# Patient Record
Sex: Female | Born: 1985 | Race: Black or African American | Hispanic: No | Marital: Married | Smoking: Never smoker
Health system: Southern US, Community
[De-identification: ages and names within clinical notes are randomized; demographics above are authoritative.]

## PROBLEM LIST (undated history)

## (undated) ENCOUNTER — Inpatient Hospital Stay (HOSPITAL_COMMUNITY): Payer: Self-pay

## (undated) DIAGNOSIS — A4902 Methicillin resistant Staphylococcus aureus infection, unspecified site: Secondary | ICD-10-CM

## (undated) DIAGNOSIS — F419 Anxiety disorder, unspecified: Secondary | ICD-10-CM

## (undated) DIAGNOSIS — F1920 Other psychoactive substance dependence, uncomplicated: Secondary | ICD-10-CM

## (undated) DIAGNOSIS — N83209 Unspecified ovarian cyst, unspecified side: Secondary | ICD-10-CM

## (undated) DIAGNOSIS — N200 Calculus of kidney: Secondary | ICD-10-CM

## (undated) DIAGNOSIS — K759 Inflammatory liver disease, unspecified: Secondary | ICD-10-CM

## (undated) HISTORY — PX: OTHER SURGICAL HISTORY: SHX169

## (undated) HISTORY — PX: TOOTH EXTRACTION: SUR596

## (undated) HISTORY — DX: Other psychoactive substance dependence, uncomplicated: F19.20

---

## 2015-08-24 ENCOUNTER — Emergency Department (HOSPITAL_COMMUNITY)
Admission: EM | Admit: 2015-08-24 | Discharge: 2015-08-24 | Disposition: A | Discharge status: Home / Self Care | Payer: Medicaid - Out of State | Attending: Emergency Medicine | Admitting: Emergency Medicine

## 2015-08-24 ENCOUNTER — Emergency Department (HOSPITAL_COMMUNITY): Payer: Medicaid - Out of State

## 2015-08-24 ENCOUNTER — Encounter (HOSPITAL_COMMUNITY): Payer: Self-pay

## 2015-08-24 DIAGNOSIS — R740 Nonspecific elevation of levels of transaminase and lactic acid dehydrogenase [LDH]: Secondary | ICD-10-CM | POA: Insufficient documentation

## 2015-08-24 DIAGNOSIS — Z3202 Encounter for pregnancy test, result negative: Secondary | ICD-10-CM | POA: Diagnosis not present

## 2015-08-24 DIAGNOSIS — R1011 Right upper quadrant pain: Secondary | ICD-10-CM | POA: Diagnosis present

## 2015-08-24 DIAGNOSIS — R350 Frequency of micturition: Secondary | ICD-10-CM | POA: Diagnosis not present

## 2015-08-24 DIAGNOSIS — Z79899 Other long term (current) drug therapy: Secondary | ICD-10-CM | POA: Diagnosis not present

## 2015-08-24 DIAGNOSIS — R7401 Elevation of levels of liver transaminase levels: Secondary | ICD-10-CM

## 2015-08-24 DIAGNOSIS — R197 Diarrhea, unspecified: Secondary | ICD-10-CM | POA: Insufficient documentation

## 2015-08-24 DIAGNOSIS — R109 Unspecified abdominal pain: Secondary | ICD-10-CM

## 2015-08-24 LAB — I-STAT BETA HCG BLOOD, ED (MC, WL, AP ONLY): I-stat hCG, quantitative: <5 m[IU]/mL (ref <5)

## 2015-08-24 LAB — CBC
HEMATOCRIT: 39.4 % (ref 36.0–46.0)
HEMOGLOBIN: 13.5 g/dL (ref 12.0–15.0)
MCH: 30.4 pg (ref 26.0–34.0)
MCHC: 34.3 g/dL (ref 30.0–36.0)
MCV: 88.7 fL (ref 78.0–100.0)
Platelets: 207 10*3/uL (ref 150–400)
RBC: 4.44 MIL/uL (ref 3.87–5.11)
RDW: 13.5 % (ref 11.5–15.5)
WBC: 12.4 10*3/uL — ABNORMAL HIGH (ref 4.0–10.5)

## 2015-08-24 LAB — URINALYSIS, ROUTINE W REFLEX MICROSCOPIC
Bilirubin Urine: NEGATIVE
GLUCOSE, UA: NEGATIVE mg/dL
HGB URINE DIPSTICK: NEGATIVE
Ketones, ur: 15 mg/dL — AB
Leukocytes, UA: NEGATIVE
Nitrite: NEGATIVE
PH: 5 (ref 5.0–8.0)
Protein, ur: NEGATIVE mg/dL
SPECIFIC GRAVITY, URINE: 1.025 (ref 1.005–1.030)

## 2015-08-24 LAB — WET PREP, GENITAL
CLUE CELLS WET PREP: NONE SEEN
SPERM: NONE SEEN
TRICH WET PREP: NONE SEEN
Yeast Wet Prep HPF POC: NONE SEEN

## 2015-08-24 LAB — COMPREHENSIVE METABOLIC PANEL
ALT: 187 U/L — AB (ref 14–54)
ANION GAP: 12 (ref 5–15)
AST: 81 U/L — ABNORMAL HIGH (ref 15–41)
Albumin: 4.4 g/dL (ref 3.5–5.0)
Alkaline Phosphatase: 87 U/L (ref 38–126)
BUN: 8 mg/dL (ref 6–20)
CHLORIDE: 106 mmol/L (ref 101–111)
CO2: 22 mmol/L (ref 22–32)
CREATININE: 0.76 mg/dL (ref 0.44–1.00)
Calcium: 9.7 mg/dL (ref 8.9–10.3)
GFR calc non Af Amer: >60 mL/min (ref >60)
Glucose, Bld: 126 mg/dL — ABNORMAL HIGH (ref 65–99)
Potassium: 3.7 mmol/L (ref 3.5–5.1)
SODIUM: 140 mmol/L (ref 135–145)
Total Bilirubin: 0.7 mg/dL (ref 0.3–1.2)
Total Protein: 8.2 g/dL — ABNORMAL HIGH (ref 6.5–8.1)

## 2015-08-24 LAB — LIPASE, BLOOD: LIPASE: 24 U/L (ref 11–51)

## 2015-08-24 MED ORDER — MORPHINE SULFATE (PF) 2 MG/ML IV SOLN
2.0000 mg | Freq: Once | INTRAVENOUS | Status: AC
  Administered 2015-08-24: 2 mg via INTRAVENOUS
  Filled 2015-08-24: qty 1

## 2015-08-24 MED ORDER — IOPAMIDOL (ISOVUE-300) INJECTION 61%
INTRAVENOUS | Status: AC
  Administered 2015-08-24: 100 mL
  Filled 2015-08-24: qty 100

## 2015-08-24 MED ORDER — MORPHINE SULFATE (PF) 4 MG/ML IV SOLN
4.0000 mg | Freq: Once | INTRAVENOUS | Status: AC
  Administered 2015-08-24: 4 mg via INTRAVENOUS
  Filled 2015-08-24: qty 1

## 2015-08-24 MED ORDER — ONDANSETRON HCL 4 MG/2ML IJ SOLN
4.0000 mg | Freq: Once | INTRAMUSCULAR | Status: AC
  Administered 2015-08-24: 4 mg via INTRAVENOUS
  Filled 2015-08-24: qty 2

## 2015-08-24 MED ORDER — OXYCODONE-ACETAMINOPHEN 5-325 MG PO TABS
1.0000 | ORAL_TABLET | ORAL | Status: DC | PRN
  Administered 2015-08-24: 1 via ORAL

## 2015-08-24 MED ORDER — OXYCODONE-ACETAMINOPHEN 5-325 MG PO TABS
ORAL_TABLET | ORAL | Status: AC
  Filled 2015-08-24: qty 1

## 2015-08-24 MED ORDER — ONDANSETRON 4 MG PO TBDP: 4.0000 mg | ORAL_TABLET | Freq: Three times a day (TID) | ORAL | Status: DC | PRN

## 2015-08-24 NOTE — ED Notes (Signed)
Pt moaning in waiting room and advised Rad Tech "she was going to die".  Went and talked to pt and she ask how much longer.  Pt alert sitting in wheelchair.

## 2015-08-24 NOTE — Discharge Instructions (Signed)
Take zofran as needed for nausea. Increase hydration. Ibuprofen for pain.  Please follow up with your primary care provider in 1 week for a re-check of your liver labs. You can also call the clinic listed today or first thing in the morning to schedule a follow up appointment on this lab work.   Please seek immediate care if you develop any of the following symptoms: The pain does not go away.  You have a fever.  You keep throwing up (vomiting) despite nausea medicine. You pass bloody or black tarry stools.  There is bright red blood in the stool.  The constipation stays for more than 4 days.  There is rectal pain.  You do not seem to be getting better.  You have any questions or concerns.

## 2015-08-24 NOTE — ED Notes (Signed)
Pt vomiting in waiting room.  Pt threw up Percocet that she was just given.

## 2015-08-24 NOTE — ED Notes (Signed)
Patient here with lower abdominal pain x 1 day. Reports multiple episodes of vomiting with same. Denies diarrhea. Tearful on arrival

## 2015-08-24 NOTE — ED Provider Notes (Signed)
CSN: [id number]     Arrival date & time 08/24/15  1033 History   First MD Initiated Contact with Patient 08/24/15 1139     Chief Complaint  Patient presents with  . Abdominal Pain    (Consider location/radiation/quality/duration/timing/severity/associated sxs/prior Treatment) The history is provided by the patient and medical records. No language interpreter was used.   Carey Lafon is a 30 y.o. female who presents to the Emergency Department complaining of abdominal pain x 3 days which began worsening this morning. Pain described as sharp, stabbing. Worse in RUQ and LLQ but pain over all abdomen. Ibuprofen - one three times a day x 3 days with little relief of pain. + nausea x 3 days, + vomiting starting today (states NBNB emesis >5 times today). Watery stool once while in ED - no blood. No dysuria, + urinary frequency. ETOH use occasionally, not daily.   History reviewed. No pertinent past medical history. History reviewed. No pertinent past surgical history. No family history on file. Social History  Substance Use Topics  . Smoking status: Never Smoker   . Smokeless tobacco: None  . Alcohol Use: None   OB History    No data available     Review of Systems  Constitutional: Negative for fever and chills.  HENT: Negative for congestion.   Eyes: Negative for visual disturbance.  Respiratory: Negative for cough and shortness of breath.   Cardiovascular: Negative.   Gastrointestinal: Positive for nausea, vomiting, abdominal pain and diarrhea (One loose stool today). Negative for blood in stool.  Genitourinary: Positive for frequency. Negative for dysuria.  Musculoskeletal: Negative for myalgias and back pain.  Skin: Negative for rash.  Neurological: Negative for dizziness and headaches.      Allergies  Review of patient's allergies indicates no known allergies.  Home Medications   Prior to Admission medications   Medication Sig Start Date End Date Taking? Authorizing  Provider  acyclovir (ZOVIRAX) 400 MG tablet Take 400 mg by mouth 2 (two) times daily.   Yes Historical Provider, MD  FLUoxetine (PROZAC) 40 MG capsule Take 40 mg by mouth 2 (two) times daily.   Yes Historical Provider, MD  ondansetron (ZOFRAN ODT) 4 MG disintegrating tablet Take 1 tablet (4 mg total) by mouth every 8 (eight) hours as needed for nausea or vomiting. 08/24/15   Chase Picket Ward, PA-C  PRESCRIPTION MEDICATION Take 1 tablet by mouth daily. The patient stated that her mother mails a blood thinner to her and she is not sure of the name of it. She doesn't know where her mother gets it from. She takes it to ensure that she doesn't get another cyst per patient.   Yes Historical Provider, MD   BP 125/66 mmHg  Pulse 78  Temp(Src) 98.4 F (36.9 C) (Oral)  Resp 22  Ht  (1.626 m)  Wt 84.823 kg  BMI 32.08 kg/m2  SpO2 98%  LMP 08/07/2015 Physical Exam  Constitutional: She is oriented to person, place, and time. She appears well-developed and well-nourished. No distress.  HENT:  Head: Normocephalic and atraumatic.  Cardiovascular: Normal rate, regular rhythm, normal heart sounds and intact distal pulses.  Exam reveals no gallop and no friction rub.   No murmur heard. Pulmonary/Chest: Effort normal and breath sounds normal. No respiratory distress. She has no wheezes. She has no rales. She exhibits no tenderness.  Abdominal: Soft. Bowel sounds are normal. She exhibits no distension and no mass. There is tenderness. There is positive Murphy's sign.  Generalized TTP,  worst in RUQ. No CVA tenderness.   Genitourinary:  Chaperone present for exam. No rashes, lesions, or tenderness to external genitalia. No erythema, injury, or tenderness to vaginal mucosa. No vaginal discharge or bleeding within vaginal vault. No adnexal masses, tenderness, or fullness. No CMT or discharge from cervical os.  Musculoskeletal: She exhibits no edema.  Neurological: She is alert and oriented to person, place,  and time.  Skin: Skin is warm and dry.  Nursing note and vitals reviewed.   ED Course  Procedures (including critical care time) Labs Review Labs Reviewed  WET PREP, GENITAL - Abnormal; Notable for the following:    WBC, Wet Prep HPF POC FEW (*)    All other components within normal limits  COMPREHENSIVE METABOLIC PANEL - Abnormal; Notable for the following:    Glucose, Bld 126 (*)    Total Protein 8.2 (*)    AST 81 (*)    ALT 187 (*)    All other components within normal limits  CBC - Abnormal; Notable for the following:    WBC 12.4 (*)    All other components within normal limits  URINALYSIS, ROUTINE W REFLEX MICROSCOPIC (NOT AT [facility]) - Abnormal; Notable for the following:    APPearance HAZY (*)    Ketones, ur 15 (*)    All other components within normal limits  LIPASE, BLOOD  I-STAT BETA HCG BLOOD, ED ([facility], [facility], [facility] ONLY)  GC/CHLAMYDIA PROBE AMP ([place]) NOT AT [facility]    Imaging Review [place] Abdomen Complete  08/24/2015  CLINICAL DATA:  Abdominal pain for 1 day with vomiting. EXAM: ABDOMEN ULTRASOUND COMPLETE COMPARISON:  None. FINDINGS: Study slightly limited by patient body habitus. Gallbladder: No gallstones seen. There is no gallbladder wall thickening, pericholecystic fluid or other secondary sonographic signs of acute cholecystitis. Common bile duct: Diameter: Normal ranging from 3-6 mm. Liver: No focal lesion identified. Within normal limits in parenchymal echogenicity. IVC: No abnormality visualized. Pancreas: Visualized portion unremarkable. Spleen: Size and appearance within normal limits. Right Kidney: Length: 10.6 cm. Echogenicity within normal limits. No mass or hydronephrosis visualized. Left Kidney: Length: 11.1 cm. Echogenicity within normal limits. No mass or hydronephrosis visualized. Abdominal aorta: No aneurysm visualized. Other findings: None. IMPRESSION: Normal abdomen ultrasound. Electronically Signed   By: Bary RichardStan  Maynard M.D.   On: 08/24/2015 13:51   Ct  Abdomen Pelvis W Contrast  08/24/2015  CLINICAL DATA:  Lower abdominal pain and vomiting for 3 days. EXAM: CT ABDOMEN AND PELVIS WITH CONTRAST TECHNIQUE: Multidetector CT imaging of the abdomen and pelvis was performed using the standard protocol following bolus administration of intravenous contrast. CONTRAST:  100mL ISOVUE-300 IOPAMIDOL (ISOVUE-300) INJECTION 61% COMPARISON:  None. FINDINGS: Lower chest: Lung bases are clear. Hepatobiliary: No focal hepatic lesion. No biliary duct dilatation. Gallbladder is normal. Common bile duct is normal. Pancreas: Pancreas is normal. No ductal dilatation. No pancreatic inflammation. Spleen: Normal spleen Adrenals/urinary tract: Adrenal glands and kidneys are normal. The ureters and bladder normal. Stomach/Bowel: Stomach, small bowel, appendix, and cecum are normal. The colon and rectosigmoid colon are normal. Vascular/Lymphatic: Abdominal aorta is normal caliber. There is no retroperitoneal or periportal lymphadenopathy. No pelvic lymphadenopathy. Reproductive: Uterus and ovaries are normal. Other: No free fluid. Musculoskeletal: No aggressive osseous lesion. IMPRESSION: 1. No acute abdominal or pelvic findings. 2. Normal appendix. Electronically Signed   By: Genevive BiStewart  Edmunds M.D.   On: 08/24/2015 14:54   I have personally reviewed and evaluated these images and lab results as part of my medical decision-making.  EKG Interpretation None      MDM   Final diagnoses:  Abdominal pain  Elevated AST (SGOT)  Elevated alanine aminotransferase (ALT) level   [name] presents with abdominal pain x 1 day. + nausea/vomiting. One loose stool today. On exam, patient is afebrile, VSS. Generalized TTP but much worse at RUQ.   Labs: CBC with white count of 12.4, CMP with ast of 81, ALT 187; UA, hcg, and lipase reassuring. Imaging: Complete abdominal ultrasound normal; CT abdomen normal.   Therapeutics: pain and nausea control while in ED  Patient is nontoxic,  nonseptic appearing, in no apparent distress. Patient's pain and other symptoms adequately managed in emergency department. Labs, imaging and vitals reviewed again prior to arrival. Patient does not meet the SIRS or Sepsis criteria. On repeat exam patient does not have a surgical abdomin and there are no peritoneal signs. No indication of appendicitis, bowel obstruction, bowel perforation, cholecystitis, diverticulitis, PID or ectopic pregnancy. Patient discharged home with symptomatic treatment including zofran for nausea and given strict instructions for follow-up with their primary care physician. Patient informed she needs to follow up for recheck of AST/ALT. I have also discussed reasons to return immediately to the ER. Patient expresses understanding and agrees with plan.  [facility] Ward, PA-C 08/24/15 1741  Lavera Guise, MD 08/24/15 [phone]

## 2015-08-25 LAB — GC/CHLAMYDIA PROBE AMP (~~LOC~~) NOT AT ARMC
Chlamydia: NEGATIVE
Neisseria Gonorrhea: NEGATIVE

## 2015-10-19 ENCOUNTER — Ambulatory Visit (INDEPENDENT_AMBULATORY_CARE_PROVIDER_SITE_OTHER): Payer: Managed Care, Other (non HMO) | Admitting: Urgent Care

## 2015-10-19 VITALS — BP 126/88 | HR 70 | Temp 98.1°F | Resp 18 | Ht 64.0 in | Wt 249.0 lb

## 2015-10-19 DIAGNOSIS — F329 Major depressive disorder, single episode, unspecified: Secondary | ICD-10-CM

## 2015-10-19 DIAGNOSIS — A6004 Herpesviral vulvovaginitis: Secondary | ICD-10-CM

## 2015-10-19 DIAGNOSIS — A6000 Herpesviral infection of urogenital system, unspecified: Secondary | ICD-10-CM | POA: Diagnosis not present

## 2015-10-19 DIAGNOSIS — F32A Depression, unspecified: Secondary | ICD-10-CM

## 2015-10-19 MED ORDER — ACYCLOVIR 400 MG PO TABS: 400.0000 mg | ORAL_TABLET | Freq: Two times a day (BID) | ORAL | Status: DC

## 2015-10-19 MED ORDER — FLUOXETINE HCL 40 MG PO CAPS: 40.0000 mg | ORAL_CAPSULE | Freq: Two times a day (BID) | ORAL | Status: DC

## 2015-10-19 MED ORDER — VALACYCLOVIR HCL 1 G PO TABS: 1000.0000 mg | ORAL_TABLET | Freq: Every day | ORAL | Status: DC

## 2015-10-19 NOTE — Progress Notes (Signed)
    MRN: [id number] DOB: 09-30-1985  Subjective:   Abigail Fox is a 30 y.o. 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 Reports that this outbreak started ~1 week ago. Has always used acyclovir with good results, has used it ~10 years. Would like a refill for this.   Depression - Reports that she has been out of Prozac. Has had this diagnosis since she was 30 y/o. She has been out of her Prozac for weeks now since she has not yet established care. Admits irritability, being tearful and having a difficult time with transitioning/moving to a new city from [place]. She is also not yet living with her husband due to his work obligations. She would like a refill of her Prozac today, denies SI, HI.  Abigail Fox has a current medication list which includes the following prescription(s): acyclovir, fluoxetine, ondansetron, and PRESCRIPTION MEDICATION. Also has No Known Allergies.  Abigail Fox  has no past medical history on file. Also  has no past surgical history on file.  Objective:   Vitals: BP 126/88 mmHg  Pulse 70  Temp(Src) 98.1 F (36.7 C) (Oral)  Resp 18  Ht 5\' 4"  (1.626 m)  Wt 249 lb (112.946 kg)  BMI 42.72 kg/m2  SpO2 97%  LMP 10/17/2015  Physical Exam  Constitutional: She is oriented to person, place, and time. She appears well-developed and well-nourished.  HENT:  Mouth/Throat: Oropharynx is clear and moist.  Eyes: No scleral icterus.  Neck: Normal range of motion. Neck supple. No thyromegaly present.  Cardiovascular: Normal rate, regular rhythm and intact distal pulses.  Exam reveals no gallop and no friction rub.   No murmur heard. Pulmonary/Chest: No respiratory distress. She has no wheezes. She has no rales.  Neurological: She is alert and oriented to person, place, and time.  Skin: Skin is warm and dry.  Psychiatric: She is agitated. She exhibits a depressed mood. She expresses  no homicidal and no suicidal ideation.   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  3. Depression - Refilled Prozac, recheck LFTs in 1 month.  Patient requested getting started with a physical exam for management of her medications, history of Hepatitis C, headaches. I recommended she return to clinic for establishing care. She agreed.  Wallis BambergMario Phoenix Riesen, PA-C [facility] [phone] 10/19/2015 5:46 PM

## 2015-10-19 NOTE — Patient Instructions (Addendum)
For better protection of your health information, contact [facility].  For gynecologic care you may contact the [facility] or [facility].  Please schedule an annual physical exam on the same day that you would like to have it done.    IF you received an x-ray today, you will receive an invoice from [facility]. Please contact [facility] at [phone] with questions or concerns regarding your invoice.   IF you received labwork today, you will receive an invoice from [facility]. Please contact [facility] at [phone] with questions or concerns regarding your invoice.   Our billing staff will not be able to assist you with questions regarding bills from these companies.  You will be contacted with the lab results as soon as they are available. The fastest way to get your results is to activate your My Chart account. Instructions are located on the last page of this paperwork. If you have not heard from us regarding the results in 2 weeks, please contact this office.

## 2016-02-26 ENCOUNTER — Inpatient Hospital Stay (HOSPITAL_COMMUNITY)
Admission: AD | Admit: 2016-02-26 | Discharge: 2016-02-26 | Disposition: A | Discharge status: Home / Self Care | Payer: Medicaid - Out of State | Source: Ambulatory Visit | Attending: Family Medicine | Admitting: Family Medicine

## 2016-02-26 ENCOUNTER — Inpatient Hospital Stay (HOSPITAL_COMMUNITY): Payer: Self-pay

## 2016-02-26 ENCOUNTER — Encounter (HOSPITAL_COMMUNITY): Payer: Self-pay | Admitting: *Deleted

## 2016-02-26 DIAGNOSIS — F1593 Other stimulant use, unspecified with withdrawal: Secondary | ICD-10-CM

## 2016-02-26 DIAGNOSIS — O3481 Maternal care for other abnormalities of pelvic organs, first trimester: Secondary | ICD-10-CM | POA: Insufficient documentation

## 2016-02-26 DIAGNOSIS — N8312 Corpus luteum cyst of left ovary: Secondary | ICD-10-CM | POA: Insufficient documentation

## 2016-02-26 DIAGNOSIS — O209 Hemorrhage in early pregnancy, unspecified: Secondary | ICD-10-CM

## 2016-02-26 DIAGNOSIS — F1123 Opioid dependence with withdrawal: Secondary | ICD-10-CM | POA: Insufficient documentation

## 2016-02-26 DIAGNOSIS — R1012 Left upper quadrant pain: Secondary | ICD-10-CM

## 2016-02-26 DIAGNOSIS — Z3A01 Less than 8 weeks gestation of pregnancy: Secondary | ICD-10-CM | POA: Insufficient documentation

## 2016-02-26 HISTORY — DX: Anxiety disorder, unspecified: F41.9

## 2016-02-26 LAB — RAPID URINE DRUG SCREEN, HOSP PERFORMED
Amphetamines: NOT DETECTED
Barbiturates: NOT DETECTED
Benzodiazepines: NOT DETECTED
Cocaine: NOT DETECTED
Opiates: POSITIVE — AB
Tetrahydrocannabinol: POSITIVE — AB

## 2016-02-26 LAB — COMPREHENSIVE METABOLIC PANEL
ALT: 50 U/L (ref 14–54)
AST: 34 U/L (ref 15–41)
Albumin: 4.1 g/dL (ref 3.5–5.0)
Alkaline Phosphatase: 75 U/L (ref 38–126)
Anion gap: 10 (ref 5–15)
BUN: 9 mg/dL (ref 6–20)
CO2: 21 mmol/L — ABNORMAL LOW (ref 22–32)
Calcium: 10.1 mg/dL (ref 8.9–10.3)
Chloride: 105 mmol/L (ref 101–111)
Creatinine, Ser: 0.8 mg/dL (ref 0.44–1.00)
GFR calc Af Amer: >60 mL/min (ref >60)
GFR calc non Af Amer: >60 mL/min (ref >60)
Glucose, Bld: 107 mg/dL — ABNORMAL HIGH (ref 65–99)
Potassium: 4 mmol/L (ref 3.5–5.1)
Sodium: 136 mmol/L (ref 135–145)
Total Bilirubin: 0.4 mg/dL (ref 0.3–1.2)
Total Protein: 7.8 g/dL (ref 6.5–8.1)

## 2016-02-26 LAB — URINE MICROSCOPIC-ADD ON

## 2016-02-26 LAB — URINALYSIS, ROUTINE W REFLEX MICROSCOPIC
BILIRUBIN URINE: NEGATIVE
GLUCOSE, UA: NEGATIVE mg/dL
Ketones, ur: NEGATIVE mg/dL
Leukocytes, UA: NEGATIVE
Nitrite: NEGATIVE
Protein, ur: NEGATIVE mg/dL
SPECIFIC GRAVITY, URINE: 1.015 (ref 1.005–1.030)
pH: 6 (ref 5.0–8.0)

## 2016-02-26 LAB — CBC
HCT: 38 % (ref 36.0–46.0)
Hemoglobin: 13.5 g/dL (ref 12.0–15.0)
MCH: 30.8 pg (ref 26.0–34.0)
MCHC: 35.5 g/dL (ref 30.0–36.0)
MCV: 86.6 fL (ref 78.0–100.0)
Platelets: 226 10*3/uL (ref 150–400)
RBC: 4.39 MIL/uL (ref 3.87–5.11)
RDW: 13.4 % (ref 11.5–15.5)
WBC: 16.7 10*3/uL — ABNORMAL HIGH (ref 4.0–10.5)

## 2016-02-26 LAB — POCT PREGNANCY, URINE: PREG TEST UR: POSITIVE — AB

## 2016-02-26 LAB — LIPASE, BLOOD: Lipase: 20 U/L (ref 11–51)

## 2016-02-26 LAB — ABO/RH: ABO/RH(D): O POS

## 2016-02-26 LAB — HCG, QUANTITATIVE, PREGNANCY: hCG, Beta Chain, Quant, S: 12630 m[IU]/mL — ABNORMAL HIGH (ref <5)

## 2016-02-26 MED ORDER — CLONAZEPAM 0.5 MG PO TABS: 0.5000 mg | ORAL_TABLET | Freq: Once | ORAL | 0 refills | Status: DC

## 2016-02-26 MED ORDER — LACTATED RINGERS IV SOLN
INTRAVENOUS | Status: DC
  Administered 2016-02-26: 14:00:00 via INTRAVENOUS

## 2016-02-26 MED ORDER — DICYCLOMINE HCL 20 MG PO TABS: 20.0000 mg | ORAL_TABLET | Freq: Two times a day (BID) | ORAL | 0 refills | Status: DC

## 2016-02-26 MED ORDER — CLONAZEPAM 0.5 MG PO TABS
0.5000 mg | ORAL_TABLET | Freq: Once | ORAL | Status: AC
  Administered 2016-02-26: 0.5 mg via ORAL
  Filled 2016-02-26: qty 1

## 2016-02-26 MED ORDER — ONDANSETRON HCL 4 MG PO TABS: 4.0000 mg | ORAL_TABLET | Freq: Three times a day (TID) | ORAL | 0 refills | Status: DC | PRN

## 2016-02-26 MED ORDER — METOCLOPRAMIDE HCL 5 MG/ML IJ SOLN
10.0000 mg | Freq: Once | INTRAMUSCULAR | Status: AC
  Administered 2016-02-26: 10 mg via INTRAVENOUS
  Filled 2016-02-26: qty 2

## 2016-02-26 MED ORDER — HYDROXYZINE HCL 50 MG/ML IM SOLN
50.0000 mg | Freq: Once | INTRAMUSCULAR | Status: AC
  Administered 2016-02-26: 50 mg via INTRAMUSCULAR
  Filled 2016-02-26: qty 1

## 2016-02-26 MED ORDER — DICYCLOMINE HCL 10 MG/ML IM SOLN
10.0000 mg | Freq: Once | INTRAMUSCULAR | Status: DC
  Filled 2016-02-26: qty 1

## 2016-02-26 MED ORDER — DICYCLOMINE HCL 10 MG PO CAPS
10.0000 mg | ORAL_CAPSULE | Freq: Once | ORAL | Status: AC
  Administered 2016-02-26: 10 mg via ORAL
  Filled 2016-02-26: qty 1

## 2016-02-26 MED ORDER — ONDANSETRON HCL 4 MG/2ML IJ SOLN
4.0000 mg | Freq: Once | INTRAMUSCULAR | Status: AC
  Administered 2016-02-26: 4 mg via INTRAVENOUS
  Filled 2016-02-26: qty 2

## 2016-02-26 NOTE — BH Assessment (Signed)
Tele Assessment Note   Abigail AcostaChelsea Fox is an 30 y.o. 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 She states that she stopped taking her medication of 8mg  of suboxone 2x/day as soon as she found out she was pregnant.  Pt denies SI, HI, AVH, history of violence or suicide attempts or current SA and says she has been taking suboxone since March with Dr. Pleas [facility] [place]. 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 She states that she has 3 step-children who do not live with her.  MSE: Pt is dressed in scrubs, alert, oriented x4 with loud speech and restless motor behavior. Eye contact is good. Pt's mood is anxious and affect is congruent with mood. Thought process is coherent and relevant. There is no indication Pt is currently responding to internal stimuli or experiencing delusional thought content. Pt was cooperative but restless throughout assessment.  Fransisca KaufmannLaura Davis, NP, recommends follow up with OP MD re: suboxone treatment, and states that pt does not meet IP for psych criteria at this time. Spoke with Eveline KetoKathy Harris, NP, who agrees to contact OP therapist and follow up.    Diagnosis:  Past Medical History:  Past Medical History:  Diagnosis Date  . Anxiety   . Depression     Past Surgical History:  Procedure Laterality Date  . TOOTH EXTRACTION      Family History: History reviewed. No pertinent family history.  Social History:  reports that she has never smoked. She has never used smokeless tobacco. She reports that she uses drugs, including Marijuana. She reports that she does not drink alcohol.  Additional Social History:  Alcohol / Drug Use Pain Medications: denies abusing Prescriptions: denies abusing Over the Counter: denies abusing History of alcohol / drug use?: Yes (on suboxone) Longest period of sobriety (when/how long):  (UTA) Negative Consequences of Use:   (denies) Withdrawal Symptoms: Agitation, Cramps, Nausea / Vomiting, Irritability  CIWA: CIWA-Ar BP: 141/83 Pulse Rate: 87 COWS:    PATIENT STRENGTHS: (choose at least two) Ability for insight Average or above average intelligence Capable of independent living Communication skills General fund of knowledge Motivation for treatment/growth Supportive family/friends Work skills  Allergies: No Known Allergies  Home Medications:  Medications Prior to Admission  Medication Sig Dispense Refill  . acyclovir (ZOVIRAX) 400 MG tablet Take 1 tablet (400 mg total) by mouth 2 (two) times daily. 60 tablet 6  . FLUoxetine (PROZAC) 40 MG capsule Take 1 capsule (40 mg total) by mouth 2 (two) times daily. 60 capsule 5  . ondansetron (ZOFRAN ODT) 4 MG disintegrating tablet Take 1 tablet (4 mg total) by mouth every 8 (eight) hours as needed for nausea or vomiting. (Patient not taking: Reported on 10/19/2015) 10 tablet 0  . PRESCRIPTION MEDICATION Take 1 tablet by mouth daily. Reported on 10/19/2015    . valACYclovir (VALTREX) 1000 MG tablet Take 1 tablet (1,000 mg total) by mouth daily. 30 tablet 11    OB/GYN Status:  Patient's last menstrual period was 01/12/2016 (approximate).  General Assessment Data Location of Assessment: [facility] Assessment: In system Is this a Tele or Face-to-Face Assessment?: Tele Assessment Is this an Initial Assessment or a Re-assessment for this encounter?: Initial Assessment Marital status: Married Is patient pregnant?: Yes Pregnancy Status: Yes (Comment: include estimated delivery date) Living Arrangements: Spouse/significant other Can pt return to current living arrangement?: Yes Admission Status: Voluntary Is patient capable of signing voluntary admission?:  Yes Referral Source: MD Insurance type: UNK     Crisis Care Plan Living Arrangements: Spouse/significant other Name of Psychiatrist: Dr. Pleas KochPlumber Name of Therapist: [facility]  Education Status Is  patient currently in school?: No  Risk to self with the past 6 months Suicidal Ideation: No Has patient been a risk to self within the past 6 months prior to admission? : No Suicidal Intent: No Has patient had any suicidal intent within the past 6 months prior to admission? : No Is patient at risk for suicide?: No Suicidal Plan?: No Has patient had any suicidal plan within the past 6 months prior to admission? : No Access to Means: No Previous Attempts/Gestures: No Intentional Self Injurious Behavior: None Family Suicide History: Unknown Recent stressful life event(s):  (found out she was pregnant) Depression: No Substance abuse history and/or treatment for substance abuse?: No  Risk to Others within the past 6 months Homicidal Ideation: No Does patient have any lifetime risk of violence toward others beyond the six months prior to admission? : No Thoughts of Harm to Others: No Current Homicidal Intent: No Current Homicidal Plan: No Access to Homicidal Means: No History of harm to others?: No Assessment of Violence: None Noted Does patient have access to weapons?: No Criminal Charges Pending?: No Does patient have a court date: No Is patient on probation?: No  Psychosis Hallucinations: None noted Delusions: None noted  Mental Status Report Appearance/Hygiene: In hospital gown Eye Contact: Good Motor Activity: Agitation, Restlessness Speech: Logical/coherent Level of Consciousness: Irritable, Restless Mood: Irritable, Anxious Affect: Irritable, Anxious Anxiety Level: Severe Thought Processes: Coherent, Relevant Judgement: Partial Orientation: Person, Place, Time, Situation, Appropriate for developmental age Obsessive Compulsive Thoughts/Behaviors: Minimal  Cognitive Functioning Concentration: Poor Memory: Recent Intact, Remote Intact IQ: Average Insight: Fair Impulse Control: Fair Appetite: Poor Weight Loss: 0 Weight Gain: 0 Sleep: Decreased Total Hours of  Sleep:  (UTA) Vegetative Symptoms: Unable to Assess  ADLScreening [facility]) Patient's cognitive ability adequate to safely complete daily activities?: Yes Patient able to express need for assistance with ADLs?: Yes Independently performs ADLs?: Yes (appropriate for developmental age)  Prior Inpatient Therapy Prior Inpatient Therapy: No  Prior Outpatient Therapy Prior Outpatient Therapy: Yes Prior Therapy Dates: since March Prior Therapy Facilty/Provider(s): Dr. Pleas KochPlumber Reason for Treatment: suboxone Does patient have an ACCT team?: No Does patient have Intensive In-House Services?  : No Does patient have [facility] services? : No Does patient have P4CC services?: No  ADL Screening (condition at time of admission) Patient's cognitive ability adequate to safely complete daily activities?: Yes Is the patient deaf or have difficulty hearing?: No Does the patient have difficulty seeing, even when wearing glasses/contacts?: No Does the patient have difficulty concentrating, remembering, or making decisions?: No Patient able to express need for assistance with ADLs?: Yes Does the patient have difficulty dressing or bathing?: No Independently performs ADLs?: Yes (appropriate for developmental age) Does the patient have difficulty walking or climbing stairs?: No Weakness of Legs: None Weakness of Arms/Hands: None  Home Assistive Devices/Equipment Home Assistive Devices/Equipment: None  Therapy Consults (therapy consults require a physician order) PT Evaluation Needed: No OT Evalulation Needed: No SLP Evaluation Needed: No Abuse/Neglect Assessment (Assessment to be complete while patient is alone) Physical Abuse: Denies Verbal Abuse: Denies Sexual Abuse: Denies Exploitation of patient/patient's resources: Denies Self-Neglect: Denies Values / Beliefs Cultural Requests During Hospitalization: None Spiritual Requests During Hospitalization: None Consults Spiritual  Care Consult Needed: No Social Work Consult Needed: No Merchant navy officerAdvance Directives (For Healthcare) Does  patient have an advance directive?: No Would patient like information on creating an advanced directive?: No - patient declined information Nutrition Screen- [facility] Patient's home diet: Regular  Additional Information 1:1 In Past 12 Months?: No CIRT Risk: No Elopement Risk: No Does patient have medical clearance?: Yes     Disposition:  Disposition Initial Assessment Completed for this Encounter: Yes Disposition of Patient: Outpatient treatment  Kanishk Stroebel [facility] 02/26/2016 3:47 PM

## 2016-02-26 NOTE — MAU Provider Note (Signed)
History     CSN: [id number]  Arrival date and time: 02/26/16 1233   First Provider Initiated Contact with Patient 02/26/16 1252      Chief Complaint  Patient presents with  . Abdominal Pain  . Possible Pregnancy  . Emesis   HPI Abigail Fox is a 30 y.o. G1P0 at 7275w3d. She presents via EMS for severe abd pain and nausea and vomiting. She woke up at 3 am with LUQ abd pain, she had been vomiting off/on since. The pain is sharp, constant. No urinary changes, no diarrhea or constipation. She has spotting with wiping, no change in discharge, odor or itching. She is constantly moving her legs, inconsolable.    OB History    Gravida Para Term Preterm AB Living   1             SAB TAB Ectopic Multiple Live Births                  Past Medical History:  Diagnosis Date  . Anxiety   . Depression     Past Surgical History:  Procedure Laterality Date  . TOOTH EXTRACTION      History reviewed. No pertinent family history.  Social History  Substance Use Topics  . Smoking status: Never Smoker  . Smokeless tobacco: Never Used  . Alcohol use No    Allergies: No Known Allergies  Prescriptions Prior to Admission  Medication Sig Dispense Refill Last Dose  . acyclovir (ZOVIRAX) 400 MG tablet Take 1 tablet (400 mg total) by mouth 2 (two) times daily. 60 tablet 6   . FLUoxetine (PROZAC) 40 MG capsule Take 1 capsule (40 mg total) by mouth 2 (two) times daily. 60 capsule 5   . ondansetron (ZOFRAN ODT) 4 MG disintegrating tablet Take 1 tablet (4 mg total) by mouth every 8 (eight) hours as needed for nausea or vomiting. (Patient not taking: Reported on 10/19/2015) 10 tablet 0 Not Taking  . PRESCRIPTION MEDICATION Take 1 tablet by mouth daily. Reported on 10/19/2015   Not Taking  . valACYclovir (VALTREX) 1000 MG tablet Take 1 tablet (1,000 mg total) by mouth daily. 30 tablet 11     Review of Systems  Constitutional: Negative for chills and fever.  Gastrointestinal: Positive for  abdominal pain, nausea and vomiting. Negative for constipation and diarrhea.  Genitourinary: Negative for dysuria, frequency and urgency.   Physical Exam   Blood pressure 141/83, pulse 87, temperature 97.4 F (36.3 C), temperature source Oral, resp. rate 20, height 5' 3.5" (1.613 m), weight 241 lb (109.3 kg), last menstrual period 01/12/2016.  Physical Exam  MAU Course  Procedures  MDM I was at bedside to help with IV start, pt admits to stopping her Suboxone a few days ago, doesn't want to hurt the baby. Requests opioid or suboxone dose. Consulted with Dr Adrian BlackwaterStinson ~ 1:45.   Results for orders placed or performed during the hospital encounter of 02/26/16 (from the past 24 hour(s))  Urine rapid drug screen (hosp performed)     Status: Abnormal   Collection Time: 02/26/16 12:33 PM  Result Value Ref Range   Opiates POSITIVE (A) NONE DETECTED   Cocaine NONE DETECTED NONE DETECTED   Benzodiazepines NONE DETECTED NONE DETECTED   Amphetamines NONE DETECTED NONE DETECTED   Tetrahydrocannabinol POSITIVE (A) NONE DETECTED   Barbiturates NONE DETECTED NONE DETECTED  Urinalysis, Routine w reflex microscopic (not [facility])     Status: Abnormal   Collection Time: 02/26/16 12:46 PM  Result  Value Ref Range   Color, Urine YELLOW YELLOW   APPearance CLEAR CLEAR   Specific Gravity, Urine 1.015 1.005 - 1.030   pH 6.0 5.0 - 8.0   Glucose, UA NEGATIVE NEGATIVE mg/dL   Hgb urine dipstick TRACE (A) NEGATIVE   Bilirubin Urine NEGATIVE NEGATIVE   Ketones, ur NEGATIVE NEGATIVE mg/dL   Protein, ur NEGATIVE NEGATIVE mg/dL   Nitrite NEGATIVE NEGATIVE   Leukocytes, UA NEGATIVE NEGATIVE  Urine microscopic-add on     Status: Abnormal   Collection Time: 02/26/16 12:46 PM  Result Value Ref Range   Squamous Epithelial / LPF 0-5 (A) NONE SEEN   WBC, UA 0-5 0 - 5 WBC/hpf   RBC / HPF 0-5 0 - 5 RBC/hpf   Bacteria, UA FEW (A) NONE SEEN  Pregnancy, urine POC     Status: Abnormal   Collection Time: 02/26/16 12:52  PM  Result Value Ref Range   Preg Test, Ur POSITIVE (A) NEGATIVE  CBC     Status: Abnormal   Collection Time: 02/26/16  1:16 PM  Result Value Ref Range   WBC 16.7 (H) 4.0 - 10.5 K/uL   RBC 4.39 3.87 - 5.11 MIL/uL   Hemoglobin 13.5 12.0 - 15.0 g/dL   HCT 16.1 09.6 - 04.5 %   MCV 86.6 78.0 - 100.0 fL   MCH 30.8 26.0 - 34.0 pg   MCHC 35.5 30.0 - 36.0 g/dL   RDW 40.9 81.1 - 91.4 %   Platelets 226 150 - 400 K/uL  ABO/Rh     Status: None (Preliminary result)   Collection Time: 02/26/16  1:16 PM  Result Value Ref Range   ABO/RH(D) O POS   hCG, quantitative, pregnancy     Status: Abnormal   Collection Time: 02/26/16  1:16 PM  Result Value Ref Range   hCG, Beta Chain, Quant, S 12,630 (H) <5 mIU/mL  Comprehensive metabolic panel     Status: Abnormal   Collection Time: 02/26/16  1:16 PM  Result Value Ref Range   Sodium 136 135 - 145 mmol/L   Potassium 4.0 3.5 - 5.1 mmol/L   Chloride 105 101 - 111 mmol/L   CO2 21 (L) 22 - 32 mmol/L   Glucose, Bld 107 (H) 65 - 99 mg/dL   BUN 9 6 - 20 mg/dL   Creatinine, Ser 7.82 0.44 - 1.00 mg/dL   Calcium 95.6 8.9 - 21.3 mg/dL   Total Protein 7.8 6.5 - 8.1 g/dL   Albumin 4.1 3.5 - 5.0 g/dL   AST 34 15 - 41 U/L   ALT 50 14 - 54 U/L   Alkaline Phosphatase 75 38 - 126 U/L   Total Bilirubin 0.4 0.3 - 1.2 mg/dL   GFR calc non Af Amer >60 >60 mL/min   GFR calc Af Amer >60 >60 mL/min   Anion gap 10 5 - 15  Lipase, blood     Status: None   Collection Time: 02/26/16  1:16 PM  Result Value Ref Range   Lipase 20 11 - 51 U/L    US Ob Comp Less 14 Wks  Result Date: 02/26/2016 CLINICAL DATA:  Pain.  Spotting. EXAM: OBSTETRIC <14 WK [place] AND TRANSVAGINAL OB US TECHNIQUE: Both transabdominal and transvaginal ultrasound examinations were performed for complete evaluation of the gestation as well as the maternal uterus, adnexal regions, and pelvic cul-de-sac. Transvaginal technique was performed to assess early pregnancy. COMPARISON:  None. FINDINGS: Intrauterine  gestational sac: Single Yolk sac:  Visualized. Embryo:  Visualized.  Cardiac Activity: Visualized. Heart Rate: 117  bpm CRL:  5.3  mm   6 w   1 d                  US EDC: 10/22/2016 Subchorionic hemorrhage:  None visualized. Maternal uterus/adnexae: Subchorionic hemorrhage: None Right ovary: Normal containing small corpus luteum Left ovary: Normal Other :None Free fluid:  None IMPRESSION: 1. Single living intrauterine gestation. The estimated gestational age is 6 weeks and 1 day. 2. No complications. Electronically Signed   By: Signa Kellaylor  Stroud M.D.   On: 02/26/2016 14:24   [place] Ob Transvaginal  Result Date: 02/26/2016 CLINICAL DATA:  Pain.  Spotting. EXAM: OBSTETRIC <14 WK US AND TRANSVAGINAL OB US TECHNIQUE: Both transabdominal and transvaginal ultrasound examinations were performed for complete evaluation of the gestation as well as the maternal uterus, adnexal regions, and pelvic cul-de-sac. Transvaginal technique was performed to assess early pregnancy. COMPARISON:  None. FINDINGS: Intrauterine gestational sac: Single Yolk sac:  Visualized. Embryo:  Visualized. Cardiac Activity: Visualized. Heart Rate: 117  bpm CRL:  5.3  mm   6 w   1 d                  US EDC: 10/22/2016 Subchorionic hemorrhage:  None visualized. Maternal uterus/adnexae: Subchorionic hemorrhage: None Right ovary: Normal containing small corpus luteum Left ovary: Normal Other :None Free fluid:  None IMPRESSION: 1. Single living intrauterine gestation. The estimated gestational age is 6 weeks and 1 day. 2. No complications. Electronically Signed   By: Signa Kellaylor  Stroud M.D.   On: 02/26/2016 14:24    Consulted with Dr Adrian BlackwaterStinson, may give Bentyl and Klonopin   I talked with CNA at clinic pt goes to for subutex, recommend give  Bentyl and klonipin until hr appt tomorrow Assessment and Plan   6 1/7 wks IUP, FHR 117, sm CLC Withdrawal from Subutex, she has an appt tomorrow with clinic run by Dr Roselie Awkwardharles Plummer. Bentyl x 5d and Klonopin x4 doses  until pt gets with Dr Oneida AlarPlummer  Orren Pietsch M. 02/26/2016, 1:02 PM

## 2016-02-26 NOTE — MAU Note (Signed)
Pt discharged home without signing discharge, states she just wants to go home now, verbalized all understanding of medications and follow up care

## 2016-02-26 NOTE — MAU Note (Signed)
EMS arrival.  Pain on rt side started during the night, stabbing pain.  Pinkish brown spotting this morning, reports intercourse last night.  Started vomiting this morning. +HPTon Sat.

## 2016-02-29 ENCOUNTER — Encounter (HOSPITAL_COMMUNITY): Payer: Self-pay | Admitting: *Deleted

## 2016-02-29 ENCOUNTER — Inpatient Hospital Stay (HOSPITAL_COMMUNITY): Payer: Self-pay

## 2016-02-29 ENCOUNTER — Inpatient Hospital Stay (HOSPITAL_COMMUNITY)
Admission: AD | Admit: 2016-02-29 | Discharge: 2016-02-29 | Disposition: A | Discharge status: Home / Self Care | Payer: Medicaid - Out of State | Source: Ambulatory Visit | Attending: Obstetrics and Gynecology | Admitting: Obstetrics and Gynecology

## 2016-02-29 DIAGNOSIS — O209 Hemorrhage in early pregnancy, unspecified: Secondary | ICD-10-CM

## 2016-02-29 DIAGNOSIS — O418X10 Other specified disorders of amniotic fluid and membranes, first trimester, not applicable or unspecified: Secondary | ICD-10-CM | POA: Diagnosis present

## 2016-02-29 DIAGNOSIS — Z79899 Other long term (current) drug therapy: Secondary | ICD-10-CM | POA: Insufficient documentation

## 2016-02-29 DIAGNOSIS — Z3A01 Less than 8 weeks gestation of pregnancy: Secondary | ICD-10-CM | POA: Insufficient documentation

## 2016-02-29 DIAGNOSIS — O468X1 Other antepartum hemorrhage, first trimester: Secondary | ICD-10-CM

## 2016-02-29 DIAGNOSIS — O208 Other hemorrhage in early pregnancy: Secondary | ICD-10-CM | POA: Insufficient documentation

## 2016-02-29 LAB — URINALYSIS, ROUTINE W REFLEX MICROSCOPIC
Bilirubin Urine: NEGATIVE
Glucose, UA: NEGATIVE mg/dL
Ketones, ur: NEGATIVE mg/dL
Leukocytes, UA: NEGATIVE
Nitrite: NEGATIVE
PH: 5.5 (ref 5.0–8.0)
Protein, ur: NEGATIVE mg/dL
SPECIFIC GRAVITY, URINE: 1.015 (ref 1.005–1.030)

## 2016-02-29 LAB — URINE MICROSCOPIC-ADD ON

## 2016-02-29 NOTE — MAU Note (Signed)
Pt reports she had some bright red blood when she wiped . Had some cramping earlier today.

## 2016-02-29 NOTE — MAU Provider Note (Signed)
Chief Complaint: Vaginal Bleeding   First Provider Initiated Contact with Patient 02/29/16 1846        SUBJECTIVE HPI: Abigail Fox is a 30 y.o. G1P0 at 5355w6d by LMP who presents to maternity admissions reporting bleeding which has changed from brown/pink to bright red.   Is very concerned about well-being of baby, states "I really want to be a mom!"    Has not scheduled prenatal care yet. Wants to come to clinic here. Has been seen by doctor who prescribes her suboxone, and it was reinitiated.  States "everything is fine now".. She denies vaginal itching/burning, urinary symptoms, h/a, dizziness, n/v, or fever/chills.     Vaginal Bleeding  The patient's primary symptoms include vaginal bleeding. The patient's pertinent negatives include no genital itching, genital lesions, genital odor or pelvic pain. This is a recurrent problem. The current episode started in the past 7 days. The problem occurs intermittently. The problem has been unchanged. The patient is experiencing no pain. She is pregnant. Pertinent negatives include no back pain, chills, constipation, diarrhea, fever, nausea or vomiting. The vaginal discharge was bloody. The vaginal bleeding is spotting. She has not been passing clots. She has not been passing tissue.   RN Note: Pt reports she had some bright red blood when she wiped . Had some cramping earlier today  Past Medical History:  Diagnosis Date  . Anxiety   . Depression    Past Surgical History:  Procedure Laterality Date  . TOOTH EXTRACTION     Social History   Social History  . Marital status: Married    Spouse name: N/A  . Number of children: N/A  . Years of education: N/A   Occupational History  . Not on file.   Social History Main Topics  . Smoking status: Never Smoker  . Smokeless tobacco: Never Used  . Alcohol use No  . Drug use:     Types: Marijuana     Comment: last use 02/24/2016  . Sexual activity: Yes   Other Topics Concern  . Not on file    Social History Narrative  . No narrative on file   No current facility-administered medications on file prior to encounter.    Current Outpatient Prescriptions on File Prior to Encounter  Medication Sig Dispense Refill  . acyclovir (ZOVIRAX) 400 MG tablet Take 1 tablet (400 mg total) by mouth 2 (two) times daily. 60 tablet 6  . clonazePAM (KLONOPIN) 0.5 MG tablet Take 1 tablet (0.5 mg total) by mouth once. 4 tablet 0  . dicyclomine (BENTYL) 20 MG tablet Take 1 tablet (20 mg total) by mouth 2 (two) times daily. 10 tablet 0  . FLUoxetine (PROZAC) 40 MG capsule Take 1 capsule (40 mg total) by mouth 2 (two) times daily. 60 capsule 5  . ondansetron (ZOFRAN) 4 MG tablet Take 1 tablet (4 mg total) by mouth every 8 (eight) hours as needed for nausea or vomiting. 20 tablet 0   No Known Allergies  I have reviewed patient's Past Medical Hx, Surgical Hx, Family Hx, Social Hx, medications and allergies.   ROS:  Review of Systems  Constitutional: Negative for chills and fever.  Gastrointestinal: Negative for constipation, diarrhea, nausea and vomiting.  Genitourinary: Positive for vaginal bleeding. Negative for pelvic pain.  Musculoskeletal: Negative for back pain.   Review of Systems  Other systems negative   Physical Exam  Patient Vitals for the past 24 hrs:  BP Temp Pulse Resp  02/29/16 1830 - 97.8 F (36.6  C) - -  02/29/16 1829 109/73 - 85 18    Physical Exam  Constitutional: Well-developed, well-nourished female in no acute distress.  Cardiovascular: normal rate Respiratory: normal effort GI: Abd soft, non-tender. Pos BS x 4 MS: Extremities nontender, no edema, normal ROM Neurologic: Alert and oriented x 4.  GU: Neg CVAT.  PELVIC EXAM: Deferred    LAB RESULTS No results found for this or any previous visit (from the past 24 hour(s)).  --/--/O POS (11/20 1316)  IMAGING [place] Ob Comp Less 14 Wks  Result Date: 02/26/2016 CLINICAL DATA:  Pain.  Spotting. EXAM: OBSTETRIC  <14 WK US AND TRANSVAGINAL OB US TECHNIQUE: Both transabdominal and transvaginal ultrasound examinations were performed for complete evaluation of the gestation as well as the maternal uterus, adnexal regions, and pelvic cul-de-sac. Transvaginal technique was performed to assess early pregnancy. COMPARISON:  None. FINDINGS: Intrauterine gestational sac: Single Yolk sac:  Visualized. Embryo:  Visualized. Cardiac Activity: Visualized. Heart Rate: 117  bpm CRL:  5.3  mm   6 w   1 d                  US EDC: 10/22/2016 Subchorionic hemorrhage:  None visualized. Maternal uterus/adnexae: Subchorionic hemorrhage: None Right ovary: Normal containing small corpus luteum Left ovary: Normal Other :None Free fluid:  None IMPRESSION: 1. Single living intrauterine gestation. The estimated gestational age is 6 weeks and 1 day. 2. No complications. Electronically Signed   By: Signa Kellaylor  Stroud M.D.   On: 02/26/2016 14:24   [place] Ob Transvaginal  Result Date: 02/26/2016 CLINICAL DATA:  Pain.  Spotting. EXAM: OBSTETRIC <14 WK US AND TRANSVAGINAL OB US TECHNIQUE: Both transabdominal and transvaginal ultrasound examinations were performed for complete evaluation of the gestation as well as the maternal uterus, adnexal regions, and pelvic cul-de-sac. Transvaginal technique was performed to assess early pregnancy. COMPARISON:  None. FINDINGS: Intrauterine gestational sac: Single Yolk sac:  Visualized. Embryo:  Visualized. Cardiac Activity: Visualized. Heart Rate: 117  bpm CRL:  5.3  mm   6 w   1 d                  US EDC: 10/22/2016 Subchorionic hemorrhage:  None visualized. Maternal uterus/adnexae: Subchorionic hemorrhage: None Right ovary: Normal containing small corpus luteum Left ovary: Normal Other :None Free fluid:  None IMPRESSION: 1. Single living intrauterine gestation. The estimated gestational age is 6 weeks and 1 day. 2. No complications. Electronically Signed   By: Signa Kellaylor  Stroud M.D.   On: 02/26/2016 14:24   Repeat US  tonight showed single intrauterine fetus with HR 121 with small subchorionic hemorrhage  MAU Management/MDM: Consulted Dr Emelda FearFerguson Discussed whether US in indicated, given recent one. Since patient is bleeding more heavily and is extremely uneasy with not knowing if she is miscarrying, and due to recent behavior with recent visit, he advises it may be best just to repeat the US for reassurance.  Discussed that she will probably continue to bleed for several days to weeks and that repeated US's would not be indicated. Patient states understanding.   ASSESSMENT [place] IUP at 8863w6d Small subchorionic hemorrhage causing first trimester bleeding Reassuring fetal heart rate  PLAN Discharge home   Pt stable at time of discharge. Encouraged to return here or to other Urgent Care/ED if she develops worsening of symptoms, increase in pain, fever, or other concerning symptoms.    Wynelle BourgeoisMarie Mailee Klaas CNM, MSN Certified Nurse-Midwife 02/29/2016  6:46 PM

## 2016-02-29 NOTE — Discharge Instructions (Signed)
Vaginal Bleeding During Pregnancy, First Trimester A small amount of bleeding (spotting) from the vagina is common in early pregnancy. Sometimes the bleeding is normal and is not a problem, and sometimes it is a sign of something serious. Be sure to tell your doctor about any bleeding from your vagina right away. Follow these instructions at home:  Watch your condition for any changes.  Follow your doctor's instructions about how active you can be.  If you are on bed rest:  You may need to stay in bed and only get up to use the bathroom.  You may be allowed to do some activities.  If you need help, make plans for someone to help you.  Write down:  The number of pads you use each day.  How often you change pads.  How soaked (saturated) your pads are.  Do not use tampons.  Do not douche.  Do not have sex or orgasms until your doctor says it is okay.  If you pass any tissue from your vagina, save the tissue so you can show it to your doctor.  Only take medicines as told by your doctor.  Do not take aspirin because it can make you bleed.  Keep all follow-up visits as told by your doctor. Contact a doctor if:  You bleed from your vagina.  You have cramps.  You have labor pains.  You have a fever that does not go away after you take medicine. Get help right away if:  You have very bad cramps in your back or belly (abdomen).  You pass large clots or tissue from your vagina.  You bleed more.  You feel light-headed or weak.  You pass out (faint).  You have chills.  You are leaking fluid or have a gush of fluid from your vagina.  You pass out while pooping (having a bowel movement). This information is not intended to replace advice given to you by your health care provider. Make sure you discuss any questions you have with your health care provider. Document Released: 08/09/2013 Document Revised: 08/31/2015 Document Reviewed: 11/30/2012 Elsevier Interactive  Patient Education  2017 [employer]  Pelvic Rest Introduction Pelvic rest may be recommended if: Your placenta is partially or completely covering the opening of your cervix (placenta previa). There is bleeding between the wall of the uterus and the amniotic sac in the first trimester of pregnancy (subchorionic hemorrhage). You went into labor too early (preterm labor). Based on your overall health and the health of your baby, your health care provider will decide if pelvic rest is right for you. How do I rest my pelvis? For as long as told by your health care provider: Do not have sex, sexual stimulation, or an orgasm. Do not use tampons. Do not douche. Do not put anything in your vagina. Do not lift anything that is heavier than 10 lb (4.5 kg). Avoid activities that take a lot of effort (are strenuous). Avoid any activity in which your pelvic muscles could become strained. When should I seek medical care? Seek medical care if you have: Cramping pain in your lower abdomen. Vaginal discharge. A low, dull backache. Regular contractions. Uterine tightening. When should I seek immediate medical care? Seek immediate medical care if: You have vaginal bleeding and you are pregnant. This information is not intended to replace advice given to you by your health care provider. Make sure you discuss any questions you have with your health care provider. Document Released: 07/20/2010 Document Revised: 08/31/2015  Document Reviewed: 09/26/2014  2017 Elsevier

## 2016-04-09 ENCOUNTER — Encounter (HOSPITAL_COMMUNITY): Payer: Self-pay

## 2016-04-09 ENCOUNTER — Inpatient Hospital Stay (HOSPITAL_COMMUNITY)
Admission: AD | Admit: 2016-04-09 | Discharge: 2016-04-10 | Disposition: A | Discharge status: Home / Self Care | Payer: No Typology Code available for payment source | Source: Ambulatory Visit | Attending: Obstetrics and Gynecology | Admitting: Obstetrics and Gynecology

## 2016-04-09 DIAGNOSIS — O26891 Other specified pregnancy related conditions, first trimester: Secondary | ICD-10-CM | POA: Insufficient documentation

## 2016-04-09 DIAGNOSIS — Z3A12 12 weeks gestation of pregnancy: Secondary | ICD-10-CM | POA: Diagnosis not present

## 2016-04-09 DIAGNOSIS — Z79899 Other long term (current) drug therapy: Secondary | ICD-10-CM | POA: Diagnosis not present

## 2016-04-09 DIAGNOSIS — F419 Anxiety disorder, unspecified: Secondary | ICD-10-CM | POA: Diagnosis not present

## 2016-04-09 DIAGNOSIS — O99281 Endocrine, nutritional and metabolic diseases complicating pregnancy, first trimester: Secondary | ICD-10-CM | POA: Diagnosis not present

## 2016-04-09 DIAGNOSIS — O99341 Other mental disorders complicating pregnancy, first trimester: Secondary | ICD-10-CM | POA: Diagnosis not present

## 2016-04-09 DIAGNOSIS — K529 Noninfective gastroenteritis and colitis, unspecified: Secondary | ICD-10-CM

## 2016-04-09 DIAGNOSIS — O468X1 Other antepartum hemorrhage, first trimester: Secondary | ICD-10-CM

## 2016-04-09 DIAGNOSIS — O418X10 Other specified disorders of amniotic fluid and membranes, first trimester, not applicable or unspecified: Secondary | ICD-10-CM

## 2016-04-09 DIAGNOSIS — E860 Dehydration: Secondary | ICD-10-CM | POA: Insufficient documentation

## 2016-04-09 DIAGNOSIS — F329 Major depressive disorder, single episode, unspecified: Secondary | ICD-10-CM | POA: Insufficient documentation

## 2016-04-09 DIAGNOSIS — R109 Unspecified abdominal pain: Secondary | ICD-10-CM | POA: Diagnosis present

## 2016-04-09 LAB — URINALYSIS, ROUTINE W REFLEX MICROSCOPIC
BILIRUBIN URINE: NEGATIVE
Bacteria, UA: NONE SEEN
Glucose, UA: NEGATIVE mg/dL
Ketones, ur: 20 mg/dL — AB
Leukocytes, UA: NEGATIVE
NITRITE: NEGATIVE
PH: 5 (ref 5.0–8.0)
Protein, ur: 30 mg/dL — AB
SPECIFIC GRAVITY, URINE: 1.019 (ref 1.005–1.030)

## 2016-04-09 MED ORDER — PROMETHAZINE HCL 25 MG/ML IJ SOLN
25.0000 mg | Freq: Once | INTRAMUSCULAR | Status: AC
  Administered 2016-04-10: 25 mg via INTRAVENOUS
  Filled 2016-04-09: qty 1

## 2016-04-09 MED ORDER — LACTATED RINGERS IV BOLUS (SEPSIS)
1000.0000 mL | Freq: Once | INTRAVENOUS | Status: AC
  Administered 2016-04-10: 1000 mL via INTRAVENOUS

## 2016-04-09 NOTE — MAU Note (Signed)
Pt presents complaining of nausea and vomiting x2 days and is unable to keep anything down. No diarrhea. Also having lower abdominal pain. Have not taken any pain medication for it. Some brown discharge.

## 2016-04-09 NOTE — MAU Provider Note (Signed)
History     CSN: [id number]  Arrival date and time: 04/09/16 2256   None     Chief Complaint  Patient presents with  . Abdominal Pain  . Nausea   G1 @12  weeks here with N/V x2 days. She in unable to tolerate po. She denies fevers. No sick contacts. No diarrhea. She denies VB or cramping. She is worried about the well being of her baby.    OB History    Gravida Para Term Preterm AB Living   1             SAB TAB Ectopic Multiple Live Births                  Past Medical History:  Diagnosis Date  . Anxiety   . Depression     Past Surgical History:  Procedure Laterality Date  . TOOTH EXTRACTION      History reviewed. No pertinent family history.  Social History  Substance Use Topics  . Smoking status: Never Smoker  . Smokeless tobacco: Never Used  . Alcohol use No    Allergies: No Known Allergies  Prescriptions Prior to Admission  Medication Sig Dispense Refill Last Dose  . acyclovir (ZOVIRAX) 400 MG tablet Take 1 tablet (400 mg total) by mouth 2 (two) times daily. 60 tablet 6   . clonazePAM (KLONOPIN) 0.5 MG tablet Take 1 tablet (0.5 mg total) by mouth once. 4 tablet 0   . dicyclomine (BENTYL) 20 MG tablet Take 1 tablet (20 mg total) by mouth 2 (two) times daily. 10 tablet 0   . FLUoxetine (PROZAC) 40 MG capsule Take 1 capsule (40 mg total) by mouth 2 (two) times daily. 60 capsule 5   . ondansetron (ZOFRAN) 4 MG tablet Take 1 tablet (4 mg total) by mouth every 8 (eight) hours as needed for nausea or vomiting. 20 tablet 0     Review of Systems  Constitutional: Negative.   Gastrointestinal: Positive for nausea and vomiting.   Physical Exam   Temp:  [98.1 F (36.7 C)] 98.1 F (36.7 C) (01/02 2316) Pulse Rate:  [98-181] 98 (01/03 0102) Resp:  [18] 18 (01/02 2316) BP: (134-151)/(86-101) 134/86 (01/03 0102) Weight:  [109.5 kg (241 lb 6.4 oz)] 109.5 kg (241 lb 6.4 oz) (01/02 2316)  Physical Exam  Nursing note and vitals reviewed. Constitutional: She is  oriented to person, place, and time. She appears well-developed and well-nourished. No distress.  HENT:  Head: Normocephalic and atraumatic.  Neck: Normal range of motion.  Cardiovascular: Normal rate.   Respiratory: Effort normal.  GI: She exhibits no distension and no mass. There is no tenderness. There is no rebound and no guarding.  Musculoskeletal: Normal range of motion.  Neurological: She is alert and oriented to person, place, and time.  Skin: Skin is warm and dry.  Psychiatric: Her mood appears anxious.   Bedside US performed d/t inability to doppler FHT: active fetus w/ FHR 160 MAU Course  Procedures LR 1L bolus Phenergan 25 mg IV  MDM Labs ordered and reviewed. No episodes of emesis. Tolerating po after meds. Presentation, clinical findings, and plan discussed with Dr. Rana SnareLowe. Stable for discharge home.  Assessment and Plan  [redacted] weeks gestation Gastroenteritis Dehydration  Discharge home Follow up in office as scheduled Maintain hydration BRAT diet  Allergies as of 04/10/2016   No Known Allergies     Medication List    STOP taking these medications   acyclovir 400 MG tablet Commonly known  as:  ZOVIRAX   clonazePAM 0.5 MG tablet Commonly known as:  KLONOPIN   dicyclomine 20 MG tablet Commonly known as:  BENTYL   ondansetron 4 MG tablet Commonly known as:  ZOFRAN     TAKE these medications   FLUoxetine 40 MG capsule Commonly known as:  PROZAC Take 1 capsule (40 mg total) by mouth 2 (two) times daily.   promethazine 25 MG tablet Commonly known as:  PHENERGAN Take 1 tablet (25 mg total) by mouth every 6 (six) hours as needed for nausea or vomiting.      Donette Larry, CNM 04/09/2016, 11:39 PM

## 2016-04-10 DIAGNOSIS — K529 Noninfective gastroenteritis and colitis, unspecified: Secondary | ICD-10-CM

## 2016-04-10 DIAGNOSIS — Z3A12 12 weeks gestation of pregnancy: Secondary | ICD-10-CM

## 2016-04-10 DIAGNOSIS — E860 Dehydration: Secondary | ICD-10-CM

## 2016-04-10 MED ORDER — PROMETHAZINE HCL 25 MG PO TABS: 25.0000 mg | ORAL_TABLET | Freq: Four times a day (QID) | ORAL | 0 refills | Status: DC | PRN

## 2016-04-10 NOTE — Discharge Instructions (Signed)
Bland Diet Introduction A bland diet consists of foods that do not have a lot of fat or fiber. Foods without fat or fiber are easier for the body to digest. They are also less likely to irritate your mouth, throat, stomach, and other parts of your gastrointestinal tract. A bland diet is sometimes called a BRAT diet. What is my plan? Your health care provider or dietitian may recommend specific changes to your diet to prevent and treat your symptoms, such as:  Eating small meals often.  Cooking food until it is soft enough to chew easily.  Chewing your food well.  Drinking fluids slowly.  Not eating foods that are very spicy, sour, or fatty.  Not eating citrus fruits, such as oranges and grapefruit. What do I need to know about this diet?  Eat a variety of foods from the bland diet food list.  Do not follow a bland diet longer than you have to.  Ask your health care provider whether you should take vitamins. What foods can I eat? Grains  Hot cereals, such as cream of wheat. Bread, crackers, or tortillas made from refined white flour. Rice. Vegetables  Canned or cooked vegetables. Mashed or boiled potatoes. Fruits  Bananas. Applesauce. Other types of cooked or canned fruit with the skin and seeds removed, such as canned peaches or pears. Meats and Other Protein Sources  Scrambled eggs. Creamy peanut butter or other nut butters. Lean, well-cooked meats, such as chicken or fish. Tofu. Soups or broths. Dairy  Low-fat dairy products, such as milk, cottage cheese, or yogurt. Beverages  Water. Herbal tea. Apple juice. Sweets and Desserts  Pudding. Custard. Fruit gelatin. Ice cream. Fats and Oils  Mild salad dressings. Canola or olive oil. The items listed above may not be a complete list of allowed foods or beverages. Contact your dietitian for more options.  What foods are not recommended? Foods and ingredients that are often not recommended include:  Spicy foods, such as hot  sauce or salsa.  Fried foods.  Sour foods, such as pickled or fermented foods.  Raw vegetables or fruits, especially citrus or berries.  Caffeinated drinks.  Alcohol.  Strongly flavored seasonings or condiments. The items listed above may not be a complete list of foods and beverages that are not allowed. Contact your dietitian for more information.  This information is not intended to replace advice given to you by your health care provider. Make sure you discuss any questions you have with your health care provider. Document Released: 07/17/2015 Document Revised: 08/31/2015 Document Reviewed: 04/06/2014  2017 Elsevier Viral Gastroenteritis, Adult Introduction Viral gastroenteritis is also known as the stomach flu. This condition is caused by certain germs (viruses). These germs can be passed from person to person very easily (are very contagious). This condition can cause sudden watery poop (diarrhea), fever, and throwing up (vomiting). Having watery poop and throwing up can make you feel weak and cause you to get dehydrated. Dehydration can make you tired and thirsty, make you have a dry mouth, and make it so you pee (urinate) less often. Older adults and people with other diseases or a weak defense system (immune system) are at higher risk for dehydration. It is important to replace the fluids that you lose from having watery poop and throwing up. Follow these instructions at home: Follow instructions from your doctor about how to care for yourself at home. Eating and drinking Follow these instructions as told by your doctor:  Take an oral rehydration solution (  ORS). This is a drink that is sold at pharmacies and stores.  Drink clear fluids in small amounts as you are able, such as:  Water.  Ice chips.  Diluted fruit juice.  Low-calorie sports drinks.  Eat bland, easy-to-digest foods in small amounts as you are able, such as:  Bananas.  Applesauce.  Rice.  Low-fat  (lean) meats.  Toast.  Crackers.  Avoid fluids that have a lot of sugar or caffeine in them.  Avoid alcohol.  Avoid spicy or fatty foods. General instructions  Drink enough fluid to keep your pee (urine) clear or pale yellow.  Wash your hands often. If you cannot use soap and water, use hand sanitizer.  Make sure that all people in your home wash their hands well and often.  Rest at home while you get better.  Take over-the-counter and prescription medicines only as told by your doctor.  Watch your condition for any changes.  Take a warm bath to help with any burning or pain from having watery poop.  Keep all follow-up visits as told by your doctor. This is important. Contact a doctor if:  You cannot keep fluids down.  Your symptoms get worse.  You have new symptoms.  You feel light-headed or dizzy.  You have muscle cramps. Get help right away if:  You have chest pain.  You feel very weak or you pass out (faint).  You see blood in your throw-up.  Your throw-up looks like coffee grounds.  You have bloody or black poop (stools) or poop that look like tar.  You have a very bad headache, a stiff neck, or both.  You have a rash.  You have very bad pain, cramping, or bloating in your belly (abdomen).  You have trouble breathing.  You are breathing very quickly.  Your heart is beating very quickly.  Your skin feels cold and clammy.  You feel confused.  You have pain when you pee.  You have signs of dehydration, such as:  Dark pee, hardly any pee, or no pee.  Cracked lips.  Dry mouth.  Sunken eyes.  Sleepiness.  Weakness. This information is not intended to replace advice given to you by your health care provider. Make sure you discuss any questions you have with your health care provider. Document Released: 09/11/2007 Document Revised: 10/13/2015 Document Reviewed: 11/29/2014  2017 Elsevier

## 2016-04-11 ENCOUNTER — Encounter (HOSPITAL_COMMUNITY): Payer: Self-pay | Admitting: *Deleted

## 2016-04-11 ENCOUNTER — Inpatient Hospital Stay (HOSPITAL_COMMUNITY)
Admission: AD | Admit: 2016-04-11 | Discharge: 2016-04-11 | Disposition: A | Discharge status: Home / Self Care | Payer: No Typology Code available for payment source | Source: Ambulatory Visit | Attending: Obstetrics & Gynecology | Admitting: Obstetrics & Gynecology

## 2016-04-11 DIAGNOSIS — O99611 Diseases of the digestive system complicating pregnancy, first trimester: Secondary | ICD-10-CM | POA: Insufficient documentation

## 2016-04-11 DIAGNOSIS — O99320 Drug use complicating pregnancy, unspecified trimester: Secondary | ICD-10-CM

## 2016-04-11 DIAGNOSIS — K5900 Constipation, unspecified: Secondary | ICD-10-CM | POA: Diagnosis not present

## 2016-04-11 DIAGNOSIS — O99341 Other mental disorders complicating pregnancy, first trimester: Secondary | ICD-10-CM | POA: Diagnosis not present

## 2016-04-11 DIAGNOSIS — F419 Anxiety disorder, unspecified: Secondary | ICD-10-CM | POA: Diagnosis not present

## 2016-04-11 DIAGNOSIS — Z3A12 12 weeks gestation of pregnancy: Secondary | ICD-10-CM | POA: Diagnosis not present

## 2016-04-11 DIAGNOSIS — R112 Nausea with vomiting, unspecified: Secondary | ICD-10-CM

## 2016-04-11 DIAGNOSIS — F1290 Cannabis use, unspecified, uncomplicated: Secondary | ICD-10-CM | POA: Diagnosis present

## 2016-04-11 DIAGNOSIS — F329 Major depressive disorder, single episode, unspecified: Secondary | ICD-10-CM | POA: Diagnosis not present

## 2016-04-11 DIAGNOSIS — O26891 Other specified pregnancy related conditions, first trimester: Secondary | ICD-10-CM | POA: Insufficient documentation

## 2016-04-11 DIAGNOSIS — Z79899 Other long term (current) drug therapy: Secondary | ICD-10-CM | POA: Insufficient documentation

## 2016-04-11 DIAGNOSIS — F1120 Opioid dependence, uncomplicated: Secondary | ICD-10-CM

## 2016-04-11 DIAGNOSIS — O219 Vomiting of pregnancy, unspecified: Secondary | ICD-10-CM | POA: Insufficient documentation

## 2016-04-11 LAB — RAPID URINE DRUG SCREEN, HOSP PERFORMED
Amphetamines: NOT DETECTED
Barbiturates: NOT DETECTED
Benzodiazepines: NOT DETECTED
Cocaine: NOT DETECTED
OPIATES: NOT DETECTED
Tetrahydrocannabinol: POSITIVE — AB

## 2016-04-11 LAB — URINALYSIS, ROUTINE W REFLEX MICROSCOPIC
BILIRUBIN URINE: NEGATIVE
Glucose, UA: NEGATIVE mg/dL
KETONES UR: NEGATIVE mg/dL
Leukocytes, UA: NEGATIVE
NITRITE: NEGATIVE
Protein, ur: NEGATIVE mg/dL
SPECIFIC GRAVITY, URINE: 1.017 (ref 1.005–1.030)
pH: 5 (ref 5.0–8.0)

## 2016-04-11 MED ORDER — SCOPOLAMINE 1 MG/3DAYS TD PT72
1.0000 | MEDICATED_PATCH | Freq: Once | TRANSDERMAL | Status: DC
  Administered 2016-04-11: 1.5 mg via TRANSDERMAL
  Filled 2016-04-11: qty 1

## 2016-04-11 MED ORDER — DEXTROSE IN LACTATED RINGERS 5 % IV SOLN
INTRAVENOUS | Status: DC
  Administered 2016-04-11: 11:00:00 via INTRAVENOUS

## 2016-04-11 MED ORDER — SODIUM CHLORIDE 0.9 % IV SOLN
25.0000 mg | Freq: Once | INTRAVENOUS | Status: AC
  Administered 2016-04-11: 25 mg via INTRAVENOUS
  Filled 2016-04-11: qty 1

## 2016-04-11 MED ORDER — SCOPOLAMINE 1 MG/3DAYS TD PT72: 1.0000 | MEDICATED_PATCH | TRANSDERMAL | Status: DC

## 2016-04-11 MED ORDER — FLEET ENEMA 7-19 GM/118ML RE ENEM
1.0000 | ENEMA | Freq: Once | RECTAL | Status: AC
  Administered 2016-04-11: 1 via RECTAL

## 2016-04-11 MED ORDER — SCOPOLAMINE 1 MG/3DAYS TD PT72: 1.0000 | MEDICATED_PATCH | Freq: Once | TRANSDERMAL | 0 refills | Status: AC

## 2016-04-11 NOTE — Discharge Instructions (Signed)
Hyperemesis Gravidarum Hyperemesis gravidarum is a severe form of nausea and vomiting that happens during pregnancy. Hyperemesis is worse than morning sickness. It may cause you to have nausea or vomiting all day for many days. It may keep you from eating and drinking enough food and liquids. Hyperemesis usually occurs during the first half (the first 20 weeks) of pregnancy. It often goes away once a woman is in her second half of pregnancy. However, sometimes hyperemesis continues through an entire pregnancy. What are the causes? The cause of this condition is not known. It may be related to changes in chemicals (hormones) in the body during pregnancy, such as the high level of pregnancy hormone (human chorionic gonadotropin) or the increase in the female sex hormone (estrogen). What are the signs or symptoms? Symptoms of this condition include:  Severe nausea and vomiting.  Nausea that does not go away.  Vomiting that does not allow you to keep any food down.  Weight loss.  Body fluid loss (dehydration).  Having no desire to eat, or not liking food that you have previously enjoyed. How is this diagnosed? This condition may be diagnosed based on:  A physical exam.  Your medical history.  Your symptoms.  Blood tests.  Urine tests. How is this treated? This condition may be managed with medicine. If medicines to do not help relieve nausea and vomiting, you may need to receive fluids through an IV tube at the hospital. Follow these instructions at home:  Take over-the-counter and prescription medicines only as told by your health care provider.  Avoid iron pills and multivitamins that contain iron for the first 3-4 months of pregnancy. If you take prescription iron pills, do not stop taking them unless your health care provider approves.  Take the following actions to help prevent nausea and vomiting:  In the morning, before getting out of bed, try eating a couple of dry  crackers or a piece of toast.  Avoid foods and smells that upset your stomach. Fatty and spicy foods may make nausea worse.  Eat 5-6 small meals a day.  Do not drink fluids while eating meals. Drink between meals.  Eat or suck on things that have ginger in them. Ginger can help relieve nausea.  Avoid food preparation. The smell of food can spoil your appetite or trigger nausea.  Follow instructions from your health care provider about eating or drinking restrictions.  For snacks, eat high-protein foods, such as cheese.  Keep all follow-up and pre-birth (prenatal) visits as told by your health care provider. This is important. Contact a health care provider if:  You have pain in your abdomen.  You have a severe headache.  You have vision problems.  You are losing weight. Get help right away if:  You cannot drink fluids without vomiting.  You vomit blood.  You have constant nausea and vomiting.  You are very weak.  You are very thirsty.  You feel dizzy.  You faint.  You have a fever or other symptoms that last for more than 2-3 days.  You have a fever and your symptoms suddenly get worse. Summary  Hyperemesis gravidarum is a severe form of nausea and vomiting that happens during pregnancy.  Making some changes to your eating habits may help relieve nausea and vomiting.  This condition may be managed with medicine.  If medicines to do not help relieve nausea and vomiting, you may need to receive fluids through an IV tube at the hospital. This information is  not intended to replace advice given to you by your health care provider. Make sure you discuss any questions you have with your health care provider. Document Released: 03/25/2005 Document Revised: 11/22/2015 Document Reviewed: 11/22/2015 Elsevier Interactive Patient Education  2017 [employer] Prenatal Care Providers [facility]    [facility]  [phone]  [phone]     Phone: [phone]          [facility]                      [facility]  @[place]     Phone: [phone]  Phone: [phone]         [facility]     Phone: [phone]  Phone: [phone]           [facility] [place]                hone: [phone]  Phone: [phone]         [facility] [address] Francoise Ceo[facility]ard Marshall      Phone: [phone]  Phone: [phone]         [facility]                Phone: [phone]  [facility]   [address][phone]    [facility]([place])          Phone: [phone] [facility]   Phone: [phone]

## 2016-04-11 NOTE — MAU Provider Note (Signed)
Chief Complaint: Nausea; Emesis During Pregnancy; and Abdominal Pain   First Provider Initiated Contact with Patient 04/11/16 0855        SUBJECTIVE HPI: Abigail Fox is a 31 y.o. G1P0 at 3662w6d by LMP who presents to maternity admissions reporting nausea and vomiting which is not better with Phenergan. States has tried everything and cannot keep food or fluids down.  Also c/o cramping in LLQ.  Does have significant constipation.   She denies vaginal bleeding, vaginal itching/burning, urinary symptoms, h/a, dizziness, or fever/chills.    Went to Dr Langston MaskerMorris' office once a while ago, but cancelled this week due to loss of insurance. Does not plan to go back.  Has not tried to apply for Medicaid yet.   On suboxone, states is stable on that.  Sees Dr Tollie EthPlummer.  Emesis   This is a recurrent problem. The current episode started in the past 7 days. The problem occurs 5 to 10 times per day. The problem has been unchanged. There has been no fever. Pertinent negatives include no abdominal pain, chest pain, chills, coughing, diarrhea, fever or myalgias. Treatments tried: phenergan  The treatment provided no relief.   RN Note: C/o N&V for past 4-5 days; c/o cramping & pressure  for past 4-5 days; having some scant brown spotting;  Past Medical History:  Diagnosis Date  . Anxiety   . Depression    Past Surgical History:  Procedure Laterality Date  . TOOTH EXTRACTION     Social History   Social History  . Marital status: Married    Spouse name: N/A  . Number of children: N/A  . Years of education: N/A   Occupational History  . Not on file.   Social History Main Topics  . Smoking status: Never Smoker  . Smokeless tobacco: Never Used  . Alcohol use No  . Drug use:     Types: Marijuana     Comment: last use 02/24/2016  . Sexual activity: Yes   Other Topics Concern  . Not on file   Social History Narrative  . No narrative on file   No current facility-administered medications on  file prior to encounter.    Current Outpatient Prescriptions on File Prior to Encounter  Medication Sig Dispense Refill  . FLUoxetine (PROZAC) 40 MG capsule Take 1 capsule (40 mg total) by mouth 2 (two) times daily. 60 capsule 5  . promethazine (PHENERGAN) 25 MG tablet Take 1 tablet (25 mg total) by mouth every 6 (six) hours as needed for nausea or vomiting. 30 tablet 0   No Known Allergies  I have reviewed patient's Past Medical Hx, Surgical Hx, Family Hx, Social Hx, medications and allergies.   ROS:  Review of Systems  Constitutional: Negative for chills and fever.  Respiratory: Negative for cough.   Cardiovascular: Negative for chest pain.  Gastrointestinal: Positive for vomiting. Negative for abdominal pain and diarrhea.  Genitourinary: Positive for pelvic pain (Left lower quadrant cramping).  Musculoskeletal: Negative for myalgias.   Review of Systems  Other systems negative   Physical Exam  Physical Exam Patient Vitals for the past 24 hrs:  BP Temp Temp src Pulse Resp  04/11/16 0850 119/65 97.6 F (36.4 C) Oral 68 20   Constitutional: Well-developed, well-nourished female in no acute distress.  Cardiovascular: normal rate Respiratory: normal effort GI: Abd soft, non-tender. Pos BS x 4 MS: Extremities nontender, no edema, normal ROM Neurologic: Alert and oriented x 4.  GU: Neg CVAT. FHT 160 by bedside [place]  LAB RESULTS Results for orders placed or performed during the hospital encounter of 04/11/16 (from the past 72 hour(s))  Urinalysis, Routine w reflex microscopic     Status: Abnormal   Collection Time: 04/11/16 10:50 AM  Result Value Ref Range   Color, Urine AMBER (A) YELLOW    Comment: BIOCHEMICALS MAY BE AFFECTED BY COLOR   APPearance HAZY (A) CLEAR   Specific Gravity, Urine 1.017 1.005 - 1.030   pH 5.0 5.0 - 8.0   Glucose, UA NEGATIVE NEGATIVE mg/dL   Hgb urine dipstick SMALL (A) NEGATIVE   Bilirubin Urine NEGATIVE NEGATIVE   Ketones, ur NEGATIVE NEGATIVE  mg/dL   Protein, ur NEGATIVE NEGATIVE mg/dL   Nitrite NEGATIVE NEGATIVE   Leukocytes, UA NEGATIVE NEGATIVE   RBC / HPF 0-5 0 - 5 RBC/hpf   WBC, UA 0-5 0 - 5 WBC/hpf   Bacteria, UA RARE (A) NONE SEEN   Squamous Epithelial / LPF 6-30 (A) NONE SEEN   Mucous PRESENT     --/--/O POS (11/20 1316)  IMAGING No results found.  MAU Management/MDM: IV started Two liters of IV fluid, one with Phenergan in it Did well but then vomited once Urine shows no evidence of dehydration Discussed use of Zofran and possible birth defects, patient does not want to use it Will try scopalamine patch. Gave Fleet enema with results. Pain in LLQ lessened after that.   ASSESSMENT SIUP at [redacted]w[redacted]d Nausea and vomiting Constipation  PLAN Discharge home Rx Scopalamine patch for nausea May use Phenergan PRN If these don't work, consider zofran, though I anticipate this to improve with gestational age   Pt stable at time of discharge. Encouraged to return here or to other Urgent Care/ED if she develops worsening of symptoms, increase in pain, fever, or other concerning symptoms.    Wynelle Bourgeois CNM, MSN Certified Nurse-Midwife 04/11/2016  8:55 AM   After she left, UDS returned showing only THC If vomiting persists, may need to ask about THC use, with cyclic vomiting syndrome being a possibility.  Aviva Signs, CNM

## 2016-04-11 NOTE — MAU Note (Signed)
C/o N&V for past 4-5 days; c/o cramping & pressure  for past 4-5 days; having some scant brown spotting;

## 2016-05-14 ENCOUNTER — Encounter (HOSPITAL_COMMUNITY)
Admission: AD | Disposition: A | Discharge status: Home / Self Care | Payer: Self-pay | Source: Ambulatory Visit | Attending: Family Medicine

## 2016-05-14 ENCOUNTER — Ambulatory Visit (HOSPITAL_COMMUNITY)
Admission: AD | Admit: 2016-05-14 | Discharge: 2016-05-14 | Disposition: A | Discharge status: Home / Self Care | Payer: Medicaid Other | Source: Ambulatory Visit | Attending: Family Medicine | Admitting: Family Medicine

## 2016-05-14 ENCOUNTER — Inpatient Hospital Stay (HOSPITAL_COMMUNITY): Payer: Medicaid Other | Admitting: Anesthesiology

## 2016-05-14 ENCOUNTER — Inpatient Hospital Stay (HOSPITAL_COMMUNITY): Payer: Medicaid Other

## 2016-05-14 ENCOUNTER — Encounter (HOSPITAL_COMMUNITY): Payer: Self-pay | Admitting: *Deleted

## 2016-05-14 DIAGNOSIS — O021 Missed abortion: Secondary | ICD-10-CM | POA: Diagnosis not present

## 2016-05-14 DIAGNOSIS — Z3A17 17 weeks gestation of pregnancy: Secondary | ICD-10-CM

## 2016-05-14 DIAGNOSIS — O99212 Obesity complicating pregnancy, second trimester: Secondary | ICD-10-CM | POA: Diagnosis not present

## 2016-05-14 DIAGNOSIS — F419 Anxiety disorder, unspecified: Secondary | ICD-10-CM | POA: Insufficient documentation

## 2016-05-14 DIAGNOSIS — O99324 Drug use complicating childbirth: Secondary | ICD-10-CM | POA: Diagnosis not present

## 2016-05-14 DIAGNOSIS — F1290 Cannabis use, unspecified, uncomplicated: Secondary | ICD-10-CM | POA: Insufficient documentation

## 2016-05-14 DIAGNOSIS — F329 Major depressive disorder, single episode, unspecified: Secondary | ICD-10-CM | POA: Diagnosis not present

## 2016-05-14 DIAGNOSIS — IMO0002 Reserved for concepts with insufficient information to code with codable children: Secondary | ICD-10-CM

## 2016-05-14 DIAGNOSIS — O364XX0 Maternal care for intrauterine death, not applicable or unspecified: Secondary | ICD-10-CM | POA: Diagnosis present

## 2016-05-14 DIAGNOSIS — Z6841 Body Mass Index (BMI) 40.0 and over, adult: Secondary | ICD-10-CM | POA: Diagnosis not present

## 2016-05-14 HISTORY — PX: DILATION AND EVACUATION: SHX1459

## 2016-05-14 LAB — TSH: TSH: 0.816 u[IU]/mL (ref 0.350–4.500)

## 2016-05-14 LAB — CBC
HCT: 27.5 % — ABNORMAL LOW (ref 36.0–46.0)
Hemoglobin: 9.7 g/dL — ABNORMAL LOW (ref 12.0–15.0)
MCH: 30.9 pg (ref 26.0–34.0)
MCHC: 35.3 g/dL (ref 30.0–36.0)
MCV: 87.6 fL (ref 78.0–100.0)
PLATELETS: 209 10*3/uL (ref 150–400)
RBC: 3.14 MIL/uL — ABNORMAL LOW (ref 3.87–5.11)
RDW: 13.8 % (ref 11.5–15.5)
WBC: 17.7 10*3/uL — AB (ref 4.0–10.5)

## 2016-05-14 LAB — URINALYSIS, ROUTINE W REFLEX MICROSCOPIC
BACTERIA UA: NONE SEEN
Bilirubin Urine: NEGATIVE
Glucose, UA: NEGATIVE mg/dL
Ketones, ur: NEGATIVE mg/dL
LEUKOCYTES UA: NEGATIVE
NITRITE: NEGATIVE
PROTEIN: NEGATIVE mg/dL
Specific Gravity, Urine: 1.017 (ref 1.005–1.030)
pH: 5 (ref 5.0–8.0)

## 2016-05-14 LAB — TYPE AND SCREEN
ABO/RH(D): O POS
ANTIBODY SCREEN: NEGATIVE

## 2016-05-14 LAB — ANTITHROMBIN III: AntiThromb III Func: 111 % (ref 75–120)

## 2016-05-14 SURGERY — DILATION AND EVACUATION, UTERUS, SECOND TRIMESTER
Anesthesia: General | Site: Uterus

## 2016-05-14 MED ORDER — PROPOFOL 10 MG/ML IV BOLUS
INTRAVENOUS | Status: DC | PRN
  Administered 2016-05-14: 200 mg via INTRAVENOUS

## 2016-05-14 MED ORDER — FENTANYL CITRATE (PF) 100 MCG/2ML IJ SOLN
INTRAMUSCULAR | Status: AC
  Filled 2016-05-14: qty 2

## 2016-05-14 MED ORDER — HYDROCODONE-ACETAMINOPHEN 7.5-325 MG PO TABS
ORAL_TABLET | ORAL | Status: AC
  Filled 2016-05-14: qty 1

## 2016-05-14 MED ORDER — MIDAZOLAM HCL 5 MG/5ML IJ SOLN
INTRAMUSCULAR | Status: DC | PRN
  Administered 2016-05-14: 2 mg via INTRAVENOUS

## 2016-05-14 MED ORDER — SUCCINYLCHOLINE CHLORIDE 200 MG/10ML IV SOSY
PREFILLED_SYRINGE | INTRAVENOUS | Status: AC
  Filled 2016-05-14: qty 10

## 2016-05-14 MED ORDER — ONDANSETRON 8 MG PO TBDP
8.0000 mg | ORAL_TABLET | Freq: Once | ORAL | Status: DC
  Filled 2016-05-14: qty 1

## 2016-05-14 MED ORDER — MIDAZOLAM HCL 2 MG/2ML IJ SOLN
INTRAMUSCULAR | Status: AC
  Filled 2016-05-14: qty 2

## 2016-05-14 MED ORDER — BUPIVACAINE HCL 0.5 % IJ SOLN
INTRAMUSCULAR | Status: DC | PRN
  Administered 2016-05-14: 30 mL

## 2016-05-14 MED ORDER — KETOROLAC TROMETHAMINE 30 MG/ML IJ SOLN
INTRAMUSCULAR | Status: DC | PRN
  Administered 2016-05-14: 30 mg via INTRAVENOUS

## 2016-05-14 MED ORDER — MEPERIDINE HCL 25 MG/ML IJ SOLN: 6.2500 mg | INTRAMUSCULAR | Status: DC | PRN

## 2016-05-14 MED ORDER — LACTATED RINGERS IV SOLN
INTRAVENOUS | Status: DC
Start: 2016-05-14 — End: 2016-05-15
  Administered 2016-05-14 (×2): via INTRAVENOUS

## 2016-05-14 MED ORDER — ONDANSETRON HCL 4 MG/2ML IJ SOLN
INTRAMUSCULAR | Status: AC
  Filled 2016-05-14: qty 2

## 2016-05-14 MED ORDER — METHYLERGONOVINE MALEATE 0.2 MG/ML IJ SOLN
INTRAMUSCULAR | Status: DC | PRN
  Administered 2016-05-14: 0.2 mg via INTRAMUSCULAR

## 2016-05-14 MED ORDER — LIDOCAINE HCL (CARDIAC) 20 MG/ML IV SOLN
INTRAVENOUS | Status: AC
  Filled 2016-05-14: qty 5

## 2016-05-14 MED ORDER — DEXAMETHASONE SODIUM PHOSPHATE 10 MG/ML IJ SOLN
INTRAMUSCULAR | Status: DC | PRN
  Administered 2016-05-14: 10 mg via INTRAVENOUS

## 2016-05-14 MED ORDER — DEXTROSE 5 % IV SOLN
100.0000 mg | Freq: Once | INTRAVENOUS | Status: AC
  Administered 2016-05-14: 100 mg via INTRAVENOUS
  Filled 2016-05-14: qty 100

## 2016-05-14 MED ORDER — SUCCINYLCHOLINE CHLORIDE 20 MG/ML IJ SOLN
INTRAMUSCULAR | Status: DC | PRN
  Administered 2016-05-14: 120 mg via INTRAVENOUS

## 2016-05-14 MED ORDER — HYDROMORPHONE HCL 1 MG/ML IJ SOLN
INTRAMUSCULAR | Status: AC
  Filled 2016-05-14: qty 1

## 2016-05-14 MED ORDER — HYDROMORPHONE HCL 1 MG/ML IJ SOLN
1.0000 mg | Freq: Once | INTRAMUSCULAR | Status: AC
  Administered 2016-05-14: 1 mg via INTRAVENOUS
  Filled 2016-05-14: qty 1

## 2016-05-14 MED ORDER — PROPOFOL 10 MG/ML IV BOLUS
INTRAVENOUS | Status: AC
  Filled 2016-05-14: qty 20

## 2016-05-14 MED ORDER — ONDANSETRON HCL 4 MG/2ML IJ SOLN
4.0000 mg | Freq: Once | INTRAMUSCULAR | Status: AC
Start: 2016-05-14 — End: 2016-05-14
  Administered 2016-05-14: 4 mg via INTRAVENOUS
  Filled 2016-05-14: qty 2

## 2016-05-14 MED ORDER — IBUPROFEN 600 MG PO TABS: 600.0000 mg | ORAL_TABLET | Freq: Four times a day (QID) | ORAL | 3 refills | Status: DC | PRN

## 2016-05-14 MED ORDER — PROMETHAZINE HCL 25 MG PO TABS: 25.0000 mg | ORAL_TABLET | Freq: Four times a day (QID) | ORAL | 2 refills | Status: DC | PRN

## 2016-05-14 MED ORDER — FAMOTIDINE IN NACL 20-0.9 MG/50ML-% IV SOLN
20.0000 mg | Freq: Once | INTRAVENOUS | Status: AC
  Administered 2016-05-14: 20 mg via INTRAVENOUS
  Filled 2016-05-14: qty 50

## 2016-05-14 MED ORDER — ONDANSETRON HCL 4 MG/2ML IJ SOLN
INTRAMUSCULAR | Status: DC | PRN
  Administered 2016-05-14: 4 mg via INTRAVENOUS

## 2016-05-14 MED ORDER — SCOPOLAMINE 1 MG/3DAYS TD PT72
MEDICATED_PATCH | TRANSDERMAL | Status: AC
  Filled 2016-05-14: qty 1

## 2016-05-14 MED ORDER — HYDROMORPHONE HCL 1 MG/ML IJ SOLN
INTRAMUSCULAR | Status: DC | PRN
  Administered 2016-05-14: 1 mg via INTRAVENOUS

## 2016-05-14 MED ORDER — HYDROMORPHONE HCL 1 MG/ML IJ SOLN
0.2500 mg | INTRAMUSCULAR | Status: DC | PRN
  Administered 2016-05-14 (×4): 0.5 mg via INTRAVENOUS

## 2016-05-14 MED ORDER — KETOROLAC TROMETHAMINE 30 MG/ML IJ SOLN
INTRAMUSCULAR | Status: AC
  Filled 2016-05-14: qty 1

## 2016-05-14 MED ORDER — OXYCODONE-ACETAMINOPHEN 5-325 MG PO TABS: 1.0000 | ORAL_TABLET | Freq: Four times a day (QID) | ORAL | 0 refills | Status: DC | PRN

## 2016-05-14 MED ORDER — LIDOCAINE HCL (CARDIAC) 20 MG/ML IV SOLN
INTRAVENOUS | Status: DC | PRN
  Administered 2016-05-14: 100 mg via INTRAVENOUS

## 2016-05-14 MED ORDER — HYDROCODONE-ACETAMINOPHEN 7.5-325 MG PO TABS
1.0000 | ORAL_TABLET | Freq: Once | ORAL | Status: AC | PRN
  Administered 2016-05-14: 1 via ORAL

## 2016-05-14 MED ORDER — BUPIVACAINE HCL (PF) 0.5 % IJ SOLN
INTRAMUSCULAR | Status: AC
  Filled 2016-05-14: qty 30

## 2016-05-14 MED ORDER — DOCUSATE SODIUM 100 MG PO CAPS: 100.0000 mg | ORAL_CAPSULE | Freq: Two times a day (BID) | ORAL | 2 refills | Status: DC | PRN

## 2016-05-14 MED ORDER — SOD CITRATE-CITRIC ACID 500-334 MG/5ML PO SOLN
30.0000 mL | Freq: Once | ORAL | Status: AC
  Administered 2016-05-14: 30 mL via ORAL
  Filled 2016-05-14: qty 15

## 2016-05-14 MED ORDER — FENTANYL CITRATE (PF) 100 MCG/2ML IJ SOLN
INTRAMUSCULAR | Status: DC | PRN
  Administered 2016-05-14: 100 ug via INTRAVENOUS
  Administered 2016-05-14 (×2): 50 ug via INTRAVENOUS

## 2016-05-14 MED ORDER — METOCLOPRAMIDE HCL 5 MG/ML IJ SOLN: 10.0000 mg | Freq: Once | INTRAMUSCULAR | Status: DC | PRN

## 2016-05-14 SURGICAL SUPPLY — 20 items
CATH ROBINSON RED A/P 16FR (CATHETERS) ×2 IMPLANT
CLOTH BEACON ORANGE TIMEOUT ST (SAFETY) ×2 IMPLANT
CONT PATH 16OZ SNAP LID 3702 (MISCELLANEOUS) IMPLANT
DECANTER SPIKE VIAL GLASS SM (MISCELLANEOUS) ×2 IMPLANT
GLOVE BIOGEL PI IND STRL 7.0 (GLOVE) ×3 IMPLANT
GLOVE BIOGEL PI INDICATOR 7.0 (GLOVE) ×3
GLOVE ECLIPSE 7.0 STRL STRAW (GLOVE) ×2 IMPLANT
GOWN STRL REUS W/TWL LRG LVL3 (GOWN DISPOSABLE) ×4 IMPLANT
KIT BERKELEY 1ST TRIMESTER 3/8 (MISCELLANEOUS) ×4 IMPLANT
KIT BERKELEY 2ND TRIMESTER 1/2 (COLLECTOR) ×2 IMPLANT
NS IRRIG 1000ML POUR BTL (IV SOLUTION) ×2 IMPLANT
PACK VAGINAL MINOR WOMEN LF (CUSTOM PROCEDURE TRAY) ×2 IMPLANT
PAD OB MATERNITY 4.3X12.25 (PERSONAL CARE ITEMS) ×2 IMPLANT
PAD PREP 24X48 CUFFED NSTRL (MISCELLANEOUS) ×2 IMPLANT
SET BERKELEY SUCTION TUBING (SUCTIONS) IMPLANT
TOWEL OR 17X24 6PK STRL BLUE (TOWEL DISPOSABLE) ×4 IMPLANT
TUBE VACURETTE 2ND TRIMESTER (CANNULA) ×2 IMPLANT
VACURETTE 12 RIGID CVD (CANNULA) IMPLANT
VACURETTE 14MM CVD 1/2 BASE (CANNULA) IMPLANT
VACURETTE 16MM ASPIR CVD .5 (CANNULA) ×2 IMPLANT

## 2016-05-14 NOTE — Anesthesia Procedure Notes (Signed)
Procedure Name: Intubation Date/Time: 05/14/2016 8:06 PM Performed by: Junious SilkGILBERT, [name] Pre-anesthesia Checklist: Patient identified, Emergency Drugs available, Suction available, Patient being monitored and Timeout performed Patient Re-evaluated:Patient Re-evaluated prior to inductionOxygen Delivery Method: Circle system utilized Preoxygenation: Pre-oxygenation with 100% oxygen Intubation Type: IV induction, Rapid sequence and Cricoid Pressure applied Laryngoscope Size: Droll and 2 Grade View: Grade I Tube type: Oral Tube size: 7.0 mm Number of attempts: 1 Airway Equipment and Method: Stylet Placement Confirmation: ETT inserted through vocal cords under direct vision,  positive ETCO2,  CO2 detector and breath sounds checked- equal and bilateral Secured at: 21 cm Tube secured with: Tape Dental Injury: Teeth and Oropharynx as per pre-operative assessment

## 2016-05-14 NOTE — Anesthesia Postprocedure Evaluation (Signed)
Anesthesia Post Note  Patient: Abigail AcostaChelsea Fox  Procedure(s) Performed: Procedure(s) (LRB): DILATATION AND EVACUATION (D&E) 2ND TRIMESTER WITH ULTRA SOUND GUIDANCE (N/A)  Patient location during evaluation: PACU Anesthesia Type: General Level of consciousness: awake and alert and oriented Pain management: pain level controlled Vital Signs Assessment: post-procedure vital signs reviewed and stable Respiratory status: spontaneous breathing, nonlabored ventilation and respiratory function stable Cardiovascular status: blood pressure returned to baseline and stable Postop Assessment: no signs of nausea or vomiting Anesthetic complications: no        Last Vitals:  Vitals:   05/14/16 2040 05/14/16 2045  BP: 139/85 (!) 147/81  Pulse: (!) 115 (!) 108  Resp: 15 15  Temp: 37.3 C     Last Pain:  Vitals:   05/14/16 2045  TempSrc:   PainSc: 8    Pain Goal: Patients Stated Pain Goal: 0 (05/14/16 2045)               Lasaro Primm A.

## 2016-05-14 NOTE — H&P (Signed)
Preoperative History and Physical  Abigail Fox is a 31 y.o. G1P0 here for surgical management of fetal demise at [redacted]w[redacted]d and elevated temperature concerning for infection.   No significant preoperative concerns.  Proposed surgery: Dilation and Evacuation under ultrasound guidance.  Past Medical History:  Diagnosis Date  . Anxiety   . Depression   . Headache    Past Surgical History:  Procedure Laterality Date  . TOOTH EXTRACTION     OB History  Gravida Para Term Preterm AB Living  1            SAB TAB Ectopic Multiple Live Births               # Outcome Date GA Lbr Len/2nd Weight Sex Delivery Anes PTL Lv  1 Current             Patient denies any other pertinent gynecologic issues.   No current facility-administered medications on file prior to encounter.    Current Outpatient Prescriptions on File Prior to Encounter  Medication Sig Dispense Refill  . buprenorphine (SUBUTEX) 8 MG SUBL SL tablet Place 4 mg under the tongue 2 (two) times daily.     . calcium carbonate (TUMS - DOSED IN MG ELEMENTAL CALCIUM) 500 MG chewable tablet Chew 1 tablet by mouth daily as needed for heartburn.     . Prenatal Vit-Fe Fumarate-FA (PRENATAL MULTIVITAMIN) TABS tablet Take 1 tablet by mouth daily at 12 noon.    . valACYclovir (VALTREX) 1000 MG tablet Take 1,000 mg by mouth daily.    . promethazine (PHENERGAN) 25 MG tablet Take 1 tablet (25 mg total) by mouth every 6 (six) hours as needed for nausea or vomiting. (Patient not taking: Reported on 04/11/2016) 30 tablet 0   No Known Allergies  Social History:   reports that she has never smoked. She has never used smokeless tobacco. She reports that she uses drugs, including Marijuana. She reports that she does not drink alcohol.  Family History  Problem Relation Age of Onset  . Diabetes Mother   . Hypertension Mother   . Heart disease Maternal Grandfather     Review of Systems: Noncontributory  PHYSICAL EXAM: Blood pressure 128/78, pulse 88,  temperature 99.2 F (37.3 C), temperature source Oral, resp. rate 20, height 5\' 4"  (1.626 m), weight 242 lb 8 oz (110 kg), last menstrual period 01/12/2016. CONSTITUTIONAL: Very anxious and distressed due to diagnosis NECK: Normal range of motion, supple, no masses SKIN: Skin is warm and dry. No rash noted. Not diaphoretic. No erythema. No pallor. NEUROLOGIC: Alert and oriented to person, place, and time. Normal reflexes, muscle tone coordination. No cranial nerve deficit noted. CARDIOVASCULAR: Normal heart rate noted, regular rhythm RESPIRATORY: Effort and breath sounds normal, no problems with respiration noted ABDOMEN: Soft, moderately tender to palpation, nondistended. PELVIC: No bleeding noted externally.  Internal exam deferred until surgery.  MUSCULOSKELETAL: Normal range of motion. No edema and no tenderness. 2+ distal pulses.  Labs: Results for orders placed or performed during the hospital encounter of 05/14/16 (from the past 336 hour(s))  Urinalysis, Routine w reflex microscopic   Collection Time: 05/14/16  3:35 PM  Result Value Ref Range   Color, Urine AMBER (A) YELLOW   APPearance CLEAR CLEAR   Specific Gravity, Urine 1.017 1.005 - 1.030   pH 5.0 5.0 - 8.0   Glucose, UA NEGATIVE NEGATIVE mg/dL   Hgb urine dipstick SMALL (A) NEGATIVE   Bilirubin Urine NEGATIVE NEGATIVE   Ketones, ur NEGATIVE NEGATIVE  mg/dL   Protein, ur NEGATIVE NEGATIVE mg/dL   Nitrite NEGATIVE NEGATIVE   Leukocytes, UA NEGATIVE NEGATIVE   RBC / HPF 0-5 0 - 5 RBC/hpf   WBC, UA 0-5 0 - 5 WBC/hpf   Bacteria, UA NONE SEEN NONE SEEN   Squamous Epithelial / LPF 0-5 (A) NONE SEEN   Mucous PRESENT   CBC   Collection Time: 05/14/16  7:01 PM  Result Value Ref Range   WBC 17.7 (H) 4.0 - 10.5 K/uL   RBC 3.14 (L) 3.87 - 5.11 MIL/uL   Hemoglobin 9.7 (L) 12.0 - 15.0 g/dL   HCT 96.027.5 (L) 45.436.0 - 09.846.0 %   MCV 87.6 78.0 - 100.0 fL   MCH 30.9 26.0 - 34.0 pg   MCHC 35.3 30.0 - 36.0 g/dL   RDW 11.913.8 14.711.5 - 82.915.5 %    Platelets 209 150 - 400 K/uL    Imaging Studies: [place] Mfm Ob Limited  Result Date: 05/14/2016 ----------------------------------------------------------------------  OBSTETRICS REPORT                      (Signed Final 05/14/2016 05:07 pm) ---------------------------------------------------------------------- Patient Info  ID #:       [id number]                         D.O.B.:   05/22/1985 (30 yrs)  Name:       Abigail Fox                 Visit Date:  05/14/2016 04:57 pm ---------------------------------------------------------------------- Performed By  Performed By:     Eden Lathearrie Stalter BS      [address]     [address]                                                             [address]  Attending:        Darlyn ReadEmily Bunce MD         [facility]  Referred By:      Judeth HornERIN LAWRENCE                    CNM ---------------------------------------------------------------------- Orders   #  Description                                 Code   1  US MFM OB LIMITED                           [id number]  ----------------------------------------------------------------------   #  Ordered By               Order #        Accession #    Episode #   1  Judeth HornERIN LAWRENCE            [id number]      [id number]     [id number]  ---------------------------------------------------------------------- Indications   [redacted] weeks gestation of pregnancy  Z3A.17   Absent fetal heart tones                       O76  ---------------------------------------------------------------------- OB History  Gravidity:    1         Term:   0        Prem:   0        SAB:   0  TOP:          0       Ectopic:  0        Living: 0 ---------------------------------------------------------------------- Fetal Evaluation  Num Of Fetuses:     1  Cardiac Activity:   Absent  Amniotic Fluid  AFI FV:      Severe Oligohydramnios ---------------------------------------------------------------------- Biometry  BPD:       32.2  mm     G. Age:  16w 0d          6  %                                                          FL/BPD:     50.0   %  FL:       16.1  mm     G. Age:  14w 5d        < 3  % ---------------------------------------------------------------------- Gestational Age  LMP:           17w 4d       Date:   01/12/16                 EDD:   10/18/16  U/S Today:     15w 3d                                        EDD:   11/02/16  Best:          17w 2d    Det. ByMarcella Dubs         EDD:   10/20/16                                      (02/26/16) ---------------------------------------------------------------------- Cervix Uterus Adnexa  Cervix  Closed  Uterus  No abnormality visualized.  Left Ovary  Not visualized.  Right Ovary  Within normal limits.  Cul De Sac:   No free fluid seen.  Adnexa:       No abnormality visualized. ---------------------------------------------------------------------- Impression  Fetal demise at [redacted]w[redacted]d by dates, measuring [redacted]w[redacted]d today.  Oligohydramnios. ---------------------------------------------------------------------- Recommendations  Primary provider present at the time of ultrasound and  informed patient of demise. ----------------------------------------------------------------------                   Darlyn Read, MD Electronically Signed Final Report   05/14/2016 05:07 pm ----------------------------------------------------------------------   Assessment: Patient Active Problem List   Diagnosis Date Noted  . Fetal demise, greater than 22 weeks, antepartum 05/14/2016  . Pregnancy complicated by subutex maintenance, antepartum (HCC) 04/11/2016  . Marijuana use 04/11/2016  . Subchorionic hematoma in first trimester 02/29/2016    Plan: Patient will undergo  surgical management with D&E under ultrasound guidance.   Risks of surgery including bleeding, infection, injury to surrounding organs, need for additional procedures, possibility of intrauterine scarring which may impair future  fertility, risk of retained products which may require further management and other postoperative/anesthesia complications were explained to patient.  Likelihood of success of complete evacuation of the uterus was discussed with the patient.  Written informed consent was obtained.  Patient has been NPO since 1200  she will remain NPO for procedure. Anesthesia and OR aware.  Preoperative prophylactic Doxycycline 200mg  IV  has been ordered and is on call to the OR.  To OR when ready.   Fetal demise evaluation labs were ordered, will follow up results and manage accordingly.  Jaynie Collins, M.D. 05/14/2016 7:22 PM

## 2016-05-14 NOTE — Progress Notes (Signed)
Chaplain arrived on unit and was briefed by pt's RN.  Pt has been crying loudly since learning the news of her fetal demise.  Chaplain entered room, greeted pt's significant other who has head down and is leaning against wall.  Pt is on phone and is speaking to her mother, attempting to convince her mother to come be with her.  As per pt's response, mother is not planning to come to hospital.  Pt asks chaplain to come back as soon as she finishes speaking with her mother, chaplain agrees.  Before leaving, chaplain offers hospitality to pt's significant other who accepts. Pt's significant other discusses ways of explaining this loss to his children.  Chaplain offered [employer] resources to pt's significant other as well as a brochure describing possible emotions experienced when losing a pregnancy prior to 20 weeks.  Significant other expresses appreciation and that no further spiritual care is needed at this point.  Chaplain encouraged pt and significant other to reach out to day chaplain in morning of needed as well as contact this chaplain for emergent needs this evening.  Pt is being prepped for surgery at 2000.  Please page on-call chaplain with any further needs.  05/14/2016 1907 Erroll Lunavercash, Khoury Siemon A, Chaplain

## 2016-05-14 NOTE — Transfer of Care (Signed)
Immediate Anesthesia Transfer of Care Note  Patient: Abigail AcostaChelsea Fox  Procedure(s) Performed: Procedure(s): DILATATION AND EVACUATION (D&E) 2ND TRIMESTER WITH ULTRA SOUND GUIDANCE (N/A)  Patient Location: PACU  Anesthesia Type:General  Level of Consciousness: awake, alert  and oriented  Airway & Oxygen Therapy: Patient Spontanous Breathing and Patient connected to nasal cannula oxygen  Post-op Assessment: Report given to RN and Post -op Vital signs reviewed and stable  Post vital signs: Reviewed  Last Vitals:  Vitals:   05/14/16 1547  BP: 128/78  Pulse: 88  Resp: 20  Temp: 37.3 C    Last Pain:  Vitals:   05/14/16 1607  TempSrc:   PainSc: 8       Patients Stated Pain Goal: 0 (05/14/16 1607)  Complications: No apparent anesthesia complications

## 2016-05-14 NOTE — MAU Note (Signed)
1530- back to triage, had to use restroom.    Pain in RLQ.  Can't keep anything down. Started up again 3 days ago.Can't find a dr, calls downstairs they say they will call back,  Never do, then when she calls they tell her  To just come back here. Hasn't been seen in 4 wks.

## 2016-05-14 NOTE — Discharge Instructions (Addendum)
Dilation and Curettage or Vacuum Curettage, Care After °This sheet gives you information about how to care for yourself after your procedure. Your health care provider may also give you more specific instructions. If you have problems or questions, contact your health care provider. °What can I expect after the procedure? °After your procedure, it is common to have: °· Mild pain or cramping. °· Some vaginal bleeding or spotting. °These may last for up to 2 weeks after your procedure. °Follow these instructions at home: °Activity  ° °· Do not drive or use heavy machinery while taking prescription pain medicine. °· Avoid driving for the first 24 hours after your procedure. °· Take frequent, short walks, followed by rest periods, throughout the day. Ask your health care provider what activities are safe for you. After 1-2 days, you may be able to return to your normal activities. °· Do not lift anything heavier than 10 lb (4.5 kg) until your health care provider approves. °· For at least 2 weeks, or as long as told by your health care provider, do not: °¨ Douche. °¨ Use tampons. °¨ Have sexual intercourse. °General instructions  ° °· Take over-the-counter and prescription medicines only as told by your health care provider. This is especially important if you take blood thinning medicine. °· Do not take baths, swim, or use a hot tub until your health care provider approves. Take showers instead of baths. °· Wear compression stockings as told by your health care provider. These stockings help to prevent blood clots and reduce swelling in your legs. °· It is your responsibility to get the results of your procedure. Ask your health care provider, or the department performing the procedure, when your results will be ready. °· Keep all follow-up visits as told by your health care provider. This is important. °Contact a health care provider if: °· You have severe cramps that get worse or that do not get better with  medicine. °· You have severe abdominal pain. °· You cannot drink fluids without vomiting. °· You develop pain in a different area of your pelvis. °· You have bad-smelling vaginal discharge. °· You have a rash. °Get help right away if: °· You have vaginal bleeding that soaks more than one sanitary pad in 1 hour, for 2 hours in a row. °· You pass large blood clots from your vagina. °· You have a fever that is above 100.4°F (38.0°C). °· Your abdomen feels very tender or hard. °· You have chest pain. °· You have shortness of breath. °· You cough up blood. °· You feel dizzy or light-headed. °· You faint. °· You have pain in your neck or shoulder area. °This information is not intended to replace advice given to you by your health care provider. Make sure you discuss any questions you have with your health care provider. °Document Released: 03/22/2000 Document Revised: 11/22/2015 Document Reviewed: 10/26/2015 °Elsevier Interactive Patient Education © 2017 [employer] ° ° ° ° °Post Anesthesia Home Care Instructions ° °Activity: °Get plenty of rest for the remainder of the day. A responsible adult should stay with you for 24 hours following the procedure.  °For the next 24 hours, DO NOT: °-Drive a car °-Operate machinery °-Drink alcoholic beverages °-Take any medication unless instructed by your physician °-Make any legal decisions or sign important papers. ° °Meals: °Start with liquid foods such as gelatin or soup. Progress to regular foods as tolerated. Avoid greasy, spicy, heavy foods. If nausea and/or vomiting occur, drink only clear liquids   until the nausea and/or vomiting subsides. Call your physician if vomiting continues. ° °Special Instructions/Symptoms: °Your throat may feel dry or sore from the anesthesia or the breathing tube placed in your throat during surgery. If this causes discomfort, gargle with warm salt water. The discomfort should disappear within 24 hours. ° °If you had a scopolamine patch placed  behind your ear for the management of post- operative nausea and/or vomiting: ° °1. The medication in the patch is effective for 72 hours, after which it should be removed.  Wrap patch in a tissue and discard in the trash. Wash hands thoroughly with soap and water. °2. You may remove the patch earlier than 72 hours if you experience unpleasant side effects which may include dry mouth, dizziness or visual disturbances. °3. Avoid touching the patch. Wash your hands with soap and water after contact with the patch. °  ° °

## 2016-05-14 NOTE — MAU Provider Note (Signed)
History     CSN: [id number]  Arrival date and time: 05/14/16 1510   First Provider Initiated Contact with Patient 05/14/16 1620      Chief Complaint  Patient presents with  . Emesis  . Abdominal Pain   HPI Abigail Fox is a 30 y.o. G1P0 at [redacted]w[redacted]d who presents with n/v & abdominal pain. Reports n/v throughout pregnancy. Has not taken antiemetic. States she hasn't been able to keep anything down in 3 days. Vomited once today.  Lower abdominal pain x 3 days. Pain worse in RLQ. Pain comes & goes. Describes as sharp. Rates pain 8/10. Has not treated. Denies fever/chills, anorexia, diarrhea, constipation, vaginal bleeding, vaginal discharge, or heartburn.  Was seen at [facility] once last month but stopped going to them d/t losing her insurance. Has not been able to find a new ob/gyn.   OB History    Gravida Para Term Preterm AB Living   1             SAB TAB Ectopic Multiple Live Births                  Past Medical History:  Diagnosis Date  . Anxiety   . Depression   . Headache     Past Surgical History:  Procedure Laterality Date  . TOOTH EXTRACTION      Family History  Problem Relation Age of Onset  . Diabetes Mother   . Hypertension Mother   . Heart disease Maternal Grandfather     Social History  Substance Use Topics  . Smoking status: Never Smoker  . Smokeless tobacco: Never Used  . Alcohol use No    Allergies: No Known Allergies  Prescriptions Prior to Admission  Medication Sig Dispense Refill Last Dose  . buprenorphine (SUBUTEX) 8 MG SUBL SL tablet Place 8 mg under the tongue daily.   04/10/2016 at Unknown time  . calcium carbonate (TUMS - DOSED IN MG ELEMENTAL CALCIUM) 500 MG chewable tablet Chew 1 tablet by mouth daily.   Past Week at Unknown time  . Prenatal Vit-Fe Fumarate-FA (PRENATAL MULTIVITAMIN) TABS tablet Take 1 tablet by mouth daily at 12 noon.   Past Week at Unknown time  . promethazine (PHENERGAN) 25 MG tablet Take 1 tablet (25 mg  total) by mouth every 6 (six) hours as needed for nausea or vomiting. (Patient not taking: Reported on 04/11/2016) 30 tablet 0 Not Taking at Unknown time  . valACYclovir (VALTREX) 1000 MG tablet Take 1,000 mg by mouth daily.   04/10/2016 at Unknown time    Review of Systems Physical Exam   Blood pressure 128/78, pulse 88, temperature 99.2 F (37.3 C), temperature source Oral, resp. rate 20, height 5\' 4"  (1.626 m), weight 242 lb 8 oz (110 kg), last menstrual period 01/12/2016.  Physical Exam  MAU Course  Procedures Results for orders placed or performed during the hospital encounter of 05/14/16 (from the past 24 hour(s))  Urinalysis, Routine w reflex microscopic     Status: Abnormal   Collection Time: 05/14/16  3:35 PM  Result Value Ref Range   Color, Urine AMBER (A) YELLOW   APPearance CLEAR CLEAR   Specific Gravity, Urine 1.017 1.005 - 1.030   pH 5.0 5.0 - 8.0   Glucose, UA NEGATIVE NEGATIVE mg/dL   Hgb urine dipstick SMALL (A) NEGATIVE   Bilirubin Urine NEGATIVE NEGATIVE   Ketones, ur NEGATIVE NEGATIVE mg/dL   Protein, ur NEGATIVE NEGATIVE mg/dL   Nitrite NEGATIVE NEGATIVE  Leukocytes, UA NEGATIVE NEGATIVE   RBC / HPF 0-5 0 - 5 RBC/hpf   WBC, UA 0-5 0 - 5 WBC/hpf   Bacteria, UA NONE SEEN NONE SEEN   Squamous Epithelial / LPF 0-5 (A) NONE SEEN   Mucous PRESENT    [place] Mfm Ob Limited  Result Date: 05/14/2016 ----------------------------------------------------------------------  OBSTETRICS REPORT                      (Signed Final 05/14/2016 05:07 pm) ---------------------------------------------------------------------- Patient Info  ID #:       [id number]                         D.O.B.:   [birth year] (30 yrs)  Name:       Abigail Fox                 Visit Date:  05/14/2016 04:57 pm ---------------------------------------------------------------------- Performed By  Performed By:     Eden Lathe BS      Ref. Address:     [address]                    [address]                                                              [address]  Attending:        Darlyn Read MD         Location:         [facility]  Referred By:      Judeth Horn                    CNM ---------------------------------------------------------------------- Orders   #  Description                                 Code   1  [place] MFM OB LIMITED                           [id number]  ----------------------------------------------------------------------   #  Ordered By               Order #        Accession #    Episode #   1  Judeth Horn            [id number]      [id number]     [id number]  ---------------------------------------------------------------------- Indications   [redacted] weeks gestation of pregnancy                Z3A.17   Absent fetal heart tones                       O76  ---------------------------------------------------------------------- OB History  Gravidity:    1         Term:   0        Prem:   0        SAB:   0  TOP:          0       Ectopic:  0  Living: 0 ---------------------------------------------------------------------- Fetal Evaluation  Num Of Fetuses:     1  Cardiac Activity:   Absent  Amniotic Fluid  AFI FV:      Severe Oligohydramnios ---------------------------------------------------------------------- Biometry  BPD:      32.2  mm     G. Age:  16w 0d          6  %                                                          FL/BPD:     50.0   %  FL:       16.1  mm     G. Age:  14w 5d        < 3  % ---------------------------------------------------------------------- Gestational Age  LMP:           17w 4d       Date:   01/12/16                 EDD:   10/18/16  U/S Today:     15w 3d                                        EDD:   11/02/16  Best:          17w 2d    Det. ByMarcella Dubs:   Early Ultrasound         EDD:   10/20/16                                      (02/26/16) ---------------------------------------------------------------------- Cervix Uterus Adnexa  Cervix  Closed  Uterus  No abnormality visualized.   Left Ovary  Not visualized.  Right Ovary  Within normal limits.  Cul De Sac:   No free fluid seen.  Adnexa:       No abnormality visualized. ---------------------------------------------------------------------- Impression  Fetal demise at 5833w2d by dates, measuring 10169w3d today.  Oligohydramnios. ---------------------------------------------------------------------- Recommendations  Primary provider present at the time of ultrasound and  informed patient of demise. ----------------------------------------------------------------------                   Darlyn ReadEmily Bunce, MD Electronically Signed Final Report   05/14/2016 05:07 pm ----------------------------------------------------------------------   MDM Unable to heart FHT by doppler by RN nor myself Bedside ultrasound confirms fetal demise. Pt & significant other very upset. Spiritual care called to bedside S/w Dr. Adrian BlackwaterStinson regarding ultrasound results. On unit to speak with patient  Assessment and Plan  A: Second trimester fetal demise  P: Dr. Macon LargeAnyanwu at bedside to discuss plan of care  Judeth Hornrin Shya Kovatch 05/14/2016, 4:21 PM

## 2016-05-14 NOTE — Op Note (Signed)
Abigail Fox PROCEDURE DATE:  05/14/2016  PREOPERATIVE DIAGNOSIS: Intrauterine fetal demise at 5951w4d, elevated temperature POSTOPERATIVE DIAGNOSIS: The same,large subchorionic hemorrhage PROCEDURE:  Dilation and Evacuation under ultrasound guidance SURGEON:  Dr. Jaynie CollinsUgonna Smera Guyette  INDICATIONS: 31 y.o.  G1P0 with intrauterine fetal demise at 3151w4d needing surgical management.  Risks of surgery were discussed with the patient and her friend including but not limited to: bleeding which may require transfusion; infection which may require antibiotics; injury to uterus or surrounding organs; need for additional procedures including laparotomy or laparoscopy; possibility of intrauterine scarring which may impair future fertility; and other postoperative/anesthesia complications. Written informed consent was obtained.  FINDINGS:   Intrauterine fetal demise at 251w4d. Significant amount of products of conception.  Empty endometrial stripe noted on ultrasound at the end of the procedure.   ANESTHESIA:    General and paracervical block with 30 ml of 0.5% Marcaine INTRAVENOUS FLUIDS:  1000ml of LR ESTIMATED BLOOD LOSS:  500 ml of blood and clots noted inside uterus. SPECIMENS:  Products of conception sent to pathology COMPLICATIONS:  None immediate.  PROCEDURE DETAILS:  The patient received intravenous Doxycycline while in the preoperative area.  She was then taken to the operating room whereanesthesia  was administered and was found to be adequate.  After an adequate timeout was performed, she was placed in the dorsal lithotomy position and examined; then prepped and draped in the sterile manner.   Her bladder was catheterized for an unmeasured amount of clear, yellow urine. A vaginal speculum was then placed in the patient's vagina and a single tooth tenaculum was applied to the anterior lip of the cervix.  A paracervical block using 30 ml of 0.5% Marcaine was administered. The cervix was gently dilated under  ultrasound guidance to accommodate a 16 mm suction curette that was gently advanced to the uterine fundus.  The suction device was then activated and curette slowly rotated to clear the uterus of products of conception.  A sharp curettage was then performed to confirm complete emptying of the uterus. There was an empty endometrial stripe noted on the ultrasound at the end of the curettage. Methergine 0.2 mg IM was administered to help prevent bleeding. There was minimal bleeding noted at the end of the procedure, and the tenaculum removed with good hemostasis noted.   All instruments were removed from the patient's vagina.  Sponge and instrument counts were correct times three.    The patient tolerated the procedure well and was taken to the recovery area awake, extubated and in stable condition.  The patient will be discharged to home as per PACU criteria.  Routine postoperative instructions given.  She was prescribed Percocet, Ibuprofen and Colace.  She will follow up in the clinic in 2-3 weeks for postoperative evaluation.    Jaynie CollinsUGONNA  [name], MD, FACOG Attending Obstetrician & Gynecologist Faculty Practice, [facility] [place]

## 2016-05-14 NOTE — Progress Notes (Signed)
I received a referral when baby's heart tones were not found.  I offered support and prayer in the period of waiting until they could get an ultrasound done.  Please page back if there is a need after the ultrasound comes back.  This is Brandilee's first baby and Henderson's 4th (though 2 are grown).  She reports that they have had difficulty finding heart tones at visits before.  This gave her some hope that perhaps all would be fine on the ultrasound.  Chaplain Dyanne CarrelKaty Fritzie Prioleau, Bcc Pager, [phone] 4:40 PM    05/14/16 1604  Clinical Encounter Type  Visited With Patient and family together  Visit Type Spiritual support  Referral From Nurse  Advance Directives (For Healthcare)  Does Patient Have a Medical Advance Directive? No  Would patient like information on creating a medical advance directive? No - Patient declined  Mental Health Advance Directives  Does Patient Have a Mental Health Advance Directive? No  Would patient like information on creating a mental health advance directive? No - Patient declined

## 2016-05-14 NOTE — Anesthesia Preprocedure Evaluation (Addendum)
Anesthesia Evaluation  Patient identified by MRN, date of birth, ID band Patient awake    Reviewed: Allergy & Precautions, Patient's Chart, lab work & pertinent test results  Airway Mallampati: III       Dental no notable dental hx. (+) Partial Upper, Poor Dentition   Pulmonary neg pulmonary ROS,    Pulmonary exam normal breath sounds clear to auscultation       Cardiovascular negative cardio ROS Normal cardiovascular exam Rhythm:Regular Rate:Normal     Neuro/Psych  Headaches, PSYCHIATRIC DISORDERS Anxiety Depression    GI/Hepatic (+)     substance abuse  marijuana use, Hx/o Opiate abuse On subutex Nausea and vomiting   Endo/Other  Morbid obesity  Renal/GU negative Renal ROS  negative genitourinary   Musculoskeletal negative musculoskeletal ROS (+)   Abdominal (+) + obese,   Peds  Hematology  (+) anemia ,   Anesthesia Other Findings   Reproductive/Obstetrics (+) Pregnancy 15 weeks by US, 17 weeks by dates HSV                            Lab Results  Component Value Date   WBC 17.7 (H) 05/14/2016   HGB 9.7 (L) 05/14/2016   HCT 27.5 (L) 05/14/2016   MCV 87.6 05/14/2016   PLT 209 05/14/2016    Anesthesia Physical Anesthesia Plan  ASA: III and emergent  Anesthesia Plan: General   Post-op Pain Management:    Induction: Intravenous, Rapid sequence and Cricoid pressure planned  Airway Management Planned: Oral ETT  Additional Equipment:   Intra-op Plan:   Post-operative Plan: Extubation in OR  Informed Consent: I have reviewed the patients History and Physical, chart, labs and discussed the procedure including the risks, benefits and alternatives for the proposed anesthesia with the patient or authorized representative who has indicated his/her understanding and acceptance.   Dental advisory given  Plan Discussed with: Anesthesiologist, CRNA and Surgeon  Anesthesia  Plan Comments:         Anesthesia Quick Evaluation

## 2016-05-14 NOTE — MAU Note (Signed)
Chaplain at bedside

## 2016-05-15 ENCOUNTER — Encounter (HOSPITAL_COMMUNITY): Payer: Self-pay | Admitting: Obstetrics & Gynecology

## 2016-05-15 LAB — LUPUS ANTICOAGULANT PANEL
DRVVT: 39.3 s (ref 0.0–47.0)
PTT Lupus Anticoagulant: 35.5 s (ref 0.0–51.9)

## 2016-05-15 LAB — HEMOGLOBIN A1C
HEMOGLOBIN A1C: 5 % (ref 4.8–5.6)
MEAN PLASMA GLUCOSE: 97 mg/dL

## 2016-05-15 LAB — CARDIOLIPIN ANTIBODIES, IGG, IGM, IGA
Anticardiolipin IgG: <9 GPL U/mL (ref 0–14)
Anticardiolipin IgM: <9 MPL U/mL (ref 0–12)

## 2016-05-16 ENCOUNTER — Encounter: Payer: Self-pay | Admitting: General Practice

## 2016-05-17 LAB — PROTHROMBIN GENE MUTATION

## 2016-05-17 LAB — FACTOR 5 LEIDEN

## 2016-05-18 ENCOUNTER — Emergency Department (HOSPITAL_COMMUNITY)
Admission: EM | Admit: 2016-05-18 | Discharge: 2016-05-19 | Disposition: A | Discharge status: Home / Self Care | Payer: Medicaid Other | Attending: Emergency Medicine | Admitting: Emergency Medicine

## 2016-05-18 ENCOUNTER — Encounter (HOSPITAL_COMMUNITY): Payer: Self-pay

## 2016-05-18 DIAGNOSIS — M7989 Other specified soft tissue disorders: Secondary | ICD-10-CM | POA: Diagnosis present

## 2016-05-18 DIAGNOSIS — Z79899 Other long term (current) drug therapy: Secondary | ICD-10-CM | POA: Diagnosis not present

## 2016-05-18 NOTE — ED Triage Notes (Signed)
States had a miscarriage this week and now lots of blood clots and legs swelling.

## 2016-05-19 ENCOUNTER — Ambulatory Visit (HOSPITAL_COMMUNITY)
Admission: RE | Admit: 2016-05-19 | Discharge: 2016-05-19 | Disposition: A | Discharge status: Home / Self Care | Payer: Medicaid Other | Source: Ambulatory Visit | Attending: Emergency Medicine | Admitting: Emergency Medicine

## 2016-05-19 ENCOUNTER — Encounter (HOSPITAL_COMMUNITY): Payer: Self-pay | Admitting: Emergency Medicine

## 2016-05-19 DIAGNOSIS — R609 Edema, unspecified: Secondary | ICD-10-CM | POA: Diagnosis not present

## 2016-05-19 DIAGNOSIS — M7989 Other specified soft tissue disorders: Secondary | ICD-10-CM | POA: Insufficient documentation

## 2016-05-19 LAB — HEPATIC FUNCTION PANEL
ALBUMIN: 3.3 g/dL — AB (ref 3.5–5.0)
ALK PHOS: 52 U/L (ref 38–126)
ALT: 32 U/L (ref 14–54)
AST: 28 U/L (ref 15–41)
BILIRUBIN TOTAL: 0.2 mg/dL — AB (ref 0.3–1.2)
Total Protein: 6.3 g/dL — ABNORMAL LOW (ref 6.5–8.1)

## 2016-05-19 LAB — CBC WITH DIFFERENTIAL/PLATELET
BASOS ABS: 0 10*3/uL (ref 0.0–0.1)
BASOS PCT: 0 %
EOS ABS: 0.3 10*3/uL (ref 0.0–0.7)
EOS PCT: 3 %
HEMATOCRIT: 24.7 % — AB (ref 36.0–46.0)
Hemoglobin: 8.6 g/dL — ABNORMAL LOW (ref 12.0–15.0)
Lymphocytes Relative: 29 %
Lymphs Abs: 3.3 10*3/uL (ref 0.7–4.0)
MCH: 31.5 pg (ref 26.0–34.0)
MCHC: 34.8 g/dL (ref 30.0–36.0)
MCV: 90.5 fL (ref 78.0–100.0)
Monocytes Absolute: 0.8 10*3/uL (ref 0.1–1.0)
Monocytes Relative: 7 %
Neutro Abs: 6.8 10*3/uL (ref 1.7–7.7)
Neutrophils Relative %: 61 %
PLATELETS: 183 10*3/uL (ref 150–400)
RBC: 2.73 MIL/uL — ABNORMAL LOW (ref 3.87–5.11)
RDW: 14.7 % (ref 11.5–15.5)
WBC: 11.3 10*3/uL — ABNORMAL HIGH (ref 4.0–10.5)

## 2016-05-19 LAB — URINALYSIS, ROUTINE W REFLEX MICROSCOPIC
BILIRUBIN URINE: NEGATIVE
GLUCOSE, UA: NEGATIVE mg/dL
Ketones, ur: NEGATIVE mg/dL
Nitrite: NEGATIVE
PH: 5 (ref 5.0–8.0)
Protein, ur: NEGATIVE mg/dL
SPECIFIC GRAVITY, URINE: 1.012 (ref 1.005–1.030)

## 2016-05-19 LAB — I-STAT CHEM 8, ED
BUN: 12 mg/dL (ref 6–20)
CREATININE: 0.8 mg/dL (ref 0.44–1.00)
Calcium, Ion: 1.18 mmol/L (ref 1.15–1.40)
Chloride: 104 mmol/L (ref 101–111)
Glucose, Bld: 100 mg/dL — ABNORMAL HIGH (ref 65–99)
HEMATOCRIT: 24 % — AB (ref 36.0–46.0)
HEMOGLOBIN: 8.2 g/dL — AB (ref 12.0–15.0)
POTASSIUM: 4.9 mmol/L (ref 3.5–5.1)
SODIUM: 139 mmol/L (ref 135–145)
TCO2: 30 mmol/L (ref 0–100)

## 2016-05-19 MED ORDER — ENOXAPARIN SODIUM 120 MG/0.8ML ~~LOC~~ SOLN
1.0000 mg/kg | SUBCUTANEOUS | Status: AC
  Administered 2016-05-19: 110 mg via SUBCUTANEOUS
  Filled 2016-05-19: qty 0.8

## 2016-05-19 MED ORDER — KETOROLAC TROMETHAMINE 30 MG/ML IJ SOLN
30.0000 mg | Freq: Once | INTRAMUSCULAR | Status: DC
  Filled 2016-05-19: qty 1

## 2016-05-19 MED ORDER — KETOROLAC TROMETHAMINE 30 MG/ML IJ SOLN
30.0000 mg | Freq: Once | INTRAMUSCULAR | Status: AC
  Administered 2016-05-19: 30 mg via INTRAMUSCULAR

## 2016-05-19 MED ORDER — ENOXAPARIN SODIUM 120 MG/0.8ML ~~LOC~~ SOLN
1.0000 mg/kg | SUBCUTANEOUS | Status: DC
  Filled 2016-05-19 (×2): qty 0.8

## 2016-05-19 NOTE — ED Provider Notes (Signed)
[facility]-EMERGENCY DEPT Provider Note   CSN: [id number] Arrival date & time: 05/18/16  2010 By signing my name below, I, Levon Hedger, attest that this documentation has been prepared under the direction and in the presence of Humna Moorehouse, MD . Electronically Signed: Levon Hedger, Scribe. 05/19/2016. 2:33 AM.   History   Chief Complaint Chief Complaint  Patient presents with  . Leg Swelling    had miscarriage this week with lots of blood clots.   HPI Abigail Fox is a 31 y.o. female with a h/o pregnancy complicated by subutex maintenance and fetal demise who presents to the Emergency Department complaining of sudden onset, constant leg swelling which began yesterday. Pt is four days s/p D&E performed by Dr. Macon Large; per chart review fetal gestation was 17 weeks. She reports associated tingling in her BLE, severe abdominal pain and generalized pain. No alleviating or modifying factors noted. Pt has taken oxycodone for the pain with no relief.  [place] controlled substance database reveals pt was Rx 30 Percocet on 2/6 and 56 subutex on 1/9. Pt is also on suboxone.  Pt has no other complaints or symptoms at this time.    The history is provided by the patient and medical records. No language interpreter was used.  Leg Pain   This is a new problem. The current episode started yesterday. The problem occurs constantly. The problem has not changed since onset.The pain is present in the right lower leg and left lower leg. The quality of the pain is described as aching. The pain is severe. Pertinent negatives include no numbness. Treatments tried: narcotics. There has been no history of extremity trauma. Family history is significant for no rheumatoid arthritis.   Past Medical History:  Diagnosis Date  . Anxiety   . Depression   . Headache     Patient Active Problem List   Diagnosis Date Noted  . Fetal demise, greater than 22 weeks, antepartum 05/14/2016  . Pregnancy complicated by subutex  maintenance, antepartum (HCC) 04/11/2016  . Marijuana use 04/11/2016  . Subchorionic hematoma in first trimester 02/29/2016   Past Surgical History:  Procedure Laterality Date  . DILATION AND EVACUATION N/A 05/14/2016   Procedure: DILATATION AND EVACUATION (D&E) 2ND TRIMESTER WITH ULTRA SOUND GUIDANCE;  Surgeon: Tereso Newcomer, MD;  Location: [facility];  Service: Gynecology;  Laterality: N/A;  . miscarriage    . TOOTH EXTRACTION      OB History    Gravida Para Term Preterm AB Living   1             SAB TAB Ectopic Multiple Live Births                   Home Medications    Prior to Admission medications   Medication Sig Start Date End Date Taking? Authorizing Provider  buprenorphine (SUBUTEX) 8 MG SUBL SL tablet Place 4 mg under the tongue 2 (two) times daily.    Yes Historical Provider, MD  calcium carbonate (TUMS - DOSED IN MG ELEMENTAL CALCIUM) 500 MG chewable tablet Chew 1 tablet by mouth daily as needed for heartburn.    Yes Historical Provider, MD  ibuprofen (ADVIL,MOTRIN) 600 MG tablet Take 1 tablet (600 mg total) by mouth every 6 (six) hours as needed. Patient taking differently: Take 600 mg by mouth every 6 (six) hours as needed for headache, mild pain or moderate pain.  05/14/16  Yes Tereso Newcomer, MD  oxyCODONE-acetaminophen (PERCOCET/ROXICET) 5-325 MG tablet Take 1-2 tablets by  mouth every 6 (six) hours as needed. Patient taking differently: Take 1-2 tablets by mouth every 6 (six) hours as needed for moderate pain or severe pain.  05/14/16  Yes Tereso NewcomerUgonna A Anyanwu, MD  docusate sodium (COLACE) 100 MG capsule Take 1 capsule (100 mg total) by mouth 2 (two) times daily as needed. Patient not taking: Reported on 05/18/2016 05/14/16   Tereso NewcomerUgonna A Anyanwu, MD  promethazine (PHENERGAN) 25 MG tablet Take 1 tablet (25 mg total) by mouth every 6 (six) hours as needed for nausea or vomiting. Patient not taking: Reported on 05/18/2016 05/14/16   Tereso NewcomerUgonna A Anyanwu, MD    Family History Family  History  Problem Relation Age of Onset  . Diabetes Mother   . Hypertension Mother   . Heart disease Maternal Grandfather     Social History Social History  Substance Use Topics  . Smoking status: Never Smoker  . Smokeless tobacco: Never Used  . Alcohol use No    Allergies   Patient has no known allergies.  Review of Systems Review of Systems  Cardiovascular: Positive for leg swelling.  Gastrointestinal: Positive for abdominal pain.  Musculoskeletal: Positive for myalgias.  Neurological: Negative for numbness.  All other systems reviewed and are negative.  Physical Exam Updated Vital Signs BP 134/87 (BP Location: Left Arm)   Pulse 78   Temp 98.7 F (37.1 C) (Oral)   Resp 18   Ht 5\' 4"  (1.626 m)   Wt 242 lb (109.8 kg)   LMP 01/12/2016 (Approximate)   SpO2 99%   BMI 41.54 kg/m   Physical Exam  Constitutional: She is oriented to person, place, and time. She appears well-developed and well-nourished. No distress.  HENT:  Head: Normocephalic and atraumatic.  Mouth/Throat: Oropharynx is clear and moist. No oropharyngeal exudate.  Moist mucous membranes   Eyes: Conjunctivae are normal. Pupils are equal, round, and reactive to light.  Neck: Normal range of motion. Neck supple. No JVD present.  Trachea midline No bruit  Cardiovascular: Normal rate, regular rhythm, normal heart sounds and intact distal pulses.   Pulmonary/Chest: Effort normal and breath sounds normal. No stridor. No respiratory distress.  Abdominal: Soft. Bowel sounds are normal. She exhibits no distension and no mass. There is no tenderness. There is no rebound and no guarding.  Musculoskeletal: She exhibits edema (to the knees, nonpitting). She exhibits no deformity.  All compartments soft. Achille's tendon intact bilaterally.  Neurological: She is alert and oriented to person, place, and time. She has normal reflexes.  Skin: Skin is warm and dry. Capillary refill takes less than 2 seconds.    Psychiatric: She has a normal mood and affect. Her behavior is normal.  Nursing note and vitals reviewed.  ED Treatments / Results   Vitals:   05/18/16 2358 05/19/16 0126  BP: 136/87 134/87  Pulse: 80 78  Resp: 20 18  Temp: 98.7 F (37.1 C)     DIAGNOSTIC STUDIES: Results for orders placed or performed during the hospital encounter of 05/18/16  CBC with Differential/Platelet  Result Value Ref Range   WBC 11.3 (H) 4.0 - 10.5 K/uL   RBC 2.73 (L) 3.87 - 5.11 MIL/uL   Hemoglobin 8.6 (L) 12.0 - 15.0 g/dL   HCT 95.224.7 (L) 84.136.0 - 32.446.0 %   MCV 90.5 78.0 - 100.0 fL   MCH 31.5 26.0 - 34.0 pg   MCHC 34.8 30.0 - 36.0 g/dL   RDW 40.114.7 02.711.5 - 25.315.5 %   Platelets 183 150 - 400 K/uL  Neutrophils Relative % 61 %   Neutro Abs 6.8 1.7 - 7.7 K/uL   Lymphocytes Relative 29 %   Lymphs Abs 3.3 0.7 - 4.0 K/uL   Monocytes Relative 7 %   Monocytes Absolute 0.8 0.1 - 1.0 K/uL   Eosinophils Relative 3 %   Eosinophils Absolute 0.3 0.0 - 0.7 K/uL   Basophils Relative 0 %   Basophils Absolute 0.0 0.0 - 0.1 K/uL  Urinalysis, Routine w reflex microscopic  Result Value Ref Range   Color, Urine YELLOW YELLOW   APPearance CLEAR CLEAR   Specific Gravity, Urine 1.012 1.005 - 1.030   pH 5.0 5.0 - 8.0   Glucose, UA NEGATIVE NEGATIVE mg/dL   Hgb urine dipstick LARGE (A) NEGATIVE   Bilirubin Urine NEGATIVE NEGATIVE   Ketones, ur NEGATIVE NEGATIVE mg/dL   Protein, ur NEGATIVE NEGATIVE mg/dL   Nitrite NEGATIVE NEGATIVE   Leukocytes, UA SMALL (A) NEGATIVE   RBC / HPF TOO NUMEROUS TO COUNT 0 - 5 RBC/hpf   WBC, UA 6-30 0 - 5 WBC/hpf   Bacteria, UA RARE (A) NONE SEEN   Squamous Epithelial / LPF 0-5 (A) NONE SEEN   Mucous PRESENT   Hepatic function panel  Result Value Ref Range   Total Protein 6.3 (L) 6.5 - 8.1 g/dL   Albumin 3.3 (L) 3.5 - 5.0 g/dL   AST 28 15 - 41 U/L   ALT 32 14 - 54 U/L   Alkaline Phosphatase 52 38 - 126 U/L   Total Bilirubin 0.2 (L) 0.3 - 1.2 mg/dL   Bilirubin, Direct <1.6 (L) 0.1 -  0.5 mg/dL   Indirect Bilirubin NOT CALCULATED 0.3 - 0.9 mg/dL  I-stat chem 8, ed  Result Value Ref Range   Sodium 139 135 - 145 mmol/L   Potassium 4.9 3.5 - 5.1 mmol/L   Chloride 104 101 - 111 mmol/L   BUN 12 6 - 20 mg/dL   Creatinine, Ser 1.09 0.44 - 1.00 mg/dL   Glucose, Bld 604 (H) 65 - 99 mg/dL   Calcium, Ion 5.40 9.81 - 1.40 mmol/L   TCO2 30 0 - 100 mmol/L   Hemoglobin 8.2 (L) 12.0 - 15.0 g/dL   HCT 19.1 (L) 47.8 - 29.5 %   [place] Intraoperative  Result Date: 05/14/2016 CLINICAL DATA:  Ultrasound was provided for use by the ordering physician, and a technical charge was applied by the performing facility.  No radiologist interpretation/professional services rendered.   [place] Mfm Ob Limited  Result Date: 05/14/2016 ----------------------------------------------------------------------  OBSTETRICS REPORT                      (Signed Final 05/14/2016 05:07 pm) ---------------------------------------------------------------------- Patient Info  ID #:       [id number]                         D.O.B.:   Jun 29, 1985 (30 yrs)  Name:       Abigail Fox                 Visit Date:  05/14/2016 04:57 pm ---------------------------------------------------------------------- Performed By  Performed By:     Eden Lathe BS      Ref. Address:     [address]                    [address]  Attending:        Darlyn Read MD         Location:         [facility]  Referred By:      Judeth Horn                    CNM ---------------------------------------------------------------------- Orders   #  Description                                 Code   1  [place] MFM OB LIMITED                           [phone]  ----------------------------------------------------------------------   #  Ordered By               Order #        Accession #    Episode #   1  Judeth Horn            [id number]      [id number]     [id number]   ---------------------------------------------------------------------- Indications   [redacted] weeks gestation of pregnancy                Z3A.17   Absent fetal heart tones                       O76  ---------------------------------------------------------------------- OB History  Gravidity:    1         Term:   0        Prem:   0        SAB:   0  TOP:          0       Ectopic:  0        Living: 0 ---------------------------------------------------------------------- Fetal Evaluation  Num Of Fetuses:     1  Cardiac Activity:   Absent  Amniotic Fluid  AFI FV:      Severe Oligohydramnios ---------------------------------------------------------------------- Biometry  BPD:      32.2  mm     G. Age:  16w 0d          6  %                                                          FL/BPD:     50.0   %  FL:       16.1  mm     G. Age:  14w 5d        < 3  % ---------------------------------------------------------------------- Gestational Age  LMP:           17w 4d       Date:   01/12/16                 EDD:   10/18/16  U/S Today:     15w 3d                                        EDD:   11/02/16  Best:  17w 2d    Det. ByMarcella Dubs         EDD:   10/20/16                                      (02/26/16) ---------------------------------------------------------------------- Cervix Uterus Adnexa  Cervix  Closed  Uterus  No abnormality visualized.  Left Ovary  Not visualized.  Right Ovary  Within normal limits.  Cul De Sac:   No free fluid seen.  Adnexa:       No abnormality visualized. ---------------------------------------------------------------------- Impression  Fetal demise at [redacted]w[redacted]d by dates, measuring [redacted]w[redacted]d today.  Oligohydramnios. ---------------------------------------------------------------------- Recommendations  Primary provider present at the time of ultrasound and  informed patient of demise. ----------------------------------------------------------------------                   Darlyn Read, MD  Electronically Signed Final Report   05/14/2016 05:07 pm ----------------------------------------------------------------------   Oxygen Saturation is 100% on RA, normal by my interpretation.    COORDINATION OF CARE:  12:30 AM Discussed treatment plan with pt at bedside and pt agreed to plan.   Labs (all labs ordered are listed, but only abnormal results are displayed) Labs Reviewed  CBC WITH DIFFERENTIAL/PLATELET - Abnormal; Notable for the following:       Result Value   WBC 11.3 (*)    RBC 2.73 (*)    Hemoglobin 8.6 (*)    HCT 24.7 (*)    All other components within normal limits  URINALYSIS, ROUTINE W REFLEX MICROSCOPIC - Abnormal; Notable for the following:    Hgb urine dipstick LARGE (*)    Leukocytes, UA SMALL (*)    Bacteria, UA RARE (*)    Squamous Epithelial / LPF 0-5 (*)    All other components within normal limits  HEPATIC FUNCTION PANEL - Abnormal; Notable for the following:    Total Protein 6.3 (*)    Albumin 3.3 (*)    Total Bilirubin 0.2 (*)    Bilirubin, Direct <0.1 (*)    All other components within normal limits  I-STAT CHEM 8, ED - Abnormal; Notable for the following:    Glucose, Bld 100 (*)    Hemoglobin 8.2 (*)    HCT 24.0 (*)    All other components within normal limits    Procedures Procedures (including critical care time)  Medications Ordered in ED  Medications  ketorolac (TORADOL) 30 MG/ML injection 30 mg (not administered)  Lovenox per pharmacy consult    Initial Impression / Assessment and Plan / ED Course  I have reviewed the triage vital signs and the nursing notes.  Pertinent labs & imaging results that were available during my care of the patient were reviewed by me and considered in my medical decision making (see chart for details).  Clinical Course as of May 20 239  Sun May 19, 2016  0239 Per Dr. Shawnie Pons, you can't develop preeclampsia at less than 22 weeks and pregnancy must be viable. Will administer Toradol and pt [facility]   [EH]    Clinical Course User Index [EH] Levon Hedger   Per Dr. Shawnie Pons schedule outpatient dopplers    Final Clinical Impressions(s) / ED Diagnoses  Leg pain:  Set up for outpatient dopplers, given lovenox in the ED.  See your pain management specialist for ongoing pain medication.  All questions answered to patient's satisfaction. Based on history  and exam patient has been appropriately medically screened and emergency conditions excluded. Patient is stable for discharge at this time. Strict return precautions given for any further episodes, persistent fever, weakness or any concerns. New Prescriptions New Prescriptions   No medications on file  I personally performed the services described in this documentation, which was scribed in my presence. The recorded information has been reviewed and is accurate.      Cy Blamer, MD 05/19/16 [phone]

## 2016-06-07 LAB — CHROMOSOME STD, POC(TISSUE)-NCBH

## 2016-06-10 ENCOUNTER — Encounter: Payer: Self-pay | Admitting: Obstetrics & Gynecology

## 2016-06-10 ENCOUNTER — Ambulatory Visit (INDEPENDENT_AMBULATORY_CARE_PROVIDER_SITE_OTHER): Payer: Medicaid Other | Admitting: Obstetrics & Gynecology

## 2016-06-10 VITALS — BP 120/67 | HR 73 | Wt 248.7 lb

## 2016-06-10 DIAGNOSIS — O364XX0 Maternal care for intrauterine death, not applicable or unspecified: Secondary | ICD-10-CM

## 2016-06-10 NOTE — Progress Notes (Signed)
   Subjective:       Abigail Fox is a 31 y.o. female who presents to the clinic 4 weeks status post. Eating a regular diet with difficulty. Bowel movements are normal. The patient is not having any pain.  The following portions of the patient's history were reviewed and updated as appropriate: allergies, current medications, past family history, past medical history, past social history, past surgical history and problem list.  Cannot remember when last pap smear was.   Review of Systems Pertinent items noted in HPI and remainder of comprehensive ROS otherwise negative.    Objective:    BP 120/67   Pulse 73   Wt 248 lb 11.2 oz (112.8 kg)   LMP 01/12/2016 (Approximate)   Breastfeeding? Unknown   BMI 42.69 kg/m  General:  alert and no distress  Abdomen: soft, bowel sounds active, non-tender  Pelvic:   Deferred    Pathology Diagnosis Products of Conception PREMATURE PLACENTA WITH THREE VESSEL CORD. FETAL TISSUE IS PRESENT Assessment:    Doing well postoperatively. Operative findings again reviewed. Pathology report discussed.    Plan:   1. Continue any current medications. 2. Wound care discussed. 3. Activity restrictions: none 4. Anticipated return to work: not applicable. 5. Follow up: 1 month for annual.  Jaynie CollinsUGONNA  Mittie Knittel, MD, FACOG Attending Obstetrician & Gynecologist, [facility], [facility]

## 2016-07-09 ENCOUNTER — Encounter: Payer: Medicaid Other | Admitting: Obstetrics & Gynecology

## 2016-10-22 ENCOUNTER — Encounter (HOSPITAL_COMMUNITY): Payer: Self-pay

## 2016-10-22 ENCOUNTER — Inpatient Hospital Stay (HOSPITAL_COMMUNITY)
Admission: AD | Admit: 2016-10-22 | Discharge: 2016-10-22 | Disposition: A | Discharge status: Home / Self Care | Payer: Medicaid Other | Source: Ambulatory Visit | Attending: Family Medicine | Admitting: Family Medicine

## 2016-10-22 ENCOUNTER — Inpatient Hospital Stay (HOSPITAL_COMMUNITY): Payer: Medicaid Other

## 2016-10-22 DIAGNOSIS — R102 Pelvic and perineal pain: Secondary | ICD-10-CM

## 2016-10-22 DIAGNOSIS — Z3A01 Less than 8 weeks gestation of pregnancy: Secondary | ICD-10-CM | POA: Insufficient documentation

## 2016-10-22 DIAGNOSIS — O26891 Other specified pregnancy related conditions, first trimester: Secondary | ICD-10-CM | POA: Diagnosis not present

## 2016-10-22 DIAGNOSIS — R1032 Left lower quadrant pain: Secondary | ICD-10-CM | POA: Insufficient documentation

## 2016-10-22 DIAGNOSIS — O219 Vomiting of pregnancy, unspecified: Secondary | ICD-10-CM

## 2016-10-22 LAB — COMPREHENSIVE METABOLIC PANEL
ALT: 74 U/L — ABNORMAL HIGH (ref 14–54)
ANION GAP: 11 (ref 5–15)
AST: 47 U/L — ABNORMAL HIGH (ref 15–41)
Albumin: 4.1 g/dL (ref 3.5–5.0)
Alkaline Phosphatase: 61 U/L (ref 38–126)
BUN: 11 mg/dL (ref 6–20)
CALCIUM: 9.8 mg/dL (ref 8.9–10.3)
CHLORIDE: 103 mmol/L (ref 101–111)
CO2: 22 mmol/L (ref 22–32)
Creatinine, Ser: 0.8 mg/dL (ref 0.44–1.00)
GFR calc non Af Amer: >60 mL/min (ref >60)
Glucose, Bld: 118 mg/dL — ABNORMAL HIGH (ref 65–99)
POTASSIUM: 4.4 mmol/L (ref 3.5–5.1)
SODIUM: 136 mmol/L (ref 135–145)
Total Bilirubin: 0.3 mg/dL (ref 0.3–1.2)
Total Protein: 7.9 g/dL (ref 6.5–8.1)

## 2016-10-22 LAB — URINALYSIS, ROUTINE W REFLEX MICROSCOPIC
Bilirubin Urine: NEGATIVE
Glucose, UA: NEGATIVE mg/dL
Hgb urine dipstick: NEGATIVE
Ketones, ur: 20 mg/dL — AB
Leukocytes, UA: NEGATIVE
NITRITE: NEGATIVE
PH: 5 (ref 5.0–8.0)
Protein, ur: 30 mg/dL — AB
SPECIFIC GRAVITY, URINE: 1.021 (ref 1.005–1.030)

## 2016-10-22 LAB — CBC
HCT: 36.9 % (ref 36.0–46.0)
Hemoglobin: 12.7 g/dL (ref 12.0–15.0)
MCH: 29.9 pg (ref 26.0–34.0)
MCHC: 34.4 g/dL (ref 30.0–36.0)
MCV: 86.8 fL (ref 78.0–100.0)
PLATELETS: 172 10*3/uL (ref 150–400)
RBC: 4.25 MIL/uL (ref 3.87–5.11)
RDW: 14.3 % (ref 11.5–15.5)
WBC: 11.5 10*3/uL — ABNORMAL HIGH (ref 4.0–10.5)

## 2016-10-22 LAB — WET PREP, GENITAL
Clue Cells Wet Prep HPF POC: NONE SEEN
SPERM: NONE SEEN
TRICH WET PREP: NONE SEEN
YEAST WET PREP: NONE SEEN

## 2016-10-22 LAB — POCT PREGNANCY, URINE: PREG TEST UR: POSITIVE — AB

## 2016-10-22 LAB — HCG, QUANTITATIVE, PREGNANCY: HCG, BETA CHAIN, QUANT, S: 24354 m[IU]/mL — AB (ref <5)

## 2016-10-22 MED ORDER — PROMETHAZINE HCL 25 MG/ML IJ SOLN
25.0000 mg | Freq: Once | INTRAMUSCULAR | Status: AC
  Administered 2016-10-22: 25 mg via INTRAVENOUS
  Filled 2016-10-22: qty 1

## 2016-10-22 MED ORDER — ONDANSETRON HCL 4 MG/2ML IJ SOLN
4.0000 mg | Freq: Once | INTRAMUSCULAR | Status: AC
  Administered 2016-10-22: 4 mg via INTRAVENOUS
  Filled 2016-10-22: qty 2

## 2016-10-22 MED ORDER — PROMETHAZINE HCL 25 MG PO TABS: 12.5000 mg | ORAL_TABLET | Freq: Four times a day (QID) | ORAL | 0 refills | Status: DC | PRN

## 2016-10-22 MED ORDER — LACTATED RINGERS IV SOLN
INTRAVENOUS | Status: DC
  Administered 2016-10-22: 20:00:00 via INTRAVENOUS

## 2016-10-22 MED ORDER — M.V.I. ADULT IV INJ
Freq: Once | INTRAVENOUS | Status: DC
  Filled 2016-10-22: qty 10

## 2016-10-22 MED ORDER — DEXTROSE IN LACTATED RINGERS 5 % IV SOLN
INTRAVENOUS | Status: DC
  Administered 2016-10-22: 21:00:00 via INTRAVENOUS

## 2016-10-22 NOTE — MAU Note (Signed)
Pt in lobby, very vocal, complaining of sharp stabbing pain in lower abd. Found out she was preg yesterday.  Had a loss 4-5 months ago.

## 2016-10-22 NOTE — MAU Note (Signed)
Left sided abdominal pain since last night, nausea and vomitting. No bleeding. "Pinkish stuff last week" when she wiped.

## 2016-10-22 NOTE — MAU Provider Note (Signed)
Chief Complaint: Abdominal Pain   First Provider Initiated Contact with Patient 10/22/16 1921        SUBJECTIVE HPI: Abigail Fox is a 31 y.o. G3P0110 at [redacted]w[redacted]d by LMP who presents to maternity admissions reporting left lower abdominal pain. Pain comes and goes in waves.   Also has nausea and vomiting.  No bleeding today. Had some pink discharge last week. . She denies vaginal bleeding, vaginal itching/burning, urinary symptoms, h/a, dizziness, or fever/chills.    Abdominal Pain  This is a new problem. The current episode started yesterday. The problem occurs intermittently. The problem has been unchanged. The pain is located in the LLQ. The pain is severe (at times other times she appears compfortable). The quality of the pain is cramping. The abdominal pain does not radiate. Associated symptoms include nausea and vomiting. Pertinent negatives include no constipation, diarrhea, dysuria, fever or myalgias. Nothing aggravates the pain. The pain is relieved by nothing. She has tried nothing for the symptoms.    RN Note: Left sided abdominal pain since last night, nausea and vomitting. No bleeding. "Pinkish stuff last week" when she wiped.   Past Medical History:  Diagnosis Date  . Anxiety   . Depression   . Headache    Past Surgical History:  Procedure Laterality Date  . DILATION AND EVACUATION N/A 05/14/2016   Procedure: DILATATION AND EVACUATION (D&E) 2ND TRIMESTER WITH ULTRA SOUND GUIDANCE;  Surgeon: Tereso Newcomer, MD;  Location: [facility];  Service: Gynecology;  Laterality: N/A;  . miscarriage    . TOOTH EXTRACTION     Social History   Social History  . Marital status: Married    Spouse name: N/A  . Number of children: N/A  . Years of education: N/A   Occupational History  . Not on file.   Social History Main Topics  . Smoking status: Never Smoker  . Smokeless tobacco: Never Used  . Alcohol use No  . Drug use: Yes    Types: Marijuana     Comment: last use 02/24/2016  .  Sexual activity: Yes   Other Topics Concern  . Not on file   Social History Narrative  . No narrative on file   No current facility-administered medications on file prior to encounter.    Current Outpatient Prescriptions on File Prior to Encounter  Medication Sig Dispense Refill  . ACYCLOVIR PO Take by mouth.    . buprenorphine (SUBUTEX) 8 MG SUBL SL tablet Place 4 mg under the tongue 2 (two) times daily.     . calcium carbonate (TUMS - DOSED IN MG ELEMENTAL CALCIUM) 500 MG chewable tablet Chew 1 tablet by mouth daily as needed for heartburn.      No Known Allergies  I have reviewed patient's Past Medical Hx, Surgical Hx, Family Hx, Social Hx, medications and allergies.   ROS:  Review of Systems  Constitutional: Negative for fever.  Gastrointestinal: Positive for abdominal pain, nausea and vomiting. Negative for constipation and diarrhea.  Genitourinary: Negative for dysuria.  Musculoskeletal: Negative for myalgias.   Review of Systems  Other systems negative   Physical Exam  Physical Exam Patient Vitals for the past 24 hrs:  BP Temp Temp src Pulse Resp Height Weight  10/22/16 1932 - 98.9 F (37.2 C) Oral - - - -  10/22/16 1845 114/60 98.4 F (36.9 C) Oral 69 20 5\' 4"  (1.626 m) 208 lb (94.3 kg)   Constitutional: Well-developed, well-nourished female in no acute distress.  Cardiovascular: normal rate Respiratory:  normal effort GI: Abd soft, mildly tender in LLQ. Pos BS x 4 MS: Extremities nontender, no edema, normal ROM Neurologic: Alert and oriented x 4.  GU: Neg CVAT.  PELVIC EXAM: Cervix pink, visually closed, without lesion, scant white creamy discharge, vaginal walls and external genitalia normal Bimanual exam: Cervix 0/long/high, firm, anterior, neg CMT, uterus nontender, nonenlarged, adnexa without tenderness, enlargement, or mass    LAB RESULTS Results for orders placed or performed during the hospital encounter of 10/22/16 (from the past 24 hour(s))   Urinalysis, Routine w reflex microscopic     Status: Abnormal   Collection Time: 10/22/16  6:37 PM  Result Value Ref Range   Color, Urine YELLOW YELLOW   APPearance HAZY (A) CLEAR   Specific Gravity, Urine 1.021 1.005 - 1.030   pH 5.0 5.0 - 8.0   Glucose, UA NEGATIVE NEGATIVE mg/dL   Hgb urine dipstick NEGATIVE NEGATIVE   Bilirubin Urine NEGATIVE NEGATIVE   Ketones, ur 20 (A) NEGATIVE mg/dL   Protein, ur 30 (A) NEGATIVE mg/dL   Nitrite NEGATIVE NEGATIVE   Leukocytes, UA NEGATIVE NEGATIVE   RBC / HPF 0-5 0 - 5 RBC/hpf   WBC, UA 0-5 0 - 5 WBC/hpf   Bacteria, UA FEW (A) NONE SEEN   Squamous Epithelial / LPF 0-5 (A) NONE SEEN   Mucous PRESENT   Pregnancy, urine POC     Status: Abnormal   Collection Time: 10/22/16  6:41 PM  Result Value Ref Range   Preg Test, Ur POSITIVE (A) NEGATIVE  Wet prep, genital     Status: Abnormal   Collection Time: 10/22/16  7:20 PM  Result Value Ref Range   Yeast Wet Prep HPF POC NONE SEEN NONE SEEN   Trich, Wet Prep NONE SEEN NONE SEEN   Clue Cells Wet Prep HPF POC NONE SEEN NONE SEEN   WBC, Wet Prep HPF POC FEW (A) NONE SEEN   Sperm NONE SEEN   CBC     Status: Abnormal   Collection Time: 10/22/16  7:41 PM  Result Value Ref Range   WBC 11.5 (H) 4.0 - 10.5 K/uL   RBC 4.25 3.87 - 5.11 MIL/uL   Hemoglobin 12.7 12.0 - 15.0 g/dL   HCT 16.1 09.6 - 04.5 %   MCV 86.8 78.0 - 100.0 fL   MCH 29.9 26.0 - 34.0 pg   MCHC 34.4 30.0 - 36.0 g/dL   RDW 40.9 81.1 - 91.4 %   Platelets 172 150 - 400 K/uL  hCG, quantitative, pregnancy     Status: Abnormal   Collection Time: 10/22/16  7:41 PM  Result Value Ref Range   hCG, Beta Chain, Quant, S 24,354 (H) <5 mIU/mL    --/--/O POS (02/06 1901)  IMAGING No results found.  MAU Management/MDM: Ordered usual first trimester r/o ectopic labs.   Pelvic exam and cultures done Will check baseline Ultrasound to rule out ectopic.  Treatments in MAU included IV hydration, Phenergan.  Vomited after Phenergan so Zofran  added.   This bleeding/pain can represent a normal pregnancy with bleeding, spontaneous abortion or even an ectopic which can be life-threatening.  The process as listed above helps to determine which of these is present.  US showed Single gestational sac with Yolk sac.  This effectively rules out ectopic pregnancy Her pain seems to come in waves.  She is very quiet and calm, then will cry out withpain for a few minutes. Suspect this is colicky GI pain, possibly due to GI virus  ASSESSMENT 1. Pelvic pain affecting pregnancy in first trimester, antepartum   2. Pelvic pain affecting pregnancy in first trimester, antepartum     PLAN Discharge home after vomiting improves Plan to repeat Ultrasound in about 7-10 days  RX phenergan PRN   Pt stable at time of discharge. Encouraged to return here or to other Urgent Care/ED if she develops worsening of symptoms, increase in pain, fever, or other concerning symptoms.   Report given to oncoming CNM who will discharge her when more stable  Wynelle BourgeoisMarie Williams CNM, MSN Certified Nurse-Midwife 10/22/2016  7:42 PM

## 2016-10-22 NOTE — Discharge Instructions (Signed)
First Trimester of Pregnancy The first trimester of pregnancy is from week 1 until the end of week 13 (months 1 through 3). A week after a sperm fertilizes an egg, the egg will implant on the wall of the uterus. This embryo will begin to develop into a baby. Genes from you and your partner will form the baby. The female genes will determine whether the baby will be a boy or a girl. At 6-8 weeks, the eyes and face will be formed, and the heartbeat can be seen on ultrasound. At the end of 12 weeks, all the baby's organs will be formed. Now that you are pregnant, you will want to do everything you can to have a healthy baby. Two of the most important things are to get good prenatal care and to follow your health care provider's instructions. Prenatal care is all the medical care you receive before the baby's birth. This care will help prevent, find, and treat any problems during the pregnancy and childbirth. Body changes during your first trimester Your body goes through many changes during pregnancy. The changes vary from woman to woman.  You may gain or lose a couple of pounds at first.  You may feel sick to your stomach (nauseous) and you may throw up (vomit). If the vomiting is uncontrollable, call your health care provider.  You may tire easily.  You may develop headaches that can be relieved by medicines. All medicines should be approved by your health care provider.  You may urinate more often. Painful urination may mean you have a bladder infection.  You may develop heartburn as a result of your pregnancy.  You may develop constipation because certain hormones are causing the muscles that push stool through your intestines to slow down.  You may develop hemorrhoids or swollen veins (varicose veins).  Your breasts may begin to grow larger and become tender. Your nipples may stick out more, and the tissue that surrounds them (areola) may become darker.  Your gums may bleed and may be  sensitive to brushing and flossing.  Dark spots or blotches (chloasma, mask of pregnancy) may develop on your face. This will likely fade after the baby is born.  Your menstrual periods will stop.  You may have a loss of appetite.  You may develop cravings for certain kinds of food.  You may have changes in your emotions from day to day, such as being excited to be pregnant or being concerned that something may go wrong with the pregnancy and baby.  You may have more vivid and strange dreams.  You may have changes in your hair. These can include thickening of your hair, rapid growth, and changes in texture. Some women also have hair loss during or after pregnancy, or hair that feels dry or thin. Your hair will most likely return to normal after your baby is born.  What to expect at prenatal visits During a routine prenatal visit:  You will be weighed to make sure you and the baby are growing normally.  Your blood pressure will be taken.  Your abdomen will be measured to track your baby's growth.  The fetal heartbeat will be listened to between weeks 10 and 14 of your pregnancy.  Test results from any previous visits will be discussed.  Your health care provider may ask you:  How you are feeling.  If you are feeling the baby move.  If you have had any abnormal symptoms, such as leaking fluid, bleeding, severe headaches,   or abdominal cramping.  If you are using any tobacco products, including cigarettes, chewing tobacco, and electronic cigarettes.  If you have any questions.  Other tests that may be performed during your first trimester include:  Blood tests to find your blood type and to check for the presence of any previous infections. The tests will also be used to check for low iron levels (anemia) and protein on red blood cells (Rh antibodies). Depending on your risk factors, or if you previously had diabetes during pregnancy, you may have tests to check for high blood  sugar that affects pregnant women (gestational diabetes).  Urine tests to check for infections, diabetes, or protein in the urine.  An ultrasound to confirm the proper growth and development of the baby.  Fetal screens for spinal cord problems (spina bifida) and Down syndrome.  HIV (human immunodeficiency virus) testing. Routine prenatal testing includes screening for HIV, unless you choose not to have this test.  You may need other tests to make sure you and the baby are doing well.  Follow these instructions at home: Medicines  Follow your health care provider's instructions regarding medicine use. Specific medicines may be either safe or unsafe to take during pregnancy.  Take a prenatal vitamin that contains at least 600 micrograms (mcg) of folic acid.  If you develop constipation, try taking a stool softener if your health care provider approves. Eating and drinking  Eat a balanced diet that includes fresh fruits and vegetables, whole grains, good sources of protein such as meat, eggs, or tofu, and low-fat dairy. Your health care provider will help you determine the amount of weight gain that is right for you.  Avoid raw meat and uncooked cheese. These carry germs that can cause birth defects in the baby.  Eating four or five small meals rather than three large meals a day may help relieve nausea and vomiting. If you start to feel nauseous, eating a few soda crackers can be helpful. Drinking liquids between meals, instead of during meals, also seems to help ease nausea and vomiting.  Limit foods that are high in fat and processed sugars, such as fried and sweet foods.  To prevent constipation: ? Eat foods that are high in fiber, such as fresh fruits and vegetables, whole grains, and beans. ? Drink enough fluid to keep your urine clear or pale yellow. Activity  Exercise only as directed by your health care provider. Most women can continue their usual exercise routine during  pregnancy. Try to exercise for 30 minutes at least 5 days a week. Exercising will help you: ? Control your weight. ? Stay in shape. ? Be prepared for labor and delivery.  Experiencing pain or cramping in the lower abdomen or lower back is a good sign that you should stop exercising. Check with your health care provider before continuing with normal exercises.  Try to avoid standing for long periods of time. Move your legs often if you must stand in one place for a long time.  Avoid heavy lifting.  Wear low-heeled shoes and practice good posture.  You may continue to have sex unless your health care provider tells you not to. Relieving pain and discomfort  Wear a good support bra to relieve breast tenderness.  Take warm sitz baths to soothe any pain or discomfort caused by hemorrhoids. Use hemorrhoid cream if your health care provider approves.  Rest with your legs elevated if you have leg cramps or low back pain.  If you develop   varicose veins in your legs, wear support hose. Elevate your feet for 15 minutes, 3-4 times a day. Limit salt in your diet. Prenatal care  Schedule your prenatal visits by the twelfth week of pregnancy. They are usually scheduled monthly at first, then more often in the last 2 months before delivery.  Write down your questions. Take them to your prenatal visits.  Keep all your prenatal visits as told by your health care provider. This is important. Safety  Wear your seat belt at all times when driving.  Make a list of emergency phone numbers, including numbers for family, friends, the hospital, and police and fire departments. General instructions  Ask your health care provider for a referral to a local prenatal education class. Begin classes no later than the beginning of month 6 of your pregnancy.  Ask for help if you have counseling or nutritional needs during pregnancy. Your health care provider can offer advice or refer you to specialists for help  with various needs.  Do not use hot tubs, steam rooms, or saunas.  Do not douche or use tampons or scented sanitary pads.  Do not cross your legs for long periods of time.  Avoid cat litter boxes and soil used by cats. These carry germs that can cause birth defects in the baby and possibly loss of the fetus by miscarriage or stillbirth.  Avoid all smoking, herbs, alcohol, and medicines not prescribed by your health care provider. Chemicals in these products affect the formation and growth of the baby.  Do not use any products that contain nicotine or tobacco, such as cigarettes and e-cigarettes. If you need help quitting, ask your health care provider. You may receive counseling support and other resources to help you quit.  Schedule a dentist appointment. At home, brush your teeth with a soft toothbrush and be gentle when you floss. Contact a health care provider if:  You have dizziness.  You have mild pelvic cramps, pelvic pressure, or nagging pain in the abdominal area.  You have persistent nausea, vomiting, or diarrhea.  You have a bad smelling vaginal discharge.  You have pain when you urinate.  You notice increased swelling in your face, hands, legs, or ankles.  You are exposed to fifth disease or chickenpox.  You are exposed to German measles (rubella) and have never had it. Get help right away if:  You have a fever.  You are leaking fluid from your vagina.  You have spotting or bleeding from your vagina.  You have severe abdominal cramping or pain.  You have rapid weight gain or loss.  You vomit blood or material that looks like coffee grounds.  You develop a severe headache.  You have shortness of breath.  You have any kind of trauma, such as from a fall or a car accident. Summary  The first trimester of pregnancy is from week 1 until the end of week 13 (months 1 through 3).  Your body goes through many changes during pregnancy. The changes vary from  woman to woman.  You will have routine prenatal visits. During those visits, your health care provider will examine you, discuss any test results you may have, and talk with you about how you are feeling. This information is not intended to replace advice given to you by your health care provider. Make sure you discuss any questions you have with your health care provider. Document Released: 03/19/2001 Document Revised: 03/06/2016 Document Reviewed: 03/06/2016 Elsevier Interactive Patient Education  2017 [employer]  

## 2016-10-22 NOTE — MAU Note (Signed)
Urine in the lab  

## 2016-10-23 LAB — GC/CHLAMYDIA PROBE AMP (~~LOC~~) NOT AT ARMC
Chlamydia: NEGATIVE
Neisseria Gonorrhea: NEGATIVE

## 2016-10-26 ENCOUNTER — Inpatient Hospital Stay (HOSPITAL_COMMUNITY)
Admission: AD | Admit: 2016-10-26 | Discharge: 2016-10-26 | Disposition: A | Discharge status: Home / Self Care | Payer: Medicaid Other | Source: Ambulatory Visit | Attending: Obstetrics and Gynecology | Admitting: Obstetrics and Gynecology

## 2016-10-26 DIAGNOSIS — O219 Vomiting of pregnancy, unspecified: Secondary | ICD-10-CM | POA: Diagnosis not present

## 2016-10-26 DIAGNOSIS — O210 Mild hyperemesis gravidarum: Secondary | ICD-10-CM | POA: Diagnosis present

## 2016-10-26 DIAGNOSIS — R109 Unspecified abdominal pain: Secondary | ICD-10-CM | POA: Insufficient documentation

## 2016-10-26 DIAGNOSIS — O26891 Other specified pregnancy related conditions, first trimester: Secondary | ICD-10-CM

## 2016-10-26 LAB — COMPREHENSIVE METABOLIC PANEL
ALT: 72 U/L — ABNORMAL HIGH (ref 14–54)
AST: 41 U/L (ref 15–41)
Albumin: 4.4 g/dL (ref 3.5–5.0)
Alkaline Phosphatase: 56 U/L (ref 38–126)
Anion gap: 9 (ref 5–15)
BUN: 12 mg/dL (ref 6–20)
CO2: 25 mmol/L (ref 22–32)
Calcium: 9.3 mg/dL (ref 8.9–10.3)
Chloride: 102 mmol/L (ref 101–111)
Creatinine, Ser: 0.77 mg/dL (ref 0.44–1.00)
GFR calc Af Amer: >60 mL/min (ref >60)
GFR calc non Af Amer: >60 mL/min (ref >60)
Glucose, Bld: 95 mg/dL (ref 65–99)
Potassium: 3.7 mmol/L (ref 3.5–5.1)
Sodium: 136 mmol/L (ref 135–145)
Total Bilirubin: 0.6 mg/dL (ref 0.3–1.2)
Total Protein: 7.9 g/dL (ref 6.5–8.1)

## 2016-10-26 LAB — URINALYSIS, ROUTINE W REFLEX MICROSCOPIC
Bilirubin Urine: NEGATIVE
GLUCOSE, UA: NEGATIVE mg/dL
Ketones, ur: 20 mg/dL — AB
Leukocytes, UA: NEGATIVE
Nitrite: NEGATIVE
PH: 5 (ref 5.0–8.0)
Protein, ur: NEGATIVE mg/dL
Specific Gravity, Urine: 1.019 (ref 1.005–1.030)

## 2016-10-26 LAB — CBC
HCT: 37.1 % (ref 36.0–46.0)
Hemoglobin: 13.2 g/dL (ref 12.0–15.0)
MCH: 30.3 pg (ref 26.0–34.0)
MCHC: 35.6 g/dL (ref 30.0–36.0)
MCV: 85.3 fL (ref 78.0–100.0)
Platelets: 158 10*3/uL (ref 150–400)
RBC: 4.35 MIL/uL (ref 3.87–5.11)
RDW: 13.5 % (ref 11.5–15.5)
WBC: 12.5 10*3/uL — ABNORMAL HIGH (ref 4.0–10.5)

## 2016-10-26 LAB — RAPID URINE DRUG SCREEN, HOSP PERFORMED
Amphetamines: NOT DETECTED
BENZODIAZEPINES: NOT DETECTED
Barbiturates: NOT DETECTED
COCAINE: NOT DETECTED
Opiates: POSITIVE — AB
Tetrahydrocannabinol: POSITIVE — AB

## 2016-10-26 MED ORDER — ONDANSETRON 4 MG PO TBDP: 4.0000 mg | ORAL_TABLET | Freq: Three times a day (TID) | ORAL | 1 refills | Status: DC | PRN

## 2016-10-26 MED ORDER — DEXTROSE IN LACTATED RINGERS 5 % IV SOLN
Freq: Once | INTRAVENOUS | Status: AC
  Administered 2016-10-26: 09:00:00 via INTRAVENOUS

## 2016-10-26 MED ORDER — CYCLOBENZAPRINE HCL 10 MG PO TABS
10.0000 mg | ORAL_TABLET | Freq: Once | ORAL | Status: AC
  Administered 2016-10-26: 10 mg via ORAL
  Filled 2016-10-26: qty 1

## 2016-10-26 MED ORDER — ONDANSETRON 8 MG PO TBDP
8.0000 mg | ORAL_TABLET | Freq: Once | ORAL | Status: AC
  Administered 2016-10-26: 8 mg via ORAL
  Filled 2016-10-26: qty 1

## 2016-10-26 MED ORDER — PROMETHAZINE HCL 25 MG/ML IJ SOLN
25.0000 mg | Freq: Once | INTRAMUSCULAR | Status: AC
  Administered 2016-10-26: 25 mg via INTRAMUSCULAR
  Filled 2016-10-26: qty 1

## 2016-10-26 NOTE — MAU Provider Note (Signed)
History   G3P0110 @ 7.1 wks in with nausea and vomiting of pregnancy. Also c/o abd pain and vaginal bleeding. States phenergan does not help.  CSN: [id number]  Arrival date & time 10/26/16  7829   First Provider Initiated Contact with Patient 10/26/16 [phone]      Chief Complaint  Patient presents with  . Abdominal Pain  . Morning Sickness    HPI  Past Medical History:  Diagnosis Date  . Anxiety   . Depression   . Headache     Past Surgical History:  Procedure Laterality Date  . DILATION AND EVACUATION N/A 05/14/2016   Procedure: DILATATION AND EVACUATION (D&E) 2ND TRIMESTER WITH ULTRA SOUND GUIDANCE;  Surgeon: Tereso Newcomer, MD;  Location: [facility];  Service: Gynecology;  Laterality: N/A;  . miscarriage    . TOOTH EXTRACTION      Family History  Problem Relation Age of Onset  . Diabetes Mother   . Hypertension Mother   . Heart disease Maternal Grandfather     Social History  Substance Use Topics  . Smoking status: Never Smoker  . Smokeless tobacco: Never Used  . Alcohol use No    OB History    Gravida Para Term Preterm AB Living   3 1   1 1      SAB TAB Ectopic Multiple Live Births   1              Review of Systems  Constitutional: Negative.   HENT: Negative.   Eyes: Negative.   Respiratory: Negative.   Cardiovascular: Negative.   Gastrointestinal: Positive for abdominal pain, nausea and vomiting.  Endocrine: Negative.   Genitourinary: Positive for vaginal bleeding.  Musculoskeletal: Negative.   Skin: Negative.   Allergic/Immunologic: Negative.   Neurological: Negative.   Hematological: Negative.   Psychiatric/Behavioral: Negative.     Allergies  Patient has no known allergies.  Home Medications    BP 128/79 (BP Location: Right Arm)   Pulse 67   Temp 98 F (36.7 C)   Resp 18   Ht 5\' 4"  (1.626 m)   Wt 245 lb (111.1 kg)   LMP 09/06/2016   BMI 42.05 kg/m   Physical Exam  Constitutional: She is oriented to person, place, and time. She  appears well-developed and well-nourished.  HENT:  Head: Normocephalic.  Eyes: Pupils are equal, round, and reactive to light.  Neck: Normal range of motion.  Cardiovascular: Normal rate, regular rhythm, normal heart sounds and intact distal pulses.   Pulmonary/Chest: Effort normal and breath sounds normal.  Abdominal: Soft. Bowel sounds are normal.  Genitourinary: Vagina normal and uterus normal.  Musculoskeletal: Normal range of motion.  Neurological: She is alert and oriented to person, place, and time. She has normal reflexes.  Skin: Skin is warm and dry.  Psychiatric: She has a normal mood and affect. Her behavior is normal. Judgment and thought content normal.    MAU Course  Procedures (including critical care time)  Labs Reviewed  URINALYSIS, ROUTINE W REFLEX MICROSCOPIC - Abnormal; Notable for the following:       Result Value   Hgb urine dipstick SMALL (*)    Ketones, ur 20 (*)    Bacteria, UA RARE (*)    Squamous Epithelial / LPF 0-5 (*)    All other components within normal limits  CBC - Abnormal; Notable for the following:    WBC 12.5 (*)    All other components within normal limits  COMPREHENSIVE METABOLIC PANEL - Abnormal;  Notable for the following:    ALT 72 (*)    All other components within normal limits   No results found.   1. Nausea and vomiting of pregnancy, antepartum   2. Abdominal pain in pregnancy, first trimester       MDM  Absolutely no vaginal bleeding with exam. VSS. LCTAB. Heart RRR. Will IV hydrate and try SL zofran if retains fluids will d/c home. Taking po's well will d/c home

## 2016-10-26 NOTE — MAU Note (Signed)
Unable to keep down anything since Tues when was seen MAU. Given flds and meds but meds at home not help. Some pink to brown spotting at times. Having abd pain.

## 2016-10-26 NOTE — Discharge Instructions (Signed)
Abdominal Pain During Pregnancy Abdominal pain is common in pregnancy. Most of the time, it does not cause harm. There are many causes of abdominal pain. Some causes are more serious than others and sometimes the cause is not known. Abdominal pain can be a sign that something is very wrong with the pregnancy or the pain may have nothing to do with the pregnancy. Always tell your health care provider if you have any abdominal pain. Follow these instructions at home:  Do not have sex or put anything in your vagina until your symptoms go away completely.  Watch your abdominal pain for any changes.  Get plenty of rest until your pain improves.  Drink enough fluid to keep your urine clear or pale yellow.  Take over-the-counter or prescription medicines only as told by your health care provider.  Keep all follow-up visits as told by your health care provider. This is important. Contact a health care provider if:  You have a fever.  Your pain gets worse or you have cramping.  Your pain continues after resting. Get help right away if:  You are bleeding, leaking fluid, or passing tissue from the vagina.  You have vomiting or diarrhea that does not go away.  You have painful or bloody urination.  You notice a decrease in your baby's movements.  You feel very weak or faint.  You have shortness of breath.  You develop a severe headache with abdominal pain.  You have abnormal vaginal discharge with abdominal pain. This information is not intended to replace advice given to you by your health care provider. Make sure you discuss any questions you have with your health care provider. Document Released: 03/25/2005 Document Revised: 01/04/2016 Document Reviewed: 10/22/2012 Elsevier Interactive Patient Education  2018 [employer]  Morning Sickness Morning sickness is when you feel sick to your stomach (nauseous) during pregnancy. You may feel sick to your stomach and throw up (vomit).  You may feel sick in the morning, but you can feel this way any time of day. Some women feel very sick to their stomach and cannot stop throwing up (hyperemesis gravidarum). Follow these instructions at home:  Only take medicines as told by your doctor.  Take multivitamins as told by your doctor. Taking multivitamins before getting pregnant can stop or lessen the harshness of morning sickness.  Eat dry toast or unsalted crackers before getting out of bed.  Eat 5 to 6 small meals a day.  Eat dry and bland foods like rice and baked potatoes.  Do not drink liquids with meals. Drink between meals.  Do not eat greasy, fatty, or spicy foods.  Have someone cook for you if the smell of food causes you to feel sick or throw up.  If you feel sick to your stomach after taking prenatal vitamins, take them at night or with a snack.  Eat protein when you need a snack (nuts, yogurt, cheese).  Eat unsweetened gelatins for dessert.  Wear a bracelet used for sea sickness (acupressure wristband).  Go to a doctor that puts thin needles into certain body points (acupuncture) to improve how you feel.  Do not smoke.  Use a humidifier to keep the air in your house free of odors.  Get lots of fresh air. Contact a doctor if:  You need medicine to feel better.  You feel dizzy or lightheaded.  You are losing weight. Get help right away if:  You feel very sick to your stomach and cannot stop throwing up.  You pass out (faint). This information is not intended to replace advice given to you by your health care provider. Make sure you discuss any questions you have with your health care provider. Document Released: 05/02/2004 Document Revised: 08/31/2015 Document Reviewed: 09/09/2012 Elsevier Interactive Patient Education  2017 [employer]

## 2016-11-13 LAB — OB RESULTS CONSOLE GC/CHLAMYDIA
CHLAMYDIA, DNA PROBE: NEGATIVE
Gonorrhea: NEGATIVE

## 2016-11-13 LAB — OB RESULTS CONSOLE ABO/RH: RH Type: POSITIVE

## 2016-11-13 LAB — OB RESULTS CONSOLE HEPATITIS B SURFACE ANTIGEN: HEP B S AG: NEGATIVE

## 2016-11-13 LAB — OB RESULTS CONSOLE RUBELLA ANTIBODY, IGM: Rubella: IMMUNE

## 2016-11-13 LAB — OB RESULTS CONSOLE RPR: RPR: NONREACTIVE

## 2016-11-13 LAB — OB RESULTS CONSOLE ANTIBODY SCREEN: ANTIBODY SCREEN: NEGATIVE

## 2016-11-13 LAB — OB RESULTS CONSOLE HIV ANTIBODY (ROUTINE TESTING): HIV: NONREACTIVE

## 2016-11-20 ENCOUNTER — Other Ambulatory Visit (HOSPITAL_COMMUNITY)
Admission: RE | Admit: 2016-11-20 | Discharge: 2016-11-20 | Disposition: A | Discharge status: Home / Self Care | Payer: 59 | Source: Ambulatory Visit | Attending: Obstetrics and Gynecology | Admitting: Obstetrics and Gynecology

## 2016-11-20 ENCOUNTER — Other Ambulatory Visit: Payer: Self-pay | Admitting: Obstetrics and Gynecology

## 2016-11-20 DIAGNOSIS — Z01411 Encounter for gynecological examination (general) (routine) with abnormal findings: Secondary | ICD-10-CM | POA: Insufficient documentation

## 2016-11-22 LAB — CYTOLOGY - PAP: DIAGNOSIS: NEGATIVE

## 2016-12-04 ENCOUNTER — Encounter (HOSPITAL_COMMUNITY): Payer: Self-pay | Admitting: *Deleted

## 2016-12-04 ENCOUNTER — Inpatient Hospital Stay (HOSPITAL_COMMUNITY)
Admission: AD | Admit: 2016-12-04 | Discharge: 2016-12-04 | Disposition: A | Discharge status: Home / Self Care | Payer: 59 | Source: Ambulatory Visit | Attending: Obstetrics and Gynecology | Admitting: Obstetrics and Gynecology

## 2016-12-04 DIAGNOSIS — O219 Vomiting of pregnancy, unspecified: Secondary | ICD-10-CM | POA: Diagnosis not present

## 2016-12-04 DIAGNOSIS — R109 Unspecified abdominal pain: Secondary | ICD-10-CM | POA: Insufficient documentation

## 2016-12-04 DIAGNOSIS — O26891 Other specified pregnancy related conditions, first trimester: Secondary | ICD-10-CM | POA: Diagnosis not present

## 2016-12-04 DIAGNOSIS — R112 Nausea with vomiting, unspecified: Secondary | ICD-10-CM | POA: Diagnosis not present

## 2016-12-04 DIAGNOSIS — Z3A12 12 weeks gestation of pregnancy: Secondary | ICD-10-CM | POA: Insufficient documentation

## 2016-12-04 HISTORY — DX: Methicillin resistant Staphylococcus aureus infection, unspecified site: A49.02

## 2016-12-04 HISTORY — DX: Calculus of kidney: N20.0

## 2016-12-04 HISTORY — DX: Unspecified ovarian cyst, unspecified side: N83.209

## 2016-12-04 LAB — URINALYSIS, ROUTINE W REFLEX MICROSCOPIC
BILIRUBIN URINE: NEGATIVE
Bacteria, UA: NONE SEEN
GLUCOSE, UA: NEGATIVE mg/dL
HGB URINE DIPSTICK: NEGATIVE
Ketones, ur: 20 mg/dL — AB
LEUKOCYTES UA: NEGATIVE
NITRITE: NEGATIVE
PH: 5 (ref 5.0–8.0)
PROTEIN: 30 mg/dL — AB
SPECIFIC GRAVITY, URINE: 1.018 (ref 1.005–1.030)

## 2016-12-04 LAB — RAPID URINE DRUG SCREEN, HOSP PERFORMED
AMPHETAMINES: NOT DETECTED
BENZODIAZEPINES: NOT DETECTED
Barbiturates: NOT DETECTED
COCAINE: NOT DETECTED
OPIATES: POSITIVE — AB
TETRAHYDROCANNABINOL: POSITIVE — AB

## 2016-12-04 MED ORDER — LACTATED RINGERS IV SOLN
Freq: Once | INTRAVENOUS | Status: AC
  Administered 2016-12-04: 19:00:00 via INTRAVENOUS

## 2016-12-04 MED ORDER — PROMETHAZINE HCL 25 MG/ML IJ SOLN
25.0000 mg | Freq: Once | INTRAMUSCULAR | Status: AC
  Administered 2016-12-04: 25 mg via INTRAVENOUS
  Filled 2016-12-04: qty 1

## 2016-12-04 MED ORDER — SODIUM CHLORIDE 0.9 % IV SOLN
8.0000 mg | Freq: Once | INTRAVENOUS | Status: AC
  Administered 2016-12-04: 8 mg via INTRAVENOUS
  Filled 2016-12-04: qty 4

## 2016-12-04 MED ORDER — M.V.I. ADULT IV INJ
Freq: Once | INTRAVENOUS | Status: AC
  Administered 2016-12-04: 20:00:00 via INTRAVENOUS
  Filled 2016-12-04: qty 1000

## 2016-12-04 NOTE — Discharge Instructions (Signed)
Eating Plan for Hyperemesis Gravidarum °Hyperemesis gravidarum is a severe form of morning sickness. Because this condition causes severe nausea and vomiting, it can lead to dehydration, malnutrition, and weight loss. One way to lessen the symptoms of nausea and vomiting is to follow the eating plan for hyperemesis gravidarum. It is often used along with prescribed medicines to control your symptoms. °What can I do to relieve my symptoms? °Listen to your body. Everyone is different and has different preferences. Find what works best for you. Take any of the following actions that are helpful to you: °· Eat and drink slowly. °· Eat 5-6 small meals daily instead of 3 large meals. °· Eat crackers before you get out of bed in the morning. °· Try having a snack in the middle of the night. °· Starchy foods are usually tolerated well. Examples include cereal, toast, bread, potatoes, pasta, rice, and pretzels. °· Ginger may help with nausea. Add ¼ tsp ground ginger to hot tea or choose ginger tea. °· Try drinking 100% fruit juice or an electrolyte drink. An electrolyte drink contains sodium, potassium, and chloride. °· Continue to take your prenatal vitamins as told by your health care provider. If you are having trouble taking your prenatal vitamins, talk with your health care provider about different options. °· Include at least 1 serving of protein with your meals and snacks. Protein options include meats or poultry, beans, nuts, eggs, and yogurt. Try eating a protein-rich snack before bed. Examples of these snacks include cheese and crackers or half of a peanut butter or turkey sandwich. °· Consider eliminating foods that trigger your symptoms. These may include spicy foods, coffee, high-fat foods, very sweet foods, and acidic foods. °· Try meals that have more protein combined with bland, salty, lower-fat, and dry foods, such as nuts, seeds, pretzels, crackers, and cereal. °· Talk with your healthcare provider about  starting a supplement of vitamin B6. °· Have fluids that are cold, clear, and carbonated or sour. Examples include lemonade, ginger ale, lemon-lime soda, ice water, and sparkling water. °· Try lemon or mint tea. °· Try brushing your teeth or using a mouth rinse after meals. ° °What should I avoid to reduce my symptoms? °Avoiding some of the following things may help reduce your symptoms. °· Foods with strong smells. Try eating meals in well-ventilated areas that are free of odors. °· Drinking water or other beverages with meals. Try not to drink anything during the 30 minutes before and after your meals. °· Drinking more than 1 cup of fluid at a time. Sometimes using a straw helps. °· Fried or high-fat foods, such as butter and cream sauces. °· Spicy foods. °· Skipping meals as best as you can. Nausea can be more intense on an empty stomach. If you cannot tolerate food at that time, do not force it. Try sucking on ice chips or other frozen items, and make up for missed calories later. °· Lying down within 2 hours after eating. °· Environmental triggers. These may include smoky rooms, closed spaces, rooms with strong smells, warm or humid places, overly loud and noisy rooms, and rooms with motion or flickering lights. °· Quick and sudden changes in your movement. ° °This information is not intended to replace advice given to you by your health care provider. Make sure you discuss any questions you have with your health care provider. °Document Released: 01/20/2007 Document Revised: 11/22/2015 Document Reviewed: 10/24/2015 °Elsevier Interactive Patient Education © 2018 Elsevier Inc. ° °

## 2016-12-04 NOTE — MAU Note (Signed)
Just doesn't feel good, hurts all over from all the throwing up.  Hasn't kept anything down since Monday.  Sent over from Dr's office.  Was one day without meds.

## 2016-12-04 NOTE — MAU Provider Note (Signed)
History     CSN: [id number]  Arrival date and time: 12/04/16 1721   First Provider Initiated Contact with Patient 12/04/16 1811      Chief Complaint  Patient presents with  . Emesis  . Abdominal Pain   HPI   Ms.Abigail Fox is a  31 y.o. female G3P0110 @ [redacted]w[redacted]d here in MAU with nausea and vomiting. States she has lost 14 lbs in this pregnancy. States she has vomited about 20 times. She is taking Zofran at home. Says the nausea and vomiting improved and she was eating a regular diet.  Ran out of her diclegis.   On subutex "because I had issues in the past".   OB History    Gravida Para Term Preterm AB Living   3 1   1 1      SAB TAB Ectopic Multiple Live Births   1              Past Medical History:  Diagnosis Date  . Anxiety   . Depression   . Headache   . Kidney stones   . MRSA infection 2010,2013  . Ovarian cyst     Past Surgical History:  Procedure Laterality Date  . DILATION AND EVACUATION N/A 05/14/2016   Procedure: DILATATION AND EVACUATION (D&E) 2ND TRIMESTER WITH ULTRA SOUND GUIDANCE;  Surgeon: Tereso Newcomer, MD;  Location: [facility];  Service: Gynecology;  Laterality: N/A;  . miscarriage    . TOOTH EXTRACTION      Family History  Problem Relation Age of Onset  . Diabetes Mother   . Hypertension Mother   . Heart disease Maternal Grandfather     Social History  Substance Use Topics  . Smoking status: Never Smoker  . Smokeless tobacco: Never Used  . Alcohol use No    Allergies: No Known Allergies  Prescriptions Prior to Admission  Medication Sig Dispense Refill Last Dose  . buprenorphine (SUBUTEX) 8 MG SUBL SL tablet Place 8 mg under the tongue 2 (two) times daily.  0 10/26/2016 at Unknown time  . calcium carbonate (TUMS - DOSED IN MG ELEMENTAL CALCIUM) 500 MG chewable tablet Chew 2 tablets by mouth 4 (four) times daily as needed for indigestion or heartburn.    Past Week at Unknown time  . ondansetron (ZOFRAN ODT) 4 MG disintegrating tablet Take  1 tablet (4 mg total) by mouth every 8 (eight) hours as needed for nausea or vomiting. 20 tablet 1   . valACYclovir (VALTREX) 500 MG tablet Take 500 mg by mouth daily.    10/25/2016 at Unknown time   Results for orders placed or performed during the hospital encounter of 12/04/16 (from the past 48 hour(s))  Urinalysis, Routine w reflex microscopic     Status: Abnormal   Collection Time: 12/04/16  5:30 PM  Result Value Ref Range   Color, Urine YELLOW YELLOW   APPearance HAZY (A) CLEAR   Specific Gravity, Urine 1.018 1.005 - 1.030   pH 5.0 5.0 - 8.0   Glucose, UA NEGATIVE NEGATIVE mg/dL   Hgb urine dipstick NEGATIVE NEGATIVE   Bilirubin Urine NEGATIVE NEGATIVE   Ketones, ur 20 (A) NEGATIVE mg/dL   Protein, ur 30 (A) NEGATIVE mg/dL   Nitrite NEGATIVE NEGATIVE   Leukocytes, UA NEGATIVE NEGATIVE   RBC / HPF 0-5 0 - 5 RBC/hpf   WBC, UA 0-5 0 - 5 WBC/hpf   Bacteria, UA NONE SEEN NONE SEEN   Squamous Epithelial / LPF 0-5 (A) NONE SEEN  Mucus PRESENT    Hyaline Casts, UA PRESENT    Review of Systems  Gastrointestinal: Positive for nausea and vomiting.   Physical Exam   Blood pressure 118/64, pulse 73, temperature 98.3 F (36.8 C), temperature source Oral, resp. rate 20, weight 240 lb 4 oz (109 kg), last menstrual period 09/06/2016, unknown if currently breastfeeding.  Physical Exam  Constitutional: She is oriented to person, place, and time. She appears well-developed and well-nourished. No distress.  HENT:  Head: Normocephalic.  Eyes: Pupils are equal, round, and reactive to light.  Musculoskeletal: Normal range of motion.  Neurological: She is alert and oriented to person, place, and time.  Skin: Skin is warm. She is not diaphoretic.  Psychiatric: Her behavior is normal.   MAU Course  Procedures  Results for orders placed or performed during the hospital encounter of 12/04/16 (from the past 24 hour(s))  Urinalysis, Routine w reflex microscopic     Status: Abnormal   Collection  Time: 12/04/16  5:30 PM  Result Value Ref Range   Color, Urine YELLOW YELLOW   APPearance HAZY (A) CLEAR   Specific Gravity, Urine 1.018 1.005 - 1.030   pH 5.0 5.0 - 8.0   Glucose, UA NEGATIVE NEGATIVE mg/dL   Hgb urine dipstick NEGATIVE NEGATIVE   Bilirubin Urine NEGATIVE NEGATIVE   Ketones, ur 20 (A) NEGATIVE mg/dL   Protein, ur 30 (A) NEGATIVE mg/dL   Nitrite NEGATIVE NEGATIVE   Leukocytes, UA NEGATIVE NEGATIVE   RBC / HPF 0-5 0 - 5 RBC/hpf   WBC, UA 0-5 0 - 5 WBC/hpf   Bacteria, UA NONE SEEN NONE SEEN   Squamous Epithelial / LPF 0-5 (A) NONE SEEN   Mucus PRESENT    Hyaline Casts, UA PRESENT   Urine rapid drug screen (hosp performed)     Status: Abnormal   Collection Time: 12/04/16  5:30 PM  Result Value Ref Range   Opiates POSITIVE (A) NONE DETECTED   Cocaine NONE DETECTED NONE DETECTED   Benzodiazepines NONE DETECTED NONE DETECTED   Amphetamines NONE DETECTED NONE DETECTED   Tetrahydrocannabinol POSITIVE (A) NONE DETECTED   Barbiturates NONE DETECTED NONE DETECTED     MDM  + fetal heart tones via doppler  LR bolus x1 MVI X 1 Patient tolerating po fluids and crackers at this time.  Zofran 8 mg IV Phenergan 25 mg IV  Report given to Judeth Horn NP who resumes care of the patient.   Duane Lope, NP 12/04/2016 8:10 PM  Patient able to tolerate POs & no longer vomiting. Spoke with Dr. Dion Body; ok to discharge home. Patient was given samples of bonjesta & diclegis today, has a refill of zofran.  Assessment and Plan  A: 1. Nausea and vomiting during pregnancy prior to [redacted] weeks gestation    P: Discharge home Continue meds as prescribed Discussed reasons to return to MAU Keep f/u with OB  Judeth Horn, NP

## 2016-12-10 ENCOUNTER — Other Ambulatory Visit: Payer: Self-pay | Admitting: Obstetrics and Gynecology

## 2016-12-19 ENCOUNTER — Encounter (HOSPITAL_COMMUNITY): Payer: Self-pay

## 2016-12-20 ENCOUNTER — Ambulatory Visit (HOSPITAL_COMMUNITY)
Admission: RE | Admit: 2016-12-20 | Discharge: 2016-12-20 | Disposition: A | Discharge status: Home / Self Care | Payer: 59 | Source: Ambulatory Visit | Attending: Obstetrics and Gynecology | Admitting: Obstetrics and Gynecology

## 2016-12-20 ENCOUNTER — Encounter (HOSPITAL_COMMUNITY): Payer: Self-pay

## 2016-12-20 VITALS — BP 130/77 | HR 85 | Wt 246.1 lb

## 2016-12-20 DIAGNOSIS — O3482 Maternal care for other abnormalities of pelvic organs, second trimester: Secondary | ICD-10-CM | POA: Diagnosis not present

## 2016-12-20 DIAGNOSIS — B182 Chronic viral hepatitis C: Secondary | ICD-10-CM | POA: Diagnosis not present

## 2016-12-20 DIAGNOSIS — O210 Mild hyperemesis gravidarum: Secondary | ICD-10-CM | POA: Insufficient documentation

## 2016-12-20 DIAGNOSIS — Z87442 Personal history of urinary calculi: Secondary | ICD-10-CM | POA: Insufficient documentation

## 2016-12-20 DIAGNOSIS — O98419 Viral hepatitis complicating pregnancy, unspecified trimester: Secondary | ICD-10-CM

## 2016-12-20 DIAGNOSIS — O98412 Viral hepatitis complicating pregnancy, second trimester: Secondary | ICD-10-CM | POA: Diagnosis not present

## 2016-12-20 DIAGNOSIS — Z9889 Other specified postprocedural states: Secondary | ICD-10-CM | POA: Diagnosis not present

## 2016-12-20 DIAGNOSIS — N83209 Unspecified ovarian cyst, unspecified side: Secondary | ICD-10-CM | POA: Insufficient documentation

## 2016-12-20 DIAGNOSIS — F419 Anxiety disorder, unspecified: Secondary | ICD-10-CM | POA: Insufficient documentation

## 2016-12-20 DIAGNOSIS — F329 Major depressive disorder, single episode, unspecified: Secondary | ICD-10-CM | POA: Insufficient documentation

## 2016-12-20 DIAGNOSIS — N2889 Other specified disorders of kidney and ureter: Secondary | ICD-10-CM | POA: Diagnosis not present

## 2016-12-20 DIAGNOSIS — O99342 Other mental disorders complicating pregnancy, second trimester: Secondary | ICD-10-CM | POA: Diagnosis not present

## 2016-12-20 DIAGNOSIS — O26832 Pregnancy related renal disease, second trimester: Secondary | ICD-10-CM | POA: Insufficient documentation

## 2016-12-20 DIAGNOSIS — F1120 Opioid dependence, uncomplicated: Secondary | ICD-10-CM

## 2016-12-20 DIAGNOSIS — O99322 Drug use complicating pregnancy, second trimester: Secondary | ICD-10-CM

## 2016-12-20 DIAGNOSIS — Z3A15 15 weeks gestation of pregnancy: Secondary | ICD-10-CM

## 2016-12-20 NOTE — Progress Notes (Signed)
Maternal Fetal Medicine Consultation  Requesting Provider(s): [facility]  Primary OBDion Body   Reason for consultation: Buprenorphine maintenance and chronic hepatitis C  HPI: 31yo P0020 at 15+0 weeks with chronic hepatits C. She has a viral load of 141,000 and slightly elevated AST and ALT. She is on /day of buprenorphine for opiate addiction maintenance. She is reporting no withdrawal symptoms on that regimen. She has had significant hyperemesis during the first part of the pregnancy   OB History: OB History    Gravida Para Term Preterm AB Living   3 0   0 2 0   SAB TAB Ectopic Multiple Live Births   1              PMH:  Past Medical History:  Diagnosis Date  . Anxiety   . Depression   . Headache   . Kidney stones   . MRSA infection 2010,2013  . Ovarian cyst     PSH:  Past Surgical History:  Procedure Laterality Date  . DILATION AND EVACUATION N/A 05/14/2016   Procedure: DILATATION AND EVACUATION (D&E) 2ND TRIMESTER WITH ULTRA SOUND GUIDANCE;  Surgeon: Tereso Newcomer, MD;  Location: [facility];  Service: Gynecology;  Laterality: N/A;  . miscarriage    . TOOTH EXTRACTION     Meds: See EPIC Section Allergies: NKDA FH: See EPIC Section Soc: See EPIC Section  Review of Systems: no vaginal bleeding or cramping/contractions, no LOF, no nausea/vomiting. All other systems reviewed and are negative.  PE:  VS: See EPIC Section GEN: well-appearing female ABD: gravid, NT  A/P: 1. Buprenorphine maintenance: She should continue on her present dose for now. He current provider does not wish to wean her during her pregnancy. We discussed the small risk for neonatal withdrawal 2. Chronic hepatitis C: She will be coming in to your office in 2 weeks and would check AST and ALT. If they are still elevated I would consider referral to a gastroenterologist. Otherwise such referral can wait until after pregnancy. As she has a detectable viral load her risk for vertical transmission  is present, but some studies have shown that her lower viral load may have a role in preventing transmission. No change in the route of delivery has been shown to influence rate of vertical transmission. Prolonged ROM and invasive procedures such as fetal scalp electrodes should be avoided if possible  Thank you for the opportunity to be a part of the care of [employer]. Please contact our office if we can be of further assistance.   I spent approximately 30 minutes with this patient with over 50% of time spent in face-to-face counseling.

## 2017-01-20 ENCOUNTER — Inpatient Hospital Stay (HOSPITAL_COMMUNITY): Payer: 59

## 2017-01-20 ENCOUNTER — Encounter (HOSPITAL_COMMUNITY): Payer: Self-pay | Admitting: *Deleted

## 2017-01-20 ENCOUNTER — Inpatient Hospital Stay (HOSPITAL_COMMUNITY)
Admission: AD | Admit: 2017-01-20 | Discharge: 2017-01-20 | Disposition: A | Discharge status: Home / Self Care | Payer: 59 | Source: Ambulatory Visit | Attending: Emergency Medicine | Admitting: Emergency Medicine

## 2017-01-20 DIAGNOSIS — O3482 Maternal care for other abnormalities of pelvic organs, second trimester: Secondary | ICD-10-CM | POA: Insufficient documentation

## 2017-01-20 DIAGNOSIS — Z3A19 19 weeks gestation of pregnancy: Secondary | ICD-10-CM | POA: Insufficient documentation

## 2017-01-20 DIAGNOSIS — F419 Anxiety disorder, unspecified: Secondary | ICD-10-CM | POA: Diagnosis not present

## 2017-01-20 DIAGNOSIS — F329 Major depressive disorder, single episode, unspecified: Secondary | ICD-10-CM | POA: Insufficient documentation

## 2017-01-20 DIAGNOSIS — N83201 Unspecified ovarian cyst, right side: Secondary | ICD-10-CM | POA: Insufficient documentation

## 2017-01-20 DIAGNOSIS — R102 Pelvic and perineal pain: Secondary | ICD-10-CM

## 2017-01-20 DIAGNOSIS — Z87442 Personal history of urinary calculi: Secondary | ICD-10-CM | POA: Insufficient documentation

## 2017-01-20 DIAGNOSIS — R109 Unspecified abdominal pain: Secondary | ICD-10-CM | POA: Diagnosis present

## 2017-01-20 DIAGNOSIS — O26892 Other specified pregnancy related conditions, second trimester: Secondary | ICD-10-CM

## 2017-01-20 DIAGNOSIS — O99342 Other mental disorders complicating pregnancy, second trimester: Secondary | ICD-10-CM | POA: Insufficient documentation

## 2017-01-20 DIAGNOSIS — O26899 Other specified pregnancy related conditions, unspecified trimester: Secondary | ICD-10-CM

## 2017-01-20 LAB — URINALYSIS, ROUTINE W REFLEX MICROSCOPIC
BILIRUBIN URINE: NEGATIVE
Glucose, UA: NEGATIVE mg/dL
Hgb urine dipstick: NEGATIVE
Ketones, ur: NEGATIVE mg/dL
LEUKOCYTES UA: NEGATIVE
NITRITE: NEGATIVE
PH: 5 (ref 5.0–8.0)
Protein, ur: NEGATIVE mg/dL
SPECIFIC GRAVITY, URINE: 1.015 (ref 1.005–1.030)

## 2017-01-20 LAB — RAPID URINE DRUG SCREEN, HOSP PERFORMED
AMPHETAMINES: NOT DETECTED
BARBITURATES: NOT DETECTED
BENZODIAZEPINES: NOT DETECTED
Cocaine: NOT DETECTED
Opiates: POSITIVE — AB
Tetrahydrocannabinol: POSITIVE — AB

## 2017-01-20 LAB — COMPREHENSIVE METABOLIC PANEL
ALK PHOS: 73 U/L (ref 38–126)
ALT: 58 U/L — AB (ref 14–54)
AST: 41 U/L (ref 15–41)
Albumin: 3.8 g/dL (ref 3.5–5.0)
Anion gap: 10 (ref 5–15)
BUN: 8 mg/dL (ref 6–20)
CALCIUM: 9.2 mg/dL (ref 8.9–10.3)
CHLORIDE: 103 mmol/L (ref 101–111)
CO2: 22 mmol/L (ref 22–32)
CREATININE: 0.65 mg/dL (ref 0.44–1.00)
GFR calc Af Amer: >60 mL/min (ref >60)
Glucose, Bld: 128 mg/dL — ABNORMAL HIGH (ref 65–99)
Potassium: 3.9 mmol/L (ref 3.5–5.1)
Sodium: 135 mmol/L (ref 135–145)
Total Bilirubin: 0.2 mg/dL — ABNORMAL LOW (ref 0.3–1.2)
Total Protein: 7.3 g/dL (ref 6.5–8.1)

## 2017-01-20 LAB — CBC
HCT: 36 % (ref 36.0–46.0)
HEMOGLOBIN: 12.7 g/dL (ref 12.0–15.0)
MCH: 30.6 pg (ref 26.0–34.0)
MCHC: 35.3 g/dL (ref 30.0–36.0)
MCV: 86.7 fL (ref 78.0–100.0)
PLATELETS: 163 10*3/uL (ref 150–400)
RBC: 4.15 MIL/uL (ref 3.87–5.11)
RDW: 12.9 % (ref 11.5–15.5)
WBC: 14 10*3/uL — AB (ref 4.0–10.5)

## 2017-01-20 LAB — LIPASE, BLOOD: LIPASE: 22 U/L (ref 11–51)

## 2017-01-20 LAB — AMYLASE: Amylase: 58 U/L (ref 28–100)

## 2017-01-20 MED ORDER — ONDANSETRON HCL 4 MG/2ML IJ SOLN
4.0000 mg | Freq: Once | INTRAMUSCULAR | Status: AC
  Administered 2017-01-20: 4 mg via INTRAVENOUS
  Filled 2017-01-20: qty 2

## 2017-01-20 MED ORDER — PYRIDOXINE HCL 100 MG/ML IJ SOLN
100.0000 mg | Freq: Every day | INTRAMUSCULAR | Status: DC
  Filled 2017-01-20: qty 1

## 2017-01-20 MED ORDER — MORPHINE SULFATE (PF) 4 MG/ML IV SOLN
6.0000 mg | Freq: Once | INTRAVENOUS | Status: AC
  Administered 2017-01-20: 6 mg via INTRAVENOUS
  Filled 2017-01-20: qty 2

## 2017-01-20 MED ORDER — FLEET ENEMA 7-19 GM/118ML RE ENEM
1.0000 | ENEMA | Freq: Once | RECTAL | Status: AC
  Administered 2017-01-20: 1 via RECTAL

## 2017-01-20 MED ORDER — ONDANSETRON 4 MG PO TBDP
4.0000 mg | ORAL_TABLET | Freq: Once | ORAL | Status: AC
Start: 2017-01-20 — End: 2017-01-20
  Administered 2017-01-20: 4 mg via ORAL
  Filled 2017-01-20: qty 1

## 2017-01-20 MED ORDER — KETAMINE HCL 10 MG/ML IJ SOLN: 0.3000 mg/kg | Freq: Once | INTRAMUSCULAR | Status: DC

## 2017-01-20 MED ORDER — PROMETHAZINE HCL 25 MG/ML IJ SOLN
25.0000 mg | Freq: Once | INTRAMUSCULAR | Status: DC
  Filled 2017-01-20: qty 1

## 2017-01-20 MED ORDER — HYDROMORPHONE HCL 1 MG/ML IJ SOLN
1.0000 mg | Freq: Once | INTRAMUSCULAR | Status: AC
  Administered 2017-01-20: 1 mg via INTRAMUSCULAR
  Filled 2017-01-20: qty 1

## 2017-01-20 MED ORDER — DOXYLAMINE SUCCINATE (SLEEP) 25 MG PO TABS
25.0000 mg | ORAL_TABLET | Freq: Once | ORAL | Status: DC
  Filled 2017-01-20: qty 1

## 2017-01-20 MED ORDER — SODIUM CHLORIDE 0.9 % IV BOLUS (SEPSIS)
1000.0000 mL | Freq: Once | INTRAVENOUS | Status: AC
  Administered 2017-01-20: 1000 mL via INTRAVENOUS

## 2017-01-20 MED ORDER — MILK AND MOLASSES ENEMA: 1.0000 | Freq: Once | RECTAL | Status: DC

## 2017-01-20 MED ORDER — PROMETHAZINE HCL 25 MG/ML IJ SOLN
25.0000 mg | Freq: Once | INTRAMUSCULAR | Status: AC
  Administered 2017-01-20: 25 mg via INTRAMUSCULAR

## 2017-01-20 MED ORDER — KETAMINE HCL-SODIUM CHLORIDE 100-0.9 MG/10ML-% IV SOSY
0.3000 mg/kg | PREFILLED_SYRINGE | Freq: Once | INTRAVENOUS | Status: AC
  Administered 2017-01-20: 33 mg via INTRAVENOUS
  Filled 2017-01-20: qty 10

## 2017-01-20 MED ORDER — KETOROLAC TROMETHAMINE 30 MG/ML IJ SOLN
30.0000 mg | Freq: Once | INTRAMUSCULAR | Status: AC
  Administered 2017-01-20: 30 mg via INTRAVENOUS
  Filled 2017-01-20: qty 1

## 2017-01-20 NOTE — MAU Provider Note (Signed)
Patient Abigail Fox is a 31 y.o. G3P0020 At [redacted]w[redacted]d here with complaints of abdominal pain. Her medical history is significant for Hep C and substance abuse.  She has a history of miscarriage and constipation.  She denies bleeding or leaking of fluid.  History     CSN: [id number]  Arrival date and time: 01/20/17 9562   None     Chief Complaint  Patient presents with  . Abdominal Pain   Abdominal Pain  This is a new problem. The current episode started today. The onset quality is sudden. The problem occurs constantly. The problem has been unchanged. The pain is located in the LLQ, suprapubic region and RLQ. The pain is at a severity of 10/10. The quality of the pain is sharp. Associated symptoms include constipation. Nothing aggravates the pain. The pain is relieved by nothing.    Her last bowel movement was two days ago; she describes it as hard.   OB History    Gravida Para Term Preterm AB Living   3 0   0 2 0   SAB TAB Ectopic Multiple Live Births   1              Past Medical History:  Diagnosis Date  . Anxiety   . Depression   . Headache   . Kidney stones   . MRSA infection 2010,2013  . Ovarian cyst     Past Surgical History:  Procedure Laterality Date  . DILATION AND EVACUATION N/A 05/14/2016   Procedure: DILATATION AND EVACUATION (D&E) 2ND TRIMESTER WITH ULTRA SOUND GUIDANCE;  Surgeon: Tereso Newcomer, MD;  Location: [facility];  Service: Gynecology;  Laterality: N/A;  . miscarriage    . TOOTH EXTRACTION      Family History  Problem Relation Age of Onset  . Diabetes Mother   . Hypertension Mother   . Heart disease Maternal Grandfather     Social History  Substance Use Topics  . Smoking status: Never Smoker  . Smokeless tobacco: Never Used  . Alcohol use No    Allergies: No Known Allergies  Prescriptions Prior to Admission  Medication Sig Dispense Refill Last Dose  . buprenorphine (SUBUTEX) 8 MG SUBL SL tablet Place 8 mg under the tongue 2 (two)  times daily.  0 Taking  . calcium carbonate (TUMS - DOSED IN MG ELEMENTAL CALCIUM) 500 MG chewable tablet Chew 2 tablets by mouth 4 (four) times daily as needed for indigestion or heartburn.    Taking  . Doxylamine-Pyridoxine (DICLEGIS PO) Take by mouth.   Taking  . ondansetron (ZOFRAN ODT) 4 MG disintegrating tablet Take 1 tablet (4 mg total) by mouth every 8 (eight) hours as needed for nausea or vomiting. 20 tablet 1 Taking  . Prenatal Vit w/Fe-Methylfol-FA (PNV PO) Take by mouth.   Taking  . valACYclovir (VALTREX) 500 MG tablet Take 500 mg by mouth daily.    Taking    Review of Systems  Constitutional: Negative.   HENT: Negative.   Gastrointestinal: Positive for abdominal pain and constipation.  Genitourinary: Negative.   Neurological: Negative.   Psychiatric/Behavioral: Negative.    Physical Exam   Blood pressure 138/87, pulse 74, temperature 98.5 F (36.9 C), temperature source Oral, resp. rate 20, last menstrual period 09/06/2016, SpO2 99 %, unknown if currently breastfeeding.  Physical Exam  Constitutional: She is oriented to person, place, and time. She appears well-developed and well-nourished.  Neck: Normal range of motion.  Respiratory: Effort normal.  Genitourinary: Vagina normal.  Musculoskeletal:  Normal range of motion.  Neurological: She is alert and oriented to person, place, and time.  Skin: Skin is warm and dry.  Psychiatric: She has a normal mood and affect.  NEFG: cervix is long, closed and thick.   MAU Course  Procedures  MDM -Fleet enema x 2 -Zofran 4 mg  for nausea -CBC, CMP, amylase, lipase Dr. Dion Body at the bedside to evaluate patient @ 0830; Dr. Dion Body will order CT scan.   Report and co-management by Raelyn Mora, MSN, CNM @ [address] [place], MSN, CNM 01/20/2017, 7:17 AM    *TC from Dr. Dion Body @ 1100 - recommend having patient transferred to [facility] for further evaluation/work-up -- agree with transfer, Dr. Dion Body will call  to speak to [facility] MD  Assessment and Plan  Abdominal pain complicating pregnancy  Pelvic pain during pregnancy   - Transfer to [facility] via [facility]  Raelyn Mora, CNM 01/20/2017 11:25 AM

## 2017-01-20 NOTE — Progress Notes (Signed)
Waiting for Dr. Dion Body to return call.  Patient requesting a refill of Diclegis as well as some pain medicine prior to transport over to [facility] ED.  Dr Dion Body returned call after patient left building.  Dr. Dion Body states someone from the office will call patient regarding Diclegis refill.  Pt [facility] in [facility]

## 2017-01-20 NOTE — Progress Notes (Signed)
BP (!) 146/80   Pulse 75   Temp 98.5 F (36.9 C) (Oral)   Resp 20   LMP 09/06/2016   SpO2 99%    Arrived at bedside.  Pt with abdominal pain, writhing in the bed.  Cervix closed x 2.  Pt denies bleeding or LOF.  Pt has a h/o constipation.  Wants to sit at bedside commode to relieve the pain.  Gen:  Distressed Abd:  Soft, no rebound or guarding.  Difficult to determine if abdomen is tender from palpation or intrabdominal process.  A/P  Second trimester pregnancy Abdominal pain. Unclear etiology but suspect GI in origin. Get CT imaging and/or pelvic ultrasound while waiting. Discussed plan with MAU provider and husband at bedside.

## 2017-01-20 NOTE — ED Notes (Signed)
Pt continues to have extreme pain, yelling, dry heaves

## 2017-01-20 NOTE — ED Notes (Signed)
Patient transported to MRI 

## 2017-01-20 NOTE — ED Notes (Signed)
Took patient vitals patient is resting with family at bedside and family at bedside

## 2017-01-20 NOTE — ED Provider Notes (Signed)
The patient refused MRI and she states that she would not tolerate the closure.  I offered her something to help calm her and she still refused.  The patient would like to be discharged home.  The patient does have negative ultrasounds show any hydronephrosis and does not have any signs of kidney impairment at this point.  Patient will need follow-up with GYN and urology   Charlestine Night, PA-C 01/20/17 1943    Linwood Dibbles, MD 01/20/17 2206

## 2017-01-20 NOTE — MAU Note (Signed)
Patient states she woke up around 0100 with LLQ abdominal stabbing pain and also feeling like she needed to have a bowel movement.  Pain was intermittent, but now feels constant.  States vomited x5 this morning.

## 2017-01-20 NOTE — ED Notes (Signed)
Pt departed in NAD.  

## 2017-01-20 NOTE — ED Notes (Signed)
Called to room by patient, pt and family very upset, stating the MRI was too small for her and they have been here all day with no care or results. Pt appears extremily anxious stating "the tech" stated the machine was too tight for her. Pt demands IV be removed and she will return.  Pt states she has had little to no relief from Morphine or other medications given during this visit.  Female visitor at bedside very upset as well, attempts made to explain to patient situation.   PA made aware, this RN spoke with MRI tech, pt was not too small for machine, pt refused to lie back and be placed in machine, several attempts made and meds ordered but pt refused to attempt to be scanned at that point.   PA made aware MRI situation while at bedside

## 2017-01-20 NOTE — Discharge Instructions (Signed)
Return here as needed.  Follow-up with the urologist provided.  Follow-up with your GYN

## 2017-01-20 NOTE — ED Provider Notes (Signed)
[facility]-EMERGENCY DEPT Provider Note   CSN: [id number] Arrival date & time: 01/20/17  2130     History   Chief Complaint Chief Complaint  Patient presents with  . Abdominal Pain    HPI Abigail Fox is a 31 y.o. female.  HPI   31 year old female who is [redacted] weeks pregnant, with history of kidney stones, ovarian cysts, depression anxiety, and opiate abuse presenting to the ER from [facility] for evaluation of abdominal pain. Patient developed acute onset of pain to her low abdomen and her left low back that started early this morning. Pain is sharp, very intense, she felt nauseous and has vomited multiple episodes. She tries taking her Subutex but does not think she was able to keep down. She is requesting for pain medication. She denies having fever, chills, chest pain, trouble breathing, dysuria, vaginal bleeding or vaginal discharge. She is a G3 P0. [facility] She had a pelvic exam as well as a pelvic ultrasound which showed no concerning feature. She received Toradol and sent here for further evaluation. An abdominal and pelvis MRI was ordered by the provider.  Patient however does not report any significant pain to her right lower quadrant.  Past Medical History:  Diagnosis Date  . Anxiety   . Depression   . Headache   . Kidney stones   . MRSA infection 2010,2013  . Ovarian cyst     Patient Active Problem List   Diagnosis Date Noted  . Abdominal pain complicating pregnancy 01/20/2017  . Marijuana use 04/11/2016    Past Surgical History:  Procedure Laterality Date  . DILATION AND EVACUATION N/A 05/14/2016   Procedure: DILATATION AND EVACUATION (D&E) 2ND TRIMESTER WITH ULTRA SOUND GUIDANCE;  Surgeon: Tereso Newcomer, MD;  Location: [facility];  Service: Gynecology;  Laterality: N/A;  . miscarriage    . TOOTH EXTRACTION      OB History    Gravida Para Term Preterm AB Living   3 0   0 2 0   SAB TAB Ectopic Multiple Live  Births   1               Home Medications    Prior to Admission medications   Medication Sig Start Date End Date Taking? Authorizing Provider  buprenorphine (SUBUTEX) 8 MG SUBL SL tablet Place 8 mg under the tongue 2 (two) times daily. 10/23/16  Yes [provider]  calcium carbonate (TUMS - DOSED IN MG ELEMENTAL CALCIUM) 500 MG chewable tablet Chew 2 tablets by mouth 4 (four) times daily as needed for indigestion or heartburn.    Yes [provider]  DICLEGIS 10-10 MG TBEC TAKE 1 TABLET EVERY MORNING , TAKE 1 TABLET EVERY EVENING AND TAKE 2 TABLET AT BEDTIME 12/23/16  Yes [provider]  ondansetron (ZOFRAN ODT) 4 MG disintegrating tablet Take 1 tablet (4 mg total) by mouth every 8 (eight) hours as needed for nausea or vomiting. 10/26/16  Yes Montez Morita, CNM  Prenatal Vit w/Fe-Methylfol-FA (PNV PO) Take 1 tablet by mouth daily.    Yes [provider]  valACYclovir (VALTREX) 500 MG tablet Take 500 mg by mouth daily.    Yes [provider]    Family History Family History  Problem Relation Age of Onset  . Diabetes Mother   . Hypertension Mother   . Heart disease Maternal Grandfather     Social History Social History  Substance Use Topics  . Smoking  status: Never Smoker  . Smokeless tobacco: Never Used  . Alcohol use No     Allergies   Patient has no known allergies.   Review of Systems Review of Systems  All other systems reviewed and are negative.    Physical Exam Updated Vital Signs BP (!) 144/79 (BP Location: Right Arm)   Pulse 83   Temp 98.3 F (36.8 C) (Oral)   Resp 20   LMP 09/06/2016   SpO2 100%   Physical Exam  Constitutional: She appears well-developed and well-nourished.  Obese female writhing in bed appears very uncomfortable.  HENT:  Head: Atraumatic.  Eyes: Conjunctivae are normal.  Neck: Neck supple.  Cardiovascular: Normal rate and regular rhythm.   Pulmonary/Chest: Effort normal and breath  sounds normal.  Abdominal: Soft. Bowel sounds are normal. She exhibits no distension. There is tenderness (Mild suprapubic tenderness without guarding or rebound tenderness).  Genitourinary:  Genitourinary Comments: There is  L CVA tenderness on percussion  Neurological: She is alert.  Skin: No rash noted.  Psychiatric: She has a normal mood and affect.  Nursing note and vitals reviewed.    ED Treatments / Results  Labs (all labs ordered are listed, but only abnormal results are displayed) Labs Reviewed  URINALYSIS, ROUTINE W REFLEX MICROSCOPIC - Abnormal; Notable for the following:       Result Value   APPearance HAZY (*)    All other components within normal limits  CBC - Abnormal; Notable for the following:    WBC 14.0 (*)    All other components within normal limits  COMPREHENSIVE METABOLIC PANEL - Abnormal; Notable for the following:    Glucose, Bld 128 (*)    ALT 58 (*)    Total Bilirubin 0.2 (*)    All other components within normal limits  RAPID URINE DRUG SCREEN, HOSP PERFORMED - Abnormal; Notable for the following:    Opiates POSITIVE (*)    Tetrahydrocannabinol POSITIVE (*)    All other components within normal limits  LIPASE, BLOOD  AMYLASE    EKG  EKG Interpretation None       Radiology US Pelvis Limited  Result Date: 01/20/2017 CLINICAL DATA:  Second trimester pregnancy. Bilateral lower quadrant pain beginning last night. Evaluate for ovarian cysts and torsion. EXAM: LIMITED ULTRASOUND OF PELVIS DOPPLER ULTRASOUND OF OVARIES TECHNIQUE: Limited transabdominal ultrasound examination of the pelvis was performed to evaluate the ovaries and adnexa regions only. Color and duplex Doppler ultrasound was utilized to evaluate blood flow to the ovaries. COMPARISON:  None. FINDINGS: Right ovary Measurements: 2.0 x 2.1 x 2.6 cm. 1.6 cm benign-appearing cyst noted. Otherwise normal appearance/no adnexal mass. Left ovary Measurements: 2.8 x 1.6 x 1.5 cm. Normal  appearance/no adnexal mass. Pulsed Doppler evaluation demonstrates normal low-resistance arterial and venous waveforms in both ovaries. IMPRESSION: 1.6 cm benign-appearing right ovarian cyst. No ovarian or adnexal mass identified. No sonographic evidence for ovarian torsion. Electronically Signed   By: Myles Rosenthal M.D.   On: 01/20/2017 10:16   US Abdominal Pelvic Art/vent Flow Doppler  Result Date: 01/20/2017 CLINICAL DATA:  Second trimester pregnancy. Bilateral lower quadrant pain beginning last night. Evaluate for ovarian cysts and torsion. EXAM: LIMITED ULTRASOUND OF PELVIS DOPPLER ULTRASOUND OF OVARIES TECHNIQUE: Limited transabdominal ultrasound examination of the pelvis was performed to evaluate the ovaries and adnexa regions only. Color and duplex Doppler ultrasound was utilized to evaluate blood flow to the ovaries. COMPARISON:  None. FINDINGS: Right ovary Measurements: 2.0 x 2.1 x 2.6 cm. 1.6  cm benign-appearing cyst noted. Otherwise normal appearance/no adnexal mass. Left ovary Measurements: 2.8 x 1.6 x 1.5 cm. Normal appearance/no adnexal mass. Pulsed Doppler evaluation demonstrates normal low-resistance arterial and venous waveforms in both ovaries. IMPRESSION: 1.6 cm benign-appearing right ovarian cyst. No ovarian or adnexal mass identified. No sonographic evidence for ovarian torsion. Electronically Signed   By: Myles Rosenthal M.D.   On: 01/20/2017 10:16    Procedures Procedures (including critical care time)  Medications Ordered in ED Medications  ondansetron (ZOFRAN) injection 4 mg (not administered)  sodium phosphate (FLEET) 7-19 GM/118ML enema 1 enema (1 enema Rectal Given 01/20/17 0759)  ondansetron (ZOFRAN-ODT) disintegrating tablet 4 mg (4 mg Oral Given 01/20/17 0813)  sodium phosphate (FLEET) 7-19 GM/118ML enema 1 enema (1 enema Rectal Given 01/20/17 0814)  ketorolac (TORADOL) 30 MG/ML injection 30 mg (30 mg Intravenous Given 01/20/17 1039)  ketamine 100 mg in normal saline 10 mL  ( /mL) syringe (33 mg Intravenous Given 01/20/17 1325)  sodium chloride 0.9 % bolus 1,000 mL (0 mLs Intravenous Stopped 01/20/17 1516)  ondansetron (ZOFRAN) injection 4 mg (4 mg Intravenous Given 01/20/17 1415)  morphine 4 MG/ML injection 6 mg (6 mg Intravenous Given 01/20/17 1535)     Initial Impression / Assessment and Plan / ED Course  I have reviewed the triage vital signs and the nursing notes.  Pertinent labs & imaging results that were available during my care of the patient were reviewed by me and considered in my medical decision making (see chart for details).     BP (!) 144/79 (BP Location: Right Arm)   Pulse 83   Temp 98.3 F (36.8 C) (Oral)   Resp 20   LMP 09/06/2016   SpO2 100%    Final Clinical Impressions(s) / ED Diagnoses   Final diagnoses:  Abdominal pain complicating pregnancy    New Prescriptions New Prescriptions   No medications on file   12:54 PM Patient here with pain to her left flank and lower abdomen. She does have history of kidney stones, pain suggestive of kidney stone related. She is currently [redacted] weeks pregnant. [facility]. It was felt that the pain is not likely to be related to her pregnancy.  I discussed with Dr. Clarene Duke, given that she is currently on Subutex and her pain is not well controlled, we will give pt ketamine at 0.3mg /kg for analgesic dosage to help control pain.  At this time I have low suspicion of appendicitis causing her pain and discomfort. If [place] negative, then may consider abd/pelvis MRI for further evaluation.  Pt requesting for Diclegis for her nausea.  Will give doxylamine/vitB6.  IVF started.   1:46 PM Pt monitored closely, appears more comfortable after administration of pain medication.  Her labs remarkable for WBC 14, and UDS with positive opiate and tetrahydrocannabinol.  ?cannabinoid hyperemesis syndrome.  3:09 PM Patient initially felt better after receiving ketamine  analgesic dose however she is currently very uncomfortable and requesting for additional pain medication. Will give morphine. Currently awaits ultrasound result.  3:32 PM Renal US showing no hydronephrosis or nephrolithiasis.  SInce pt does not have any blood in her urine, and she is still in moderate discomfort and abd pain and nausea, vomiting plan to continue with abd/pelvis MRI for further evaluation of her condition.  Pt sign out to oncoming provider for further management.  I did performed bedside US to confirm IUP with fetal heart rate of 142.  Marland Kitchen  Fayrene Helper, PA-C 01/20/17 1542    Little, Ambrose Finland, MD 01/25/17 [phone]

## 2017-04-06 ENCOUNTER — Inpatient Hospital Stay (HOSPITAL_COMMUNITY): Payer: 59

## 2017-04-06 ENCOUNTER — Observation Stay (HOSPITAL_COMMUNITY)
Admission: AD | Admit: 2017-04-06 | Discharge: 2017-04-08 | Disposition: A | Discharge status: Home / Self Care | Payer: 59 | Source: Ambulatory Visit | Attending: Obstetrics and Gynecology | Admitting: Obstetrics and Gynecology

## 2017-04-06 DIAGNOSIS — O210 Mild hyperemesis gravidarum: Principal | ICD-10-CM | POA: Insufficient documentation

## 2017-04-06 DIAGNOSIS — O26899 Other specified pregnancy related conditions, unspecified trimester: Secondary | ICD-10-CM

## 2017-04-06 DIAGNOSIS — O26613 Liver and biliary tract disorders in pregnancy, third trimester: Secondary | ICD-10-CM | POA: Insufficient documentation

## 2017-04-06 DIAGNOSIS — O26893 Other specified pregnancy related conditions, third trimester: Secondary | ICD-10-CM | POA: Diagnosis present

## 2017-04-06 DIAGNOSIS — R52 Pain, unspecified: Secondary | ICD-10-CM

## 2017-04-06 DIAGNOSIS — Z3A30 30 weeks gestation of pregnancy: Secondary | ICD-10-CM | POA: Diagnosis not present

## 2017-04-06 DIAGNOSIS — K8020 Calculus of gallbladder without cholecystitis without obstruction: Secondary | ICD-10-CM | POA: Diagnosis not present

## 2017-04-06 DIAGNOSIS — O99320 Drug use complicating pregnancy, unspecified trimester: Secondary | ICD-10-CM

## 2017-04-06 DIAGNOSIS — F1120 Opioid dependence, uncomplicated: Secondary | ICD-10-CM

## 2017-04-06 DIAGNOSIS — R109 Unspecified abdominal pain: Secondary | ICD-10-CM

## 2017-04-06 LAB — COMPREHENSIVE METABOLIC PANEL
ALT: 45 U/L (ref 14–54)
AST: 32 U/L (ref 15–41)
Albumin: 3.5 g/dL (ref 3.5–5.0)
Alkaline Phosphatase: 80 U/L (ref 38–126)
Anion gap: 12 (ref 5–15)
BUN: 7 mg/dL (ref 6–20)
CO2: 20 mmol/L — ABNORMAL LOW (ref 22–32)
Calcium: 9 mg/dL (ref 8.9–10.3)
Chloride: 99 mmol/L — ABNORMAL LOW (ref 101–111)
Creatinine, Ser: 0.71 mg/dL (ref 0.44–1.00)
GFR calc Af Amer: >60 mL/min (ref >60)
GFR calc non Af Amer: >60 mL/min (ref >60)
Glucose, Bld: 101 mg/dL — ABNORMAL HIGH (ref 65–99)
Potassium: 3.9 mmol/L (ref 3.5–5.1)
Sodium: 131 mmol/L — ABNORMAL LOW (ref 135–145)
Total Bilirubin: 0.5 mg/dL (ref 0.3–1.2)
Total Protein: 6.9 g/dL (ref 6.5–8.1)

## 2017-04-06 LAB — CBC
HCT: 34.9 % — ABNORMAL LOW (ref 36.0–46.0)
Hemoglobin: 11.9 g/dL — ABNORMAL LOW (ref 12.0–15.0)
MCH: 29.5 pg (ref 26.0–34.0)
MCHC: 34.1 g/dL (ref 30.0–36.0)
MCV: 86.4 fL (ref 78.0–100.0)
Platelets: 156 10*3/uL (ref 150–400)
RBC: 4.04 MIL/uL (ref 3.87–5.11)
RDW: 13.6 % (ref 11.5–15.5)
WBC: 14 10*3/uL — ABNORMAL HIGH (ref 4.0–10.5)

## 2017-04-06 LAB — URINALYSIS, ROUTINE W REFLEX MICROSCOPIC
BILIRUBIN URINE: NEGATIVE
GLUCOSE, UA: NEGATIVE mg/dL
HGB URINE DIPSTICK: NEGATIVE
KETONES UR: 5 mg/dL — AB
Leukocytes, UA: NEGATIVE
NITRITE: NEGATIVE
PH: 5 (ref 5.0–8.0)
Protein, ur: NEGATIVE mg/dL
SPECIFIC GRAVITY, URINE: 1.015 (ref 1.005–1.030)

## 2017-04-06 LAB — WET PREP, GENITAL
Clue Cells Wet Prep HPF POC: NONE SEEN
Sperm: NONE SEEN
Trich, Wet Prep: NONE SEEN
Yeast Wet Prep HPF POC: NONE SEEN

## 2017-04-06 LAB — FETAL FIBRONECTIN: Fetal Fibronectin: NEGATIVE

## 2017-04-06 LAB — LIPASE, BLOOD: Lipase: 23 U/L (ref 11–51)

## 2017-04-06 LAB — GROUP B STREP BY PCR: Group B strep by PCR: NEGATIVE

## 2017-04-06 LAB — AMYLASE: Amylase: 54 U/L (ref 28–100)

## 2017-04-06 MED ORDER — LACTATED RINGERS IV SOLN
INTRAVENOUS | Status: DC
  Administered 2017-04-07: via INTRAVENOUS

## 2017-04-06 MED ORDER — PROMETHAZINE HCL 25 MG/ML IJ SOLN
12.5000 mg | Freq: Once | INTRAMUSCULAR | Status: AC
Start: 2017-04-06 — End: 2017-04-06
  Administered 2017-04-06: 12.5 mg via INTRAVENOUS
  Filled 2017-04-06: qty 1

## 2017-04-06 MED ORDER — SODIUM CHLORIDE 0.9 % IV SOLN
8.0000 mg | Freq: Once | INTRAVENOUS | Status: AC
  Administered 2017-04-06: 8 mg via INTRAVENOUS
  Filled 2017-04-06: qty 4

## 2017-04-06 MED ORDER — LACTATED RINGERS IV BOLUS (SEPSIS)
1000.0000 mL | Freq: Once | INTRAVENOUS | Status: AC
  Administered 2017-04-06: 1000 mL via INTRAVENOUS

## 2017-04-06 NOTE — MAU Note (Signed)
Pt report abdominal pain all day long. Denies LOF or Bleeding.

## 2017-04-06 NOTE — MAU Provider Note (Signed)
History     CSN: [id number]  Arrival date & time 04/06/17  2121   None     Chief Complaint  Patient presents with  . Abdominal Pain  . Vaginal Bleeding  . Nausea    Arrived at bedside.  Pt with abdominal pain, writhing in the bed.  Cervix closed. No bleeding noted from cervix. Cultures collected  Pt denies bleeding or LOF.  Pt has a h/o constipation.States she has thrown up 8 times today.   Gen:  Distressed Abd:  Soft, no rebound or guarding.  Difficult to determine if abdomen is tender from palpation or intrabdominal process.  A/P  Third  trimester pregnancy Abdominal pain. Unclear etiology but suspect GI in origin. Get CT imaging   Discussed plan  husband at bedside.      Past Medical History:  Diagnosis Date  . Anxiety   . Depression   . Headache   . Kidney stones   . MRSA infection 2010,2013  . Ovarian cyst     Past Surgical History:  Procedure Laterality Date  . DILATION AND EVACUATION N/A 05/14/2016   Procedure: DILATATION AND EVACUATION (D&E) 2ND TRIMESTER WITH ULTRA SOUND GUIDANCE;  Surgeon: Tereso NewcomerUgonna A Anyanwu, MD;  Location: [facility];  Service: Gynecology;  Laterality: N/A;  . miscarriage    . TOOTH EXTRACTION      Family History  Problem Relation Age of Onset  . Diabetes Mother   . Hypertension Mother   . Heart disease Maternal Grandfather     Social History   Tobacco Use  . Smoking status: Never Smoker  . Smokeless tobacco: Never Used  Substance Use Topics  . Alcohol use: No  . Drug use: No    Comment: last one month ago    OB History    Gravida Para Term Preterm AB Living   3 0   0 2 0   SAB TAB Ectopic Multiple Live Births   1              Review of Systems  Gastrointestinal: Positive for abdominal pain, nausea and vomiting.  All other systems reviewed and are negative.   Allergies  Patient has no known allergies.  Home Medications    BP 132/69 (BP Location: Left Arm)   Pulse 74   Temp 98.7 F (37.1 C) (Axillary)   Wt  247 lb (112 kg)   LMP 09/06/2016   SpO2 100%   BMI 42.40 kg/m   Physical Exam  Constitutional: She is oriented to person, place, and time. She appears well-developed and well-nourished.  Cardiovascular: Normal rate and regular rhythm.  Pulmonary/Chest: Effort normal and breath sounds normal. No respiratory distress.  Abdominal: Soft. There is tenderness.  Genitourinary: Vagina normal. No vaginal discharge found.  Musculoskeletal: Normal range of motion.  Neurological: She is alert and oriented to person, place, and time.  Skin: Skin is warm and dry.  Nursing note and vitals reviewed.  FHT's Cat 1 Uc's- None MAU Course  Procedures (including critical care time)  Labs Reviewed  WET PREP, GENITAL - Abnormal; Notable for the following components:      Result Value   WBC, Wet Prep HPF POC FEW (*)    All other components within normal limits  URINALYSIS, ROUTINE W REFLEX MICROSCOPIC - Abnormal; Notable for the following components:   APPearance HAZY (*)    Ketones, ur 5 (*)    All other components within normal limits  CBC - Abnormal; Notable for the following components:  WBC 14.0 (*)    Hemoglobin 11.9 (*)    HCT 34.9 (*)    All other components within normal limits  COMPREHENSIVE METABOLIC PANEL - Abnormal; Notable for the following components:   Sodium 131 (*)    Chloride 99 (*)    CO2 20 (*)    Glucose, Bld 101 (*)    All other components within normal limits  GROUP B STREP BY PCR  LIPASE, BLOOD  AMYLASE  FETAL FIBRONECTIN  GC/CHLAMYDIA PROBE AMP ([place]) NOT AT [facility]   No results found. Lipase     Component Value Date/Time   LIPASE 23 04/06/2017 2212   Amylase    Component Value Date/Time   AMYLASE 54 04/06/2017 2212   [place] Pelvis Limited (transabdominal Only)  Result Date: 04/07/2017 CLINICAL DATA:  Abdominal pain with nausea and vomiting. Thirty weeks pregnant. Elevated white cell count. EXAM: ULTRASOUND ABDOMEN LIMITED TECHNIQUE: Wallace CullensGray scale imaging  of the right lower quadrant was performed to evaluate for suspected appendicitis. Standard imaging planes and graded compression technique were utilized. COMPARISON:  None. FINDINGS: The appendix is not visualized. Ancillary findings: Bowel gas is demonstrated in the right lower quadrant. No fluid collections. Factors affecting image quality: Examination is technically limited due to patient's body habitus. IMPRESSION: Appendix is not visualized. Note: Non-visualization of appendix by US does not definitely exclude appendicitis. If there is sufficient clinical concern, consider abdomen pelvis CT with contrast for further evaluation. Electronically Signed   By: Burman NievesWilliam  Stevens M.D.   On: 04/07/2017 01:19   [place] Abdomen Limited Ruq  Result Date: 04/07/2017 CLINICAL DATA:  Abdominal pain, nausea, and vomiting. Elevated white cell count. Hepatitis-C positive. [redacted] weeks pregnant patient. EXAM: ULTRASOUND ABDOMEN LIMITED RIGHT UPPER QUADRANT COMPARISON:  None. FINDINGS: Gallbladder: Small stones are demonstrated in the dependent gallbladder measuring up to 10 mm maximal dimension. No sludge or wall thickening. Murphy's sign is negative. Common bile duct: Diameter: 4.6 mm, normal Liver: No focal lesion identified. Within normal limits in parenchymal echogenicity. Portal vein is patent on color Doppler imaging with normal direction of blood flow towards the liver. IMPRESSION: Cholelithiasis. No additional findings to suggest acute cholecystitis. Electronically Signed   By: Burman NievesWilliam  Stevens M.D.   On: 04/07/2017 01:17   1. Abdominal pain complicating pregnancy   2. Pain    3. Cholelithiasis 4. Nausea/vomiting 5. Ketonuria 6. Hx of Opiod Addiction- On subutex 8mg  q day  Admit to antenatal 23 hour Observation Subutex  IVF hydration Antiemetics Continuous fetal monitoring.   MDM  After labs, ultrasounds and pt continued to have nausea and vomiting. She will be admitted for 23 hour observation with repeat  cbc at 7am   [facility] Argelia Formisano CNM

## 2017-04-07 ENCOUNTER — Inpatient Hospital Stay (HOSPITAL_COMMUNITY): Payer: 59

## 2017-04-07 ENCOUNTER — Other Ambulatory Visit: Payer: Self-pay

## 2017-04-07 ENCOUNTER — Observation Stay (HOSPITAL_COMMUNITY): Payer: 59

## 2017-04-07 DIAGNOSIS — O210 Mild hyperemesis gravidarum: Secondary | ICD-10-CM | POA: Diagnosis not present

## 2017-04-07 DIAGNOSIS — Z3A30 30 weeks gestation of pregnancy: Secondary | ICD-10-CM

## 2017-04-07 LAB — TYPE AND SCREEN
ABO/RH(D): O POS
ANTIBODY SCREEN: NEGATIVE

## 2017-04-07 LAB — MRSA PCR SCREENING: MRSA by PCR: NEGATIVE

## 2017-04-07 LAB — GC/CHLAMYDIA PROBE AMP (~~LOC~~) NOT AT ARMC
Chlamydia: NEGATIVE
Neisseria Gonorrhea: NEGATIVE

## 2017-04-07 MED ORDER — SODIUM CHLORIDE 0.9 % IV SOLN
8.0000 mg | Freq: Three times a day (TID) | INTRAVENOUS | Status: DC
  Administered 2017-04-07 – 2017-04-08 (×6): 8 mg via INTRAVENOUS
  Filled 2017-04-07 (×7): qty 4

## 2017-04-07 MED ORDER — PANTOPRAZOLE SODIUM 40 MG IV SOLR
40.0000 mg | Freq: Two times a day (BID) | INTRAVENOUS | Status: DC
  Administered 2017-04-07 – 2017-04-08 (×4): 40 mg via INTRAVENOUS
  Filled 2017-04-07 (×5): qty 40

## 2017-04-07 MED ORDER — PROMETHAZINE HCL 25 MG/ML IJ SOLN
12.5000 mg | INTRAMUSCULAR | Status: DC | PRN
  Administered 2017-04-07 – 2017-04-08 (×2): 25 mg via INTRAVENOUS
  Filled 2017-04-07 (×2): qty 1

## 2017-04-07 MED ORDER — BUPRENORPHINE HCL 8 MG SL SUBL
8.0000 mg | SUBLINGUAL_TABLET | Freq: Two times a day (BID) | SUBLINGUAL | Status: DC
  Administered 2017-04-07 – 2017-04-08 (×3): 8 mg via SUBLINGUAL
  Filled 2017-04-07 (×3): qty 1

## 2017-04-07 MED ORDER — DEXTROSE-NACL 5-0.45 % IV SOLN
INTRAVENOUS | Status: DC
  Administered 2017-04-07 – 2017-04-08 (×6): via INTRAVENOUS

## 2017-04-07 MED ORDER — LORAZEPAM 2 MG/ML IJ SOLN
1.0000 mg | Freq: Once | INTRAMUSCULAR | Status: AC
  Administered 2017-04-07: 1 mg via INTRAVENOUS

## 2017-04-07 MED ORDER — PROMETHAZINE HCL 25 MG/ML IJ SOLN
12.5000 mg | Freq: Four times a day (QID) | INTRAMUSCULAR | Status: DC | PRN
  Administered 2017-04-07: 25 mg via INTRAVENOUS
  Filled 2017-04-07: qty 1

## 2017-04-07 MED ORDER — ACETAMINOPHEN 10 MG/ML IV SOLN
1000.0000 mg | Freq: Four times a day (QID) | INTRAVENOUS | Status: AC
  Administered 2017-04-07 – 2017-04-08 (×4): 1000 mg via INTRAVENOUS
  Filled 2017-04-07 (×5): qty 100

## 2017-04-07 MED ORDER — PROMETHAZINE HCL 25 MG/ML IJ SOLN
25.0000 mg | INTRAMUSCULAR | Status: AC
  Administered 2017-04-07: 25 mg via INTRAVENOUS
  Filled 2017-04-07: qty 1

## 2017-04-07 MED ORDER — PYRIDOXINE HCL 100 MG/ML IJ SOLN
50.0000 mg | Freq: Two times a day (BID) | INTRAMUSCULAR | Status: DC
  Administered 2017-04-07 – 2017-04-08 (×4): 50 mg via INTRAVENOUS
  Filled 2017-04-07 (×5): qty 0.5

## 2017-04-07 MED ORDER — IOPAMIDOL (ISOVUE-300) INJECTION 61%
100.0000 mL | Freq: Once | INTRAVENOUS | Status: AC | PRN
  Administered 2017-04-07: 100 mL via INTRAVENOUS

## 2017-04-07 MED ORDER — DIPHENHYDRAMINE HCL 50 MG/ML IJ SOLN
25.0000 mg | INTRAMUSCULAR | Status: AC
  Administered 2017-04-07: 25 mg via INTRAVENOUS
  Filled 2017-04-07: qty 1

## 2017-04-07 MED ORDER — BUPRENORPHINE HCL 8 MG SL SUBL
8.0000 mg | SUBLINGUAL_TABLET | Freq: Every day | SUBLINGUAL | Status: DC
  Administered 2017-04-07: 8 mg via SUBLINGUAL
  Filled 2017-04-07: qty 1

## 2017-04-07 NOTE — Progress Notes (Addendum)
Temp:  [97.9 F (36.6 C)-98.7 F (37.1 C)] 98.3 F (36.8 C) (12/31 1553) Pulse Rate:  [68-84] 69 (12/31 1553) Resp:  [18-20] 20 (12/31 1553) BP: (91-132)/(47-77) 94/47 (12/31 1553) SpO2:  [97 %-100 %] 98 % (12/31 1553) Weight:  [112 kg (247 lb)] 112 kg (247 lb) (12/31 0800)   Pt feeling better. IV Tylenol helped. Abdomen still gets tight in her LUQ  Pt tolerated Sprite.  Still has nausea but wants to drink Sprite.  Interested in going home.  CT Abd/Pelvis-Neg  Plan:  Abdominal pain-resolving.    -All imaging has been negative.  I suspect pain is related to intractable  N/V. Hyperemesis-  -Cont antiemetics.    -Advance to clear diet tonight.  If tolerated, convert IV medications to po.   -Discharge home when tolerating clear/regular diet.  I will work on Product/process development scientistgetting  Diclegis approved with [place] Tracks by submitting hospital documentation.  Pain controlled.  Continue Tylenol 1 gram IV prn.  Limit to 3 grams daily especially due to pt's h/o Hepatitis C. Opioid Dependence-  Subutex changed from daily to BID.  F/u drug screen.    CCOB covering New years Day.  Pt informed.

## 2017-04-07 NOTE — Progress Notes (Signed)
Abigail AcostaChelsea Fox is a 31 y.o. G3P0020 at 5877w3d .  Subjective: Pt with c/o diffuse abdominal pain.  Last BM yesterday, soft.  Pt has not used Diclegis in 1 week b/c it is not covered by Medicaid.  She has been using Unisom and Vit B6.  She was doing well for a few days until vomiting started  Yesterday.  Once she stated vomiting, pain started. She denies bleeding or LOF  Objective: BP 124/63 (BP Location: Left Arm)   Pulse 71   Temp 98.2 F (36.8 C) (Oral)   Resp 20   Wt 112 kg (247 lb)   LMP 09/06/2016   SpO2 99%   BMI 42.40 kg/m  I/O last 3 completed shifts: In: -  Out: 700 [Urine:250; Emesis/NG output:450] No intake/output data recorded.  FHT:  Good variability, no decels UC:   none SVE:   Dilation: Closed Exam by:: L. Clemmons, CNM Abdominal US- neg RUQ US-Gallstones, CBD normal.  No inflammatory changes.  CMP     Component Value Date/Time   NA 131 (L) 04/06/2017 2212   K 3.9 04/06/2017 2212   CL 99 (L) 04/06/2017 2212   CO2 20 (L) 04/06/2017 2212   GLUCOSE 101 (H) 04/06/2017 2212   BUN 7 04/06/2017 2212   CREATININE 0.71 04/06/2017 2212   CALCIUM 9.0 04/06/2017 2212   PROT 6.9 04/06/2017 2212   ALBUMIN 3.5 04/06/2017 2212   AST 32 04/06/2017 2212   ALT 45 04/06/2017 2212   ALKPHOS 80 04/06/2017 2212   BILITOT 0.5 04/06/2017 2212   GFRNONAA >60 04/06/2017 2212   GFRAA >60 04/06/2017 2212   Labs: Lab Results  Component Value Date   WBC 14.0 (H) 04/06/2017   HGB 11.9 (L) 04/06/2017   HCT 34.9 (L) 04/06/2017   MCV 86.4 04/06/2017   PLT 156 04/06/2017    Assessment / Plan: IUP @ 30 3/7 weeks. Abdominal pain of unclear etiology, 2nd episode.  Suspect it is related to  intractable n/v  -Add Protonix 40 mg IV q 12 hours  -CT of Abdomen and Pelvis of pain uncontrolled.  -Urine culture. Hyperemesis. Off medications.  -Continue IV Zofran 8 mg q 8 hours.  -Add Phenergan 12.5-25 mg IV q4-6 hours and Vit B6  -Attempt to get samples of Diclegis.  -Strict  I/Os. Hepatitis C normal LFTs.  Viral load very low. Cholelithiasis.  No s/sxs of cholecystitis. H/o opoid dependence.  On Subuitex  -Resume home dosage.  -Check Urine drug screen.   Cat I tracing.  No signs of PTL    Abigail Fox 04/07/2017, 8:09 AM

## 2017-04-07 NOTE — Progress Notes (Signed)
Dr. Dion BodyVarnado contact for patient reporting pain 7/10. Orders received for IV Tylenol. Will continue to monitor. Carmelina DaneERRI L Nikolai Wilczak, RN

## 2017-04-07 NOTE — Progress Notes (Signed)
Pt reporting 10/10 abdominal pain as well as vomiting. [facility] that [facility] are "[facility]ting her". Dr. Dion BodyVarnado at bedside. POC discussed. Pt verbalized an understanding. Carmelina DaneERRI L Reggie Welge, RN

## 2017-04-08 DIAGNOSIS — O210 Mild hyperemesis gravidarum: Secondary | ICD-10-CM | POA: Diagnosis not present

## 2017-04-08 LAB — CBC
HCT: 29.3 % — ABNORMAL LOW (ref 36.0–46.0)
Hemoglobin: 10.2 g/dL — ABNORMAL LOW (ref 12.0–15.0)
MCH: 30.1 pg (ref 26.0–34.0)
MCHC: 34.8 g/dL (ref 30.0–36.0)
MCV: 86.4 fL (ref 78.0–100.0)
Platelets: 123 10*3/uL — ABNORMAL LOW (ref 150–400)
RBC: 3.39 MIL/uL — ABNORMAL LOW (ref 3.87–5.11)
RDW: 14 % (ref 11.5–15.5)
WBC: 8.6 10*3/uL (ref 4.0–10.5)

## 2017-04-08 MED ORDER — ONDANSETRON HCL 8 MG PO TABS: 8.0000 mg | ORAL_TABLET | Freq: Three times a day (TID) | ORAL | 0 refills | Status: DC | PRN

## 2017-04-08 MED ORDER — PANTOPRAZOLE SODIUM 40 MG PO TBEC: 40.0000 mg | DELAYED_RELEASE_TABLET | Freq: Two times a day (BID) | ORAL | 1 refills | Status: DC

## 2017-04-08 MED ORDER — PROMETHAZINE HCL 25 MG PO TABS: 25.0000 mg | ORAL_TABLET | Freq: Four times a day (QID) | ORAL | 1 refills | Status: DC | PRN

## 2017-04-08 NOTE — Progress Notes (Signed)
Abigail AcostaChelsea Fox [id number]  Subjective: Strip and Chart Reviewed.  Objective:  Vitals:   04/08/17 1200 04/08/17 1604 04/08/17 2019 04/08/17 2221  BP: (!) 84/60 113/68 (!) 109/57 119/66  Pulse: 75 92 75 73  Resp: 20 18 18 18   Temp: 98.3 F (36.8 C) 98.8 F (37.1 C) 98 F (36.7 C) 98.5 F (36.9 C)  TempSrc: Oral Oral Oral Oral  SpO2: 100% 100% 100% 99%  Weight:      Height:        FHR: 135 bpm, Mod Var, +Decels, +10x10 Accels UC: None Graphed  Assessment: IUP at 1964w4d Overall Reassuring   Plan: -Discharge to home as planned -Patient to f/u as appropriate  Sabas SousJ. Anamaria Dusenbury, CNM 04/08/2017 11:24 PM

## 2017-04-08 NOTE — Discharge Instructions (Signed)

## 2017-04-08 NOTE — Progress Notes (Signed)
Hospital day # 0 pregnancy at 3070w4d  S: well, reports good fetal activity, states she had pain last night but is feeling better this morning      Contractions:none      Vaginal bleeding:none       Vaginal discharge: no significant change  O: BP (!) 103/52 (BP Location: Right Arm)   Pulse 69   Temp 98.2 F (36.8 C) (Oral)   Resp 20   Ht 5\' 4"  (1.626 m)   Wt 247 lb (112 kg)   LMP 09/06/2016   SpO2 100%   BMI 42.40 kg/m       Fetal tracings:reviewed and reassuring      Uterus gravid and non-tender      Extremities: no significant edema and no signs of DVT  Intake/Output Summary (Last 24 hours) at 04/08/2017 1029 Last data filed at 04/08/2017 0800 Gross per 24 hour  Intake 1735 ml  Output 2875 ml  Net -1140 ml   A: 5570w4d with abdominal pain     Stable     Hyperemesis     Cholelithiasis     Hepatitis C     H/o of Opiod Dependence-on Subutex      P: continue current plan of care  Elmore GuiseLori A Clemmons  CNM 04/08/2017 10:28 AM

## 2017-04-08 NOTE — Discharge Summary (Signed)
ANTENATAL DISCHARGE SUMMARY  Patient ID: Abigail Fox MRN: [id number] DOB/AGE: [birth year] 32 y.o.  Admit date: 04/06/2017 Discharge date: 04/08/2017  Admission Diagnoses: 30WKS PAIN  Discharge Diagnoses: 30WKS PAIN         Discharged Condition: Hyperemesis Gravidarum Hospital Course: Antiemetics and IVF have improved Nausea and vomiting. Tolerated 2 regular meals today and pt wishes to go home after Subutex dose at 1000pm  Consults:   Treatments: {  Disposition: home   Allergies as of 04/08/2017   No Known Allergies     Medication List    TAKE these medications   buprenorphine 8 MG Subl SL tablet Commonly known as:  SUBUTEX Place 8 mg under the tongue 2 (two) times daily.   calcium carbonate 500 MG chewable tablet Commonly known as:  TUMS - dosed in mg elemental calcium Chew 2 tablets by mouth 4 (four) times daily as needed for indigestion or heartburn.   DICLEGIS 10-10 MG Tbec Generic drug:  Doxylamine-Pyridoxine TAKE 1 TABLET EVERY MORNING , TAKE 1 TABLET EVERY EVENING AND TAKE 2 TABLET AT BEDTIME   ondansetron 4 MG disintegrating tablet Commonly known as:  ZOFRAN ODT Take 1 tablet (4 mg total) by mouth every 8 (eight) hours as needed for nausea or vomiting.   ondansetron 8 MG tablet Commonly known as:  ZOFRAN Take 1 tablet (8 mg total) by mouth every 8 (eight) hours as needed for nausea or vomiting.   pantoprazole 40 MG tablet Commonly known as:  PROTONIX Take 1 tablet (40 mg total) by mouth 2 (two) times daily.   PNV PO Take 1 tablet by mouth daily.   promethazine 25 MG tablet Commonly known as:  PHENERGAN Take 1 tablet (25 mg total) by mouth every 6 (six) hours as needed for nausea or vomiting.   valACYclovir 500 MG tablet Commonly known as:  VALTREX Take 500 mg by mouth daily.      Follow-up Information    Geryl RankinsVarnado, Evelyn, MD. Schedule an appointment as soon as possible for a visit in 3 day(s).   Specialty:  Obstetrics and Gynecology Contact  information: [address] [place] [address] [phone]           Signed: Rhea PinkLori A Kehinde Bowdish, CNM  04/08/2017, 8:07 PM

## 2017-04-10 LAB — PANEL 799049
CARBOXY THC GC/MS CONF: 177 ng/mL
Cannabinoid GC/MS, Ur: POSITIVE — AB

## 2017-04-10 LAB — URINE DRUGS OF ABUSE SCREEN W ALC, ROUTINE (REF LAB)
Amphetamines, Urine: NEGATIVE ng/mL
BARBITURATE, UR: NEGATIVE ng/mL
BENZODIAZEPINE QUANT UR: NEGATIVE ng/mL
Cocaine (Metab.): NEGATIVE ng/mL
ETHANOL U, QUAN: NEGATIVE %
Methadone Screen, Urine: NEGATIVE ng/mL
PROPOXYPHENE, URINE: NEGATIVE ng/mL
Phencyclidine, Ur: NEGATIVE ng/mL

## 2017-04-10 LAB — OPIATES CONFIRMATION, URINE: OPIATES: NEGATIVE

## 2017-05-09 ENCOUNTER — Other Ambulatory Visit: Payer: Self-pay | Admitting: Obstetrics and Gynecology

## 2017-05-09 DIAGNOSIS — O26843 Uterine size-date discrepancy, third trimester: Secondary | ICD-10-CM

## 2017-05-09 DIAGNOSIS — Z3A33 33 weeks gestation of pregnancy: Secondary | ICD-10-CM

## 2017-05-09 DIAGNOSIS — O365931 Maternal care for other known or suspected poor fetal growth, third trimester, fetus 1: Secondary | ICD-10-CM

## 2017-05-09 DIAGNOSIS — Z3689 Encounter for other specified antenatal screening: Secondary | ICD-10-CM

## 2017-05-13 ENCOUNTER — Encounter (HOSPITAL_COMMUNITY): Payer: Self-pay

## 2017-05-15 ENCOUNTER — Ambulatory Visit (HOSPITAL_COMMUNITY)
Admission: RE | Admit: 2017-05-15 | Discharge: 2017-05-15 | Disposition: A | Discharge status: Home / Self Care | Payer: Medicaid Other | Source: Ambulatory Visit | Attending: Obstetrics and Gynecology | Admitting: Obstetrics and Gynecology

## 2017-05-15 ENCOUNTER — Other Ambulatory Visit (HOSPITAL_COMMUNITY): Payer: Self-pay | Admitting: *Deleted

## 2017-05-15 ENCOUNTER — Other Ambulatory Visit: Payer: Self-pay | Admitting: Obstetrics and Gynecology

## 2017-05-15 ENCOUNTER — Ambulatory Visit (HOSPITAL_COMMUNITY): Admission: RE | Admit: 2017-05-15 | Payer: Medicaid Other | Source: Ambulatory Visit

## 2017-05-15 DIAGNOSIS — Z3A35 35 weeks gestation of pregnancy: Secondary | ICD-10-CM | POA: Insufficient documentation

## 2017-05-15 DIAGNOSIS — O365931 Maternal care for other known or suspected poor fetal growth, third trimester, fetus 1: Secondary | ICD-10-CM

## 2017-05-15 DIAGNOSIS — O26843 Uterine size-date discrepancy, third trimester: Secondary | ICD-10-CM

## 2017-05-15 DIAGNOSIS — O99323 Drug use complicating pregnancy, third trimester: Secondary | ICD-10-CM | POA: Diagnosis not present

## 2017-05-15 DIAGNOSIS — Z3689 Encounter for other specified antenatal screening: Secondary | ICD-10-CM

## 2017-05-15 DIAGNOSIS — O365930 Maternal care for other known or suspected poor fetal growth, third trimester, not applicable or unspecified: Secondary | ICD-10-CM | POA: Insufficient documentation

## 2017-05-15 DIAGNOSIS — Z3A33 33 weeks gestation of pregnancy: Secondary | ICD-10-CM

## 2017-05-15 DIAGNOSIS — O98413 Viral hepatitis complicating pregnancy, third trimester: Secondary | ICD-10-CM | POA: Insufficient documentation

## 2017-05-15 DIAGNOSIS — B182 Chronic viral hepatitis C: Secondary | ICD-10-CM | POA: Insufficient documentation

## 2017-05-15 DIAGNOSIS — O26899 Other specified pregnancy related conditions, unspecified trimester: Secondary | ICD-10-CM

## 2017-05-15 DIAGNOSIS — F1120 Opioid dependence, uncomplicated: Secondary | ICD-10-CM

## 2017-05-15 DIAGNOSIS — R109 Unspecified abdominal pain: Secondary | ICD-10-CM

## 2017-05-15 DIAGNOSIS — O99320 Drug use complicating pregnancy, unspecified trimester: Principal | ICD-10-CM

## 2017-05-15 HISTORY — DX: Inflammatory liver disease, unspecified: K75.9

## 2017-05-15 NOTE — Procedures (Signed)
Staci AcostaChelsea Alderete 11/04/1985 2128w6d  Fetus A Non-Stress Test Interpretation for 05/15/17  Indication: Unsatisfactory BPP  Fetal Heart Rate A Mode: External Baseline Rate (A): 130 bpm Variability: Moderate Accelerations: 15 x 15 Decelerations: None Multiple birth?: No  Uterine Activity Mode: Palpation, Toco Contraction Frequency (min): 5-7 Contraction Duration (sec): 50-70 Contraction Quality: Mild Resting Tone Palpated: Relaxed Resting Time: Adequate  Interpretation (Fetal Testing) Nonstress Test Interpretation: Reactive Overall Impression: Reassuring for gestational age Comments: Reviewed tracing with Dr. Sherrie Georgeecker

## 2017-05-16 ENCOUNTER — Encounter (HOSPITAL_COMMUNITY): Payer: 59

## 2017-05-16 ENCOUNTER — Ambulatory Visit (HOSPITAL_COMMUNITY): Payer: 59

## 2017-05-19 ENCOUNTER — Encounter (HOSPITAL_COMMUNITY): Payer: Self-pay

## 2017-05-22 ENCOUNTER — Encounter (HOSPITAL_COMMUNITY): Payer: Self-pay

## 2017-05-22 ENCOUNTER — Ambulatory Visit (HOSPITAL_COMMUNITY)
Admission: RE | Admit: 2017-05-22 | Discharge: 2017-05-22 | Disposition: A | Discharge status: Home / Self Care | Payer: Medicaid Other | Source: Ambulatory Visit | Attending: Obstetrics and Gynecology | Admitting: Obstetrics and Gynecology

## 2017-05-22 DIAGNOSIS — O365930 Maternal care for other known or suspected poor fetal growth, third trimester, not applicable or unspecified: Secondary | ICD-10-CM | POA: Insufficient documentation

## 2017-05-22 DIAGNOSIS — O09893 Supervision of other high risk pregnancies, third trimester: Secondary | ICD-10-CM | POA: Diagnosis not present

## 2017-05-22 DIAGNOSIS — O99320 Drug use complicating pregnancy, unspecified trimester: Principal | ICD-10-CM

## 2017-05-22 DIAGNOSIS — Z3A36 36 weeks gestation of pregnancy: Secondary | ICD-10-CM | POA: Insufficient documentation

## 2017-05-22 DIAGNOSIS — E669 Obesity, unspecified: Secondary | ICD-10-CM | POA: Insufficient documentation

## 2017-05-22 DIAGNOSIS — B182 Chronic viral hepatitis C: Secondary | ICD-10-CM | POA: Diagnosis not present

## 2017-05-22 DIAGNOSIS — O98413 Viral hepatitis complicating pregnancy, third trimester: Secondary | ICD-10-CM | POA: Insufficient documentation

## 2017-05-22 DIAGNOSIS — F1120 Opioid dependence, uncomplicated: Secondary | ICD-10-CM

## 2017-05-22 DIAGNOSIS — O99213 Obesity complicating pregnancy, third trimester: Secondary | ICD-10-CM | POA: Insufficient documentation

## 2017-05-22 NOTE — Procedures (Signed)
Staci AcostaChelsea Witte 1986/04/04 2330w6d  Fetus A Non-Stress Test Interpretation for 05/22/17  Indication: Unsatisfactory BPP  Fetal Heart Rate A Mode: External Baseline Rate (A): 130 bpm Variability: Moderate Accelerations: 10 x 10, 15 x 15 Decelerations: None Multiple birth?: No  Uterine Activity Mode: Palpation, Toco Contraction Frequency (min): U/I Contraction Duration (sec): 40 Contraction Quality: Mild Resting Tone Palpated: Relaxed Resting Time: Adequate  Interpretation (Fetal Testing) Nonstress Test Interpretation: Reactive Comments: Reviewed tracing with Dr. Sherrie Georgeecker

## 2017-05-28 ENCOUNTER — Encounter (HOSPITAL_COMMUNITY): Payer: Self-pay

## 2017-05-29 ENCOUNTER — Other Ambulatory Visit (HOSPITAL_COMMUNITY): Payer: Self-pay | Admitting: Maternal and Fetal Medicine

## 2017-05-29 ENCOUNTER — Ambulatory Visit (HOSPITAL_COMMUNITY)
Admission: RE | Admit: 2017-05-29 | Discharge: 2017-05-29 | Disposition: A | Discharge status: Home / Self Care | Payer: Medicaid Other | Source: Ambulatory Visit | Attending: Obstetrics and Gynecology | Admitting: Obstetrics and Gynecology

## 2017-05-29 ENCOUNTER — Encounter (HOSPITAL_COMMUNITY): Payer: Self-pay

## 2017-05-29 ENCOUNTER — Other Ambulatory Visit (HOSPITAL_COMMUNITY): Payer: Self-pay | Admitting: *Deleted

## 2017-05-29 DIAGNOSIS — O99213 Obesity complicating pregnancy, third trimester: Secondary | ICD-10-CM | POA: Insufficient documentation

## 2017-05-29 DIAGNOSIS — O99323 Drug use complicating pregnancy, third trimester: Secondary | ICD-10-CM | POA: Insufficient documentation

## 2017-05-29 DIAGNOSIS — O365930 Maternal care for other known or suspected poor fetal growth, third trimester, not applicable or unspecified: Secondary | ICD-10-CM | POA: Insufficient documentation

## 2017-05-29 DIAGNOSIS — E669 Obesity, unspecified: Secondary | ICD-10-CM | POA: Insufficient documentation

## 2017-05-29 DIAGNOSIS — B182 Chronic viral hepatitis C: Secondary | ICD-10-CM

## 2017-05-29 DIAGNOSIS — O99320 Drug use complicating pregnancy, unspecified trimester: Secondary | ICD-10-CM

## 2017-05-29 DIAGNOSIS — Z3A37 37 weeks gestation of pregnancy: Secondary | ICD-10-CM

## 2017-05-29 DIAGNOSIS — O09293 Supervision of pregnancy with other poor reproductive or obstetric history, third trimester: Secondary | ICD-10-CM | POA: Insufficient documentation

## 2017-05-29 DIAGNOSIS — F1120 Opioid dependence, uncomplicated: Secondary | ICD-10-CM

## 2017-05-29 DIAGNOSIS — O98419 Viral hepatitis complicating pregnancy, unspecified trimester: Secondary | ICD-10-CM

## 2017-05-29 DIAGNOSIS — O98413 Viral hepatitis complicating pregnancy, third trimester: Secondary | ICD-10-CM | POA: Diagnosis not present

## 2017-06-04 ENCOUNTER — Other Ambulatory Visit (HOSPITAL_COMMUNITY): Payer: Self-pay | Admitting: Obstetrics and Gynecology

## 2017-06-04 ENCOUNTER — Ambulatory Visit (HOSPITAL_COMMUNITY)
Admission: RE | Admit: 2017-06-04 | Discharge: 2017-06-04 | Disposition: A | Discharge status: Home / Self Care | Payer: Medicaid Other | Source: Ambulatory Visit | Attending: Obstetrics and Gynecology | Admitting: Obstetrics and Gynecology

## 2017-06-04 ENCOUNTER — Encounter (HOSPITAL_COMMUNITY): Payer: Self-pay

## 2017-06-04 DIAGNOSIS — O365930 Maternal care for other known or suspected poor fetal growth, third trimester, not applicable or unspecified: Secondary | ICD-10-CM | POA: Insufficient documentation

## 2017-06-04 DIAGNOSIS — Z3A38 38 weeks gestation of pregnancy: Secondary | ICD-10-CM | POA: Insufficient documentation

## 2017-06-06 ENCOUNTER — Telehealth (HOSPITAL_COMMUNITY): Payer: Self-pay | Admitting: *Deleted

## 2017-06-06 ENCOUNTER — Encounter (HOSPITAL_COMMUNITY): Payer: Self-pay | Admitting: *Deleted

## 2017-06-06 NOTE — Telephone Encounter (Signed)
Preadmission screen  

## 2017-06-07 ENCOUNTER — Inpatient Hospital Stay (HOSPITAL_COMMUNITY): Payer: Medicaid Other | Admitting: Anesthesiology

## 2017-06-07 ENCOUNTER — Inpatient Hospital Stay (HOSPITAL_COMMUNITY)
Admission: RE | Admit: 2017-06-07 | Discharge: 2017-06-10 | DRG: 787 | Disposition: A | Discharge status: Home / Self Care | Payer: Medicaid Other | Source: Ambulatory Visit | Attending: Obstetrics and Gynecology | Admitting: Obstetrics and Gynecology

## 2017-06-07 ENCOUNTER — Encounter (HOSPITAL_COMMUNITY): Payer: Self-pay

## 2017-06-07 ENCOUNTER — Other Ambulatory Visit: Payer: Self-pay

## 2017-06-07 VITALS — BP 118/65 | HR 65 | Temp 98.0°F | Resp 18 | Ht 64.0 in | Wt 258.0 lb

## 2017-06-07 DIAGNOSIS — O365930 Maternal care for other known or suspected poor fetal growth, third trimester, not applicable or unspecified: Principal | ICD-10-CM | POA: Diagnosis present

## 2017-06-07 DIAGNOSIS — F1120 Opioid dependence, uncomplicated: Secondary | ICD-10-CM | POA: Diagnosis present

## 2017-06-07 DIAGNOSIS — D509 Iron deficiency anemia, unspecified: Secondary | ICD-10-CM | POA: Diagnosis present

## 2017-06-07 DIAGNOSIS — O99824 Streptococcus B carrier state complicating childbirth: Secondary | ICD-10-CM | POA: Diagnosis present

## 2017-06-07 DIAGNOSIS — Z3490 Encounter for supervision of normal pregnancy, unspecified, unspecified trimester: Secondary | ICD-10-CM

## 2017-06-07 DIAGNOSIS — O99324 Drug use complicating childbirth: Secondary | ICD-10-CM | POA: Diagnosis present

## 2017-06-07 DIAGNOSIS — B001 Herpesviral vesicular dermatitis: Secondary | ICD-10-CM | POA: Diagnosis present

## 2017-06-07 DIAGNOSIS — O99214 Obesity complicating childbirth: Secondary | ICD-10-CM | POA: Diagnosis present

## 2017-06-07 DIAGNOSIS — Z3A39 39 weeks gestation of pregnancy: Secondary | ICD-10-CM

## 2017-06-07 DIAGNOSIS — O9852 Other viral diseases complicating childbirth: Secondary | ICD-10-CM | POA: Diagnosis present

## 2017-06-07 DIAGNOSIS — Z8614 Personal history of Methicillin resistant Staphylococcus aureus infection: Secondary | ICD-10-CM

## 2017-06-07 DIAGNOSIS — O9902 Anemia complicating childbirth: Secondary | ICD-10-CM | POA: Diagnosis present

## 2017-06-07 DIAGNOSIS — O26899 Other specified pregnancy related conditions, unspecified trimester: Secondary | ICD-10-CM

## 2017-06-07 DIAGNOSIS — R109 Unspecified abdominal pain: Secondary | ICD-10-CM

## 2017-06-07 DIAGNOSIS — Z98891 History of uterine scar from previous surgery: Secondary | ICD-10-CM

## 2017-06-07 LAB — CBC
HCT: 33.9 % — ABNORMAL LOW (ref 36.0–46.0)
HEMOGLOBIN: 11.5 g/dL — AB (ref 12.0–15.0)
MCH: 28.3 pg (ref 26.0–34.0)
MCHC: 33.9 g/dL (ref 30.0–36.0)
MCV: 83.5 fL (ref 78.0–100.0)
Platelets: 161 10*3/uL (ref 150–400)
RBC: 4.06 MIL/uL (ref 3.87–5.11)
RDW: 14.2 % (ref 11.5–15.5)
WBC: 12.8 10*3/uL — AB (ref 4.0–10.5)

## 2017-06-07 LAB — TYPE AND SCREEN
ABO/RH(D): O POS
Antibody Screen: NEGATIVE

## 2017-06-07 LAB — RPR: RPR: NONREACTIVE

## 2017-06-07 MED ORDER — OXYTOCIN 40 UNITS IN LACTATED RINGERS INFUSION - SIMPLE MED: 2.5000 [IU]/h | INTRAVENOUS | Status: DC

## 2017-06-07 MED ORDER — PHENYLEPHRINE 40 MCG/ML (10ML) SYRINGE FOR IV PUSH (FOR BLOOD PRESSURE SUPPORT)
80.0000 ug | PREFILLED_SYRINGE | INTRAVENOUS | Status: DC | PRN
  Filled 2017-06-07: qty 10

## 2017-06-07 MED ORDER — ONDANSETRON HCL 4 MG/2ML IJ SOLN: 4.0000 mg | Freq: Four times a day (QID) | INTRAMUSCULAR | Status: DC | PRN

## 2017-06-07 MED ORDER — PROMETHAZINE HCL 25 MG PO TABS
25.0000 mg | ORAL_TABLET | Freq: Four times a day (QID) | ORAL | Status: DC | PRN
  Administered 2017-06-07 – 2017-06-08 (×4): 25 mg via ORAL
  Filled 2017-06-07 (×4): qty 1

## 2017-06-07 MED ORDER — DOXYLAMINE-PYRIDOXINE 10-10 MG PO TBEC
1.0000 | DELAYED_RELEASE_TABLET | Freq: Four times a day (QID) | ORAL | Status: DC
  Administered 2017-06-07 – 2017-06-08 (×4): 1 via ORAL
  Filled 2017-06-07 (×10): qty 1

## 2017-06-07 MED ORDER — TERBUTALINE SULFATE 1 MG/ML IJ SOLN: 0.2500 mg | Freq: Once | INTRAMUSCULAR | Status: DC | PRN

## 2017-06-07 MED ORDER — SODIUM CHLORIDE 0.9 % IV SOLN: 5.0000 10*6.[IU] | Freq: Once | INTRAVENOUS | Status: DC

## 2017-06-07 MED ORDER — FLEET ENEMA 7-19 GM/118ML RE ENEM: 1.0000 | ENEMA | RECTAL | Status: DC | PRN

## 2017-06-07 MED ORDER — LIDOCAINE HCL (PF) 1 % IJ SOLN: 30.0000 mL | INTRAMUSCULAR | Status: DC | PRN

## 2017-06-07 MED ORDER — ZOLPIDEM TARTRATE 5 MG PO TABS: 5.0000 mg | ORAL_TABLET | Freq: Every evening | ORAL | Status: DC | PRN

## 2017-06-07 MED ORDER — ACETAMINOPHEN 325 MG PO TABS: 650.0000 mg | ORAL_TABLET | ORAL | Status: DC | PRN

## 2017-06-07 MED ORDER — EPHEDRINE 5 MG/ML INJ: 10.0000 mg | INTRAVENOUS | Status: DC | PRN

## 2017-06-07 MED ORDER — BUTORPHANOL TARTRATE 1 MG/ML IJ SOLN
1.0000 mg | INTRAMUSCULAR | Status: DC | PRN
  Administered 2017-06-07 (×4): 1 mg via INTRAVENOUS
  Filled 2017-06-07 (×4): qty 1

## 2017-06-07 MED ORDER — SODIUM CHLORIDE 0.9 % IV SOLN
5.0000 10*6.[IU] | Freq: Once | INTRAVENOUS | Status: AC
  Administered 2017-06-07: 5 10*6.[IU] via INTRAVENOUS
  Filled 2017-06-07: qty 5

## 2017-06-07 MED ORDER — OXYTOCIN 40 UNITS IN LACTATED RINGERS INFUSION - SIMPLE MED
1.0000 m[IU]/min | INTRAVENOUS | Status: DC
  Administered 2017-06-07 – 2017-06-08 (×2): 1 m[IU]/min via INTRAVENOUS
  Filled 2017-06-07 (×2): qty 1000

## 2017-06-07 MED ORDER — MISOPROSTOL 25 MCG QUARTER TABLET
25.0000 ug | ORAL_TABLET | ORAL | Status: DC | PRN
  Administered 2017-06-07 (×2): 25 ug via VAGINAL
  Filled 2017-06-07 (×2): qty 1

## 2017-06-07 MED ORDER — MISOPROSTOL 25 MCG QUARTER TABLET: 25.0000 ug | ORAL_TABLET | ORAL | Status: DC

## 2017-06-07 MED ORDER — DIPHENHYDRAMINE HCL 50 MG/ML IJ SOLN: 12.5000 mg | INTRAMUSCULAR | Status: DC | PRN

## 2017-06-07 MED ORDER — VALACYCLOVIR HCL 500 MG PO TABS
500.0000 mg | ORAL_TABLET | Freq: Every day | ORAL | Status: DC
  Filled 2017-06-07: qty 1

## 2017-06-07 MED ORDER — OXYCODONE-ACETAMINOPHEN 5-325 MG PO TABS: 1.0000 | ORAL_TABLET | ORAL | Status: DC | PRN

## 2017-06-07 MED ORDER — HYDROXYZINE HCL 25 MG PO TABS
25.0000 mg | ORAL_TABLET | ORAL | Status: DC | PRN
  Filled 2017-06-07: qty 1

## 2017-06-07 MED ORDER — LACTATED RINGERS IV SOLN: 500.0000 mL | Freq: Once | INTRAVENOUS | Status: DC

## 2017-06-07 MED ORDER — LACTATED RINGERS IV SOLN
INTRAVENOUS | Status: DC
  Administered 2017-06-07 (×2): via INTRAVENOUS

## 2017-06-07 MED ORDER — FENTANYL 2.5 MCG/ML BUPIVACAINE 1/10 % EPIDURAL INFUSION (WH - ANES)
14.0000 mL/h | INTRAMUSCULAR | Status: DC | PRN
  Administered 2017-06-07 – 2017-06-08 (×3): 14 mL/h via EPIDURAL
  Filled 2017-06-07 (×3): qty 100

## 2017-06-07 MED ORDER — PHENYLEPHRINE 40 MCG/ML (10ML) SYRINGE FOR IV PUSH (FOR BLOOD PRESSURE SUPPORT): 80.0000 ug | PREFILLED_SYRINGE | INTRAVENOUS | Status: DC | PRN

## 2017-06-07 MED ORDER — PANTOPRAZOLE SODIUM 40 MG PO TBEC
40.0000 mg | DELAYED_RELEASE_TABLET | Freq: Two times a day (BID) | ORAL | Status: DC
  Administered 2017-06-07: 40 mg via ORAL
  Filled 2017-06-07: qty 1

## 2017-06-07 MED ORDER — HYDROXYZINE HCL 50 MG PO TABS
25.0000 mg | ORAL_TABLET | Freq: Once | ORAL | Status: AC
  Administered 2017-06-07: 25 mg via ORAL
  Filled 2017-06-07: qty 1

## 2017-06-07 MED ORDER — ONDANSETRON 4 MG PO TBDP
4.0000 mg | ORAL_TABLET | Freq: Three times a day (TID) | ORAL | Status: DC | PRN
  Administered 2017-06-07 – 2017-06-08 (×3): 4 mg via ORAL
  Filled 2017-06-07 (×4): qty 1

## 2017-06-07 MED ORDER — OXYCODONE-ACETAMINOPHEN 5-325 MG PO TABS: 2.0000 | ORAL_TABLET | ORAL | Status: DC | PRN

## 2017-06-07 MED ORDER — LACTATED RINGERS IV SOLN
500.0000 mL | Freq: Once | INTRAVENOUS | Status: AC
  Administered 2017-06-08: 500 mL via INTRAVENOUS

## 2017-06-07 MED ORDER — BUPRENORPHINE HCL 8 MG SL SUBL
8.0000 mg | SUBLINGUAL_TABLET | Freq: Two times a day (BID) | SUBLINGUAL | Status: DC
  Administered 2017-06-07 – 2017-06-08 (×3): 8 mg via SUBLINGUAL
  Filled 2017-06-07 (×3): qty 1

## 2017-06-07 MED ORDER — LIDOCAINE HCL (PF) 1 % IJ SOLN
INTRAMUSCULAR | Status: DC | PRN
  Administered 2017-06-07: 2 mL via EPIDURAL
  Administered 2017-06-07: 5 mL via EPIDURAL
  Administered 2017-06-07: 3 mL via EPIDURAL

## 2017-06-07 MED ORDER — LACTATED RINGERS IV SOLN
500.0000 mL | INTRAVENOUS | Status: DC | PRN
  Administered 2017-06-07: 1000 mL via INTRAVENOUS
  Administered 2017-06-07: 500 mL via INTRAVENOUS
  Administered 2017-06-08: 1000 mL via INTRAVENOUS

## 2017-06-07 MED ORDER — OXYTOCIN BOLUS FROM INFUSION: 500.0000 mL | Freq: Once | INTRAVENOUS | Status: DC

## 2017-06-07 MED ORDER — PENICILLIN G POT IN DEXTROSE 60000 UNIT/ML IV SOLN
3.0000 10*6.[IU] | INTRAVENOUS | Status: DC
  Administered 2017-06-07 – 2017-06-08 (×3): 3 10*6.[IU] via INTRAVENOUS
  Filled 2017-06-07 (×5): qty 50

## 2017-06-07 MED ORDER — SOD CITRATE-CITRIC ACID 500-334 MG/5ML PO SOLN
30.0000 mL | ORAL | Status: DC | PRN
  Administered 2017-06-08: 30 mL via ORAL
  Filled 2017-06-07: qty 15

## 2017-06-07 MED ORDER — PENICILLIN G POT IN DEXTROSE 60000 UNIT/ML IV SOLN: 3.0000 10*6.[IU] | INTRAVENOUS | Status: DC

## 2017-06-07 NOTE — H&P (Signed)
Abigail AcostaChelsea Fox is a 32 y.o. female G3 P0020 @ 39 1/7 weeks c/w a 9 week ultrasound presenting for IOL due to H/o IUFD, SGA (EFW 16%, AC less than 8%).  EFW with MFM on 06/04/17 was 6 lbs.  [facility](Angelynn Lemus).  Pt had several ER visits, admissions for hyperemesis.  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 H/o Hep C-Low Viral load, normal LFTs H/o Anxiety and Depression H/o Oral HSV, on Valtrex  OB History    Gravida Para Term Preterm AB Living   3 0   0 2 0   SAB TAB Ectopic Multiple Live Births   1             Past Medical History:  Diagnosis Date  . Anxiety   . Depression    on prozac in the past  . Drug addiction (HCC)    subutex  . Headache   . Hepatitis    hepatitis C  . Kidney stones   . MRSA infection 2010,2013  . Ovarian cyst    Past Surgical History:  Procedure Laterality Date  . DILATION AND EVACUATION N/A 05/14/2016   Procedure: DILATATION AND EVACUATION (D&E) 2ND TRIMESTER WITH ULTRA SOUND GUIDANCE;  Surgeon: Tereso NewcomerUgonna A Anyanwu, MD;  Location: [facility];  Service: Gynecology;  Laterality: N/A;  . miscarriage    . TOOTH EXTRACTION     Family History: family history includes Diabetes in her mother; Heart disease in her maternal grandfather; Hypertension in her mother; Kidney cancer in her maternal grandmother; Lung cancer in her paternal grandmother. Social History:  reports that  has never smoked. she has never used smokeless tobacco. She reports that she uses drugs. Drug: Marijuana. She reports that she does not drink alcohol.     Maternal Diabetes: No Genetic Screening: Declined Maternal Ultrasounds/Referrals: Abnormal:  Findings:   Other:  SGA EFW 16%, AC 8% Fetal Ultrasounds or other Referrals:  Referred to Materal Fetal Medicine  Maternal Substance Abuse:  Yes:  Type: Other: On Subutex Significant Maternal Medications:  Meds  include: Protonix Other: Diclegis/Bonjesta, Zofran, Phenergan Significant Maternal Lab Results:  Lab values include: Group B Strep positive Other Comments:  H/o Anxiety and Depression  Review of Systems  Gastrointestinal: Negative for abdominal pain, nausea and vomiting.   Maternal Medical History:  Reason for admission: Nausea. Induction of labor  Contractions: Perceived severity is strong.    Prenatal Complications - Diabetes: none.    Dilation: 1 Effacement (%): Thick Station: -2 Exam by:: MD Varnardo Blood pressure 136/87, pulse 76, temperature 98.4 F (36.9 C), temperature source Oral, resp. rate 18, height 5\' 4"  (1.626 m), weight 117 kg (258 lb), last menstrual period 09/06/2016, unknown if currently breastfeeding. Maternal Exam:  Uterine Assessment: Contraction frequency is irregular.   Abdomen: Patient reports no abdominal tenderness. Estimated fetal weight is 6 lbs by ultrasound on 06/04/17.   Fetal presentation: vertex  Introitus: Normal vulva. Normal vagina.  Pelvis: adequate for delivery.      Fetal Exam Fetal Monitor Review: Mode: fetoscope.   Baseline rate: 140s .  Variability: moderate (6-25 bpm).   Pattern: accelerations present.    Fetal State Assessment: Category I - tracings are normal.     Physical Exam  Constitutional: She is oriented to person, place, and time. She appears well-developed and well-nourished.  HENT:  Head: Normocephalic and atraumatic.  Eyes: EOM are normal.  Neck: Normal range  of motion.  Respiratory: Effort normal.  GI: There is no tenderness.  Genitourinary: Vagina normal.  Musculoskeletal: Normal range of motion. She exhibits edema. She exhibits no tenderness.  Neurological: She is alert and oriented to person, place, and time.  Skin: Skin is warm.  Psychiatric:  Pt's baseline.    Prenatal labs: ABO, Rh: --/--/O POS (03/02 0047) Antibody: NEG (03/02 0047) Rubella: Immune (08/08 0000) RPR: Nonreactive (08/08 0000)   HBsAg: Negative (08/08 0000)  HIV: Non-reactive (08/08 0000)  GBS:   Positive  Assessment/Plan: IUP @ 39 1/7 weeks SGA, Small AC H/o Hyperemesis H/o substance abuse on Subutex H/o Hep C H/o IUFD  S/p Cytotec x 2.  Contractions starting. Start Pitocin if pt can not have 3rd dose of Cytotec. Stadol IV for pain.  Encouraged ambulation.  Epidural if pain unrelieved. Check CMP due to Hep C. Continue all scheduled home meds. Dr. Richardson Dopp on until 7 pm.  Pt is aware.    Geryl Rankins 06/07/2017, 9:17 AM

## 2017-06-07 NOTE — Progress Notes (Signed)
Abigail AcostaChelsea Fox is a 32 y.o. G3P0020 at 343w1d   Subjective: Pt comfortable s/p epidural.  Pt with nausea, s/p Zofran within the hour. Per RN, pt got Subutex, Diclegis at the same time around 5 pm.  Objective: BP 132/85 (BP Location: Right Arm)   Pulse 81   Temp 98.5 F (36.9 C) (Oral)   Resp 16   Ht 5\' 4"  (1.626 m)   Wt 117 kg (258 lb)   LMP 09/06/2016   SpO2 100%   BMI 44.29 kg/m  I/O last 3 completed shifts: In: -  Out: 750 [Urine:750] No intake/output data recorded.  FHT:  FHR: 130s bpm, variability: minimal ,  accelerations:  Present,  decelerations:  Present variable UC:   Irregular.  Not tracing well SVE:   Dilation: 4 Effacement (%): 70 Station: -2 Exam by:: Doloris HallJenny Middleton RN Cervix 3-4 cm AROM clear IUPC placed without difficulty to monitor contractions. Labs: Lab Results  Component Value Date   WBC 12.8 (H) 06/07/2017   HGB 11.5 (L) 06/07/2017   HCT 33.9 (L) 06/07/2017   MCV 83.5 06/07/2017   PLT 161 06/07/2017    Assessment / Plan: IUP @ 39 1/7 weeks  SGA, Small AC H/o Hyperemesis H/o substance abuse on Subutex H/o Hep C H/o IUFD    Labor: Progressing to active labor.  Pitocin held due to decreased variability but tracing now reassuring.  Suspect medications may have contributed to decreased variability.  Restart Pitocin at 1 mUs, increase by 2 mUs. Preeclampsia:  BP normal. Fetal Wellbeing:  Category II and Category I w/in the last hour. Pain Control:  Epidural I/D:  PCN #2 at 2030. Anticipated MOD:  NSVD  Abigail Fox 06/07/2017, 7:58 PM

## 2017-06-07 NOTE — Anesthesia Pain Management Evaluation Note (Signed)
  CRNA Pain Management Visit Note  Patient: Abigail AcostaChelsea Fox, 32 y.o., female  "Hello I am a member of the anesthesia team at [facility]. We have an anesthesia team available at all times to provide care throughout the hospital, including epidural management and anesthesia for C-section. I don't know your plan for the delivery whether it a natural birth, water birth, IV sedation, nitrous supplementation, doula or epidural, but we want to meet your pain goals."   1.Was your pain managed to your expectations on prior hospitalizations?   No prior hospitalizations  2.What is your expectation for pain management during this hospitalization?     Epidural  3.How can we help you reach that goal? Epidural  Record the patient's initial score and the patient's pain goal.   Pain: 7  Pain Goal: 3 [facility] wants you to be able to say your pain was always managed very well.  Rica RecordsICKELTON,Abigail Arthurs 06/07/2017

## 2017-06-07 NOTE — Anesthesia Procedure Notes (Signed)
Epidural Patient location during procedure: OB Start time: 06/07/2017 2:41 PM End time: 06/07/2017 2:46 PM  Staffing Anesthesiologist: Cecile Hearingurk, Lalisa Kiehn Edward, MD Performed: anesthesiologist   Preanesthetic Checklist Completed: patient identified, pre-op evaluation, timeout performed, IV checked, risks and benefits discussed and monitors and equipment checked  Epidural Patient position: sitting Prep: DuraPrep Patient monitoring: blood pressure and continuous pulse ox Approach: midline Location: L3-L4 Injection technique: LOR air  Needle:  Needle type: Tuohy  Needle gauge: 17 G Needle length: 9 cm Needle insertion depth: 7 cm Catheter size: 19 Gauge Catheter at skin depth: 12 cm Test dose: negative and Other (1% Lidocaine)  Additional Notes Patient identified.  Risk benefits discussed including failed block, incomplete pain control, headache, nerve damage, paralysis, blood pressure changes, nausea, vomiting, reactions to medication both toxic or allergic, and postpartum back pain.  Patient expressed understanding and wished to proceed.  All questions were answered.  Sterile technique used throughout procedure and epidural site dressed with sterile barrier dressing. No paresthesia or other complications noted. The patient did not experience any signs of intravascular injection such as tinnitus or metallic taste in mouth nor signs of intrathecal spread such as rapid motor block. Please see nursing notes for vital signs. Reason for block:procedure for pain

## 2017-06-07 NOTE — Anesthesia Preprocedure Evaluation (Signed)
Anesthesia Evaluation  Patient identified by MRN, date of birth, ID band Patient awake    Reviewed: Allergy & Precautions, NPO status , Patient's Chart, lab work & pertinent test results  Airway Mallampati: II  TM Distance: >3 FB Neck ROM: Full    Dental  (+) Teeth Intact, Dental Advisory Given, Partial Upper   Pulmonary neg pulmonary ROS,    Pulmonary exam normal breath sounds clear to auscultation       Cardiovascular negative cardio ROS Normal cardiovascular exam Rhythm:Regular Rate:Normal     Neuro/Psych  Headaches, PSYCHIATRIC DISORDERS Anxiety Depression    GI/Hepatic negative GI ROS, (+)     substance abuse (Subutex therapy)  , Hepatitis -, C  Endo/Other  Morbid obesity  Renal/GU negative Renal ROS     Musculoskeletal negative musculoskeletal ROS (+)   Abdominal   Peds  Hematology  (+) Blood dyscrasia, anemia , Plt 161k   Anesthesia Other Findings Day of surgery medications reviewed with the patient.  Reproductive/Obstetrics (+) Pregnancy                             Anesthesia Physical Anesthesia Plan  ASA: III  Anesthesia Plan: Epidural   Post-op Pain Management:    Induction:   PONV Risk Score and Plan: 2 and Treatment may vary due to age or medical condition  Airway Management Planned:   Additional Equipment:   Intra-op Plan:   Post-operative Plan:   Informed Consent: I have reviewed the patients History and Physical, chart, labs and discussed the procedure including the risks, benefits and alternatives for the proposed anesthesia with the patient or authorized representative who has indicated his/her understanding and acceptance.   Dental advisory given  Plan Discussed with:   Anesthesia Plan Comments: (Patient identified. Risks/Benefits/Options discussed with patient including but not limited to bleeding, infection, nerve damage, paralysis, failed block,  incomplete pain control, headache, blood pressure changes, nausea, vomiting, reactions to medication both or allergic, itching and postpartum back pain. Confirmed with bedside nurse the patient's most recent platelet count. Confirmed with patient that they are not currently taking any anticoagulation, have any bleeding history or any family history of bleeding disorders. Patient expressed understanding and wished to proceed. All questions were answered. )        Anesthesia Quick Evaluation

## 2017-06-07 NOTE — Progress Notes (Signed)
   Subjective: Pt seen as she is walking in halls. Currently on 2 mu of pitocin,. Contractions irrgular. +FM  No leakage of fluid   Objective: BP 128/73 (BP Location: Right Arm)   Pulse 73   Temp 98.6 F (37 C) (Oral)   Resp 16   Ht 5\' 4"  (1.626 m)   Wt 117 kg (258 lb)   LMP 09/06/2016   BMI 44.29 kg/m  No intake/output data recorded. No intake/output data recorded.  FHT:  FHR: 135 bpm, variability: moderate,  accelerations:  Present,  decelerations:  Absent UC: not graphing as well as pt is walking  SVE:   Dilation: 1.5 Effacement (%): 50 Station: -2 Exam by:: Doloris HallJenny Middleton RN  Labs: Lab Results  Component Value Date   WBC 12.8 (H) 06/07/2017   HGB 11.5 (L) 06/07/2017   HCT 33.9 (L) 06/07/2017   MCV 83.5 06/07/2017   PLT 161 06/07/2017    Assessment / Plan: Induction of labor due to SGA  Labor: Progressing normally Preeclampsia:  NA Fetal Wellbeing:  Category I Pain Control:  planning epidural  I/D:  Penicillin in active labor  Anticipated MOD:  NSVD  Abigail Fox J. 06/07/2017, 1:17 PM

## 2017-06-08 ENCOUNTER — Encounter (HOSPITAL_COMMUNITY)
Admission: RE | Disposition: A | Discharge status: Home / Self Care | Payer: Self-pay | Source: Ambulatory Visit | Attending: Obstetrics and Gynecology

## 2017-06-08 ENCOUNTER — Encounter (HOSPITAL_COMMUNITY): Payer: Self-pay

## 2017-06-08 LAB — CBC
HCT: 31.5 % — ABNORMAL LOW (ref 36.0–46.0)
Hemoglobin: 10.7 g/dL — ABNORMAL LOW (ref 12.0–15.0)
MCH: 28.6 pg (ref 26.0–34.0)
MCHC: 34 g/dL (ref 30.0–36.0)
MCV: 84.2 fL (ref 78.0–100.0)
PLATELETS: 138 10*3/uL — AB (ref 150–400)
RBC: 3.74 MIL/uL — AB (ref 3.87–5.11)
RDW: 14.1 % (ref 11.5–15.5)
WBC: 14.7 10*3/uL — AB (ref 4.0–10.5)

## 2017-06-08 LAB — COMPREHENSIVE METABOLIC PANEL
ALT: 46 U/L (ref 14–54)
AST: 42 U/L — ABNORMAL HIGH (ref 15–41)
Albumin: 2.4 g/dL — ABNORMAL LOW (ref 3.5–5.0)
Alkaline Phosphatase: 130 U/L — ABNORMAL HIGH (ref 38–126)
Anion gap: 8 (ref 5–15)
BUN: 7 mg/dL (ref 6–20)
CHLORIDE: 106 mmol/L (ref 101–111)
CO2: 22 mmol/L (ref 22–32)
Calcium: 8.4 mg/dL — ABNORMAL LOW (ref 8.9–10.3)
Creatinine, Ser: 0.81 mg/dL (ref 0.44–1.00)
GFR calc non Af Amer: >60 mL/min (ref >60)
Glucose, Bld: 107 mg/dL — ABNORMAL HIGH (ref 65–99)
Potassium: 4 mmol/L (ref 3.5–5.1)
SODIUM: 136 mmol/L (ref 135–145)
Total Bilirubin: 0.8 mg/dL (ref 0.3–1.2)
Total Protein: 5.8 g/dL — ABNORMAL LOW (ref 6.5–8.1)

## 2017-06-08 SURGERY — Surgical Case
Anesthesia: Epidural

## 2017-06-08 MED ORDER — NALBUPHINE HCL 10 MG/ML IJ SOLN: 5.0000 mg | Freq: Once | INTRAMUSCULAR | Status: DC | PRN

## 2017-06-08 MED ORDER — IBUPROFEN 600 MG PO TABS
600.0000 mg | ORAL_TABLET | Freq: Four times a day (QID) | ORAL | Status: DC
  Administered 2017-06-09: 600 mg via ORAL
  Filled 2017-06-08: qty 1

## 2017-06-08 MED ORDER — ACETAMINOPHEN 325 MG PO TABS
650.0000 mg | ORAL_TABLET | ORAL | Status: DC | PRN
  Administered 2017-06-10 (×3): 650 mg via ORAL
  Filled 2017-06-08 (×3): qty 2

## 2017-06-08 MED ORDER — DIPHENHYDRAMINE HCL 25 MG PO CAPS: 25.0000 mg | ORAL_CAPSULE | ORAL | Status: DC | PRN

## 2017-06-08 MED ORDER — LACTATED RINGERS IV SOLN
INTRAVENOUS | Status: DC | PRN
  Administered 2017-06-08 (×3): via INTRAVENOUS

## 2017-06-08 MED ORDER — SCOPOLAMINE 1 MG/3DAYS TD PT72
1.0000 | MEDICATED_PATCH | Freq: Once | TRANSDERMAL | Status: DC
  Filled 2017-06-08: qty 1

## 2017-06-08 MED ORDER — MEPERIDINE HCL 25 MG/ML IJ SOLN: 6.2500 mg | INTRAMUSCULAR | Status: DC | PRN

## 2017-06-08 MED ORDER — TRAMADOL HCL 50 MG PO TABS
50.0000 mg | ORAL_TABLET | Freq: Four times a day (QID) | ORAL | Status: DC | PRN
  Administered 2017-06-08 – 2017-06-10 (×6): 50 mg via ORAL
  Filled 2017-06-08 (×6): qty 1

## 2017-06-08 MED ORDER — METHYLERGONOVINE MALEATE 0.2 MG PO TABS: 0.2000 mg | ORAL_TABLET | ORAL | Status: DC | PRN

## 2017-06-08 MED ORDER — SCOPOLAMINE 1 MG/3DAYS TD PT72
MEDICATED_PATCH | TRANSDERMAL | Status: DC | PRN
  Administered 2017-06-08: 1 via TRANSDERMAL

## 2017-06-08 MED ORDER — ONDANSETRON HCL 4 MG/2ML IJ SOLN: 4.0000 mg | Freq: Three times a day (TID) | INTRAMUSCULAR | Status: DC | PRN

## 2017-06-08 MED ORDER — ENOXAPARIN SODIUM 40 MG/0.4ML ~~LOC~~ SOLN
40.0000 mg | SUBCUTANEOUS | Status: DC
  Administered 2017-06-09 – 2017-06-10 (×2): 40 mg via SUBCUTANEOUS
  Filled 2017-06-08 (×3): qty 0.4

## 2017-06-08 MED ORDER — NALOXONE HCL 4 MG/10ML IJ SOLN: 1.0000 ug/kg/h | INTRAMUSCULAR | Status: DC | PRN

## 2017-06-08 MED ORDER — OXYCODONE HCL 5 MG/5ML PO SOLN: 5.0000 mg | Freq: Once | ORAL | Status: DC | PRN

## 2017-06-08 MED ORDER — LIDOCAINE-EPINEPHRINE (PF) 2 %-1:200000 IJ SOLN
INTRAMUSCULAR | Status: AC
  Filled 2017-06-08: qty 20

## 2017-06-08 MED ORDER — PRENATAL MULTIVITAMIN CH
1.0000 | ORAL_TABLET | Freq: Every day | ORAL | Status: DC
  Administered 2017-06-10: 1 via ORAL
  Filled 2017-06-08: qty 1

## 2017-06-08 MED ORDER — SCOPOLAMINE 1 MG/3DAYS TD PT72
MEDICATED_PATCH | TRANSDERMAL | Status: AC
  Filled 2017-06-08: qty 1

## 2017-06-08 MED ORDER — COCONUT OIL OIL
1.0000 "application " | TOPICAL_OIL | Status: DC | PRN
  Filled 2017-06-08: qty 120

## 2017-06-08 MED ORDER — SODIUM BICARBONATE 8.4 % IV SOLN
INTRAVENOUS | Status: AC
  Filled 2017-06-08: qty 50

## 2017-06-08 MED ORDER — HYDROMORPHONE HCL 1 MG/ML IJ SOLN: 0.2500 mg | INTRAMUSCULAR | Status: DC | PRN

## 2017-06-08 MED ORDER — SODIUM CHLORIDE 0.9% FLUSH: 3.0000 mL | INTRAVENOUS | Status: DC | PRN

## 2017-06-08 MED ORDER — METHYLERGONOVINE MALEATE 0.2 MG/ML IJ SOLN: 0.2000 mg | INTRAMUSCULAR | Status: DC | PRN

## 2017-06-08 MED ORDER — SIMETHICONE 80 MG PO CHEW
80.0000 mg | CHEWABLE_TABLET | Freq: Three times a day (TID) | ORAL | Status: DC
  Administered 2017-06-08 – 2017-06-10 (×4): 80 mg via ORAL
  Filled 2017-06-08 (×5): qty 1

## 2017-06-08 MED ORDER — NALOXONE HCL 0.4 MG/ML IJ SOLN: 0.4000 mg | INTRAMUSCULAR | Status: DC | PRN

## 2017-06-08 MED ORDER — TETANUS-DIPHTH-ACELL PERTUSSIS 5-2.5-18.5 LF-MCG/0.5 IM SUSP: 0.5000 mL | Freq: Once | INTRAMUSCULAR | Status: DC

## 2017-06-08 MED ORDER — SIMETHICONE 80 MG PO CHEW: 80.0000 mg | CHEWABLE_TABLET | ORAL | Status: DC | PRN

## 2017-06-08 MED ORDER — KETOROLAC TROMETHAMINE 30 MG/ML IJ SOLN
INTRAMUSCULAR | Status: AC
  Filled 2017-06-08: qty 1

## 2017-06-08 MED ORDER — DEXAMETHASONE SODIUM PHOSPHATE 10 MG/ML IJ SOLN
INTRAMUSCULAR | Status: AC
  Filled 2017-06-08: qty 1

## 2017-06-08 MED ORDER — SENNOSIDES-DOCUSATE SODIUM 8.6-50 MG PO TABS
2.0000 | ORAL_TABLET | ORAL | Status: DC
  Administered 2017-06-09 – 2017-06-10 (×2): 2 via ORAL
  Filled 2017-06-08 (×3): qty 2

## 2017-06-08 MED ORDER — OXYCODONE HCL 5 MG PO TABS: 5.0000 mg | ORAL_TABLET | Freq: Once | ORAL | Status: DC | PRN

## 2017-06-08 MED ORDER — ACETAMINOPHEN 10 MG/ML IV SOLN
1000.0000 mg | Freq: Once | INTRAVENOUS | Status: AC
  Administered 2017-06-08: 1000 mg via INTRAVENOUS
  Filled 2017-06-08: qty 100

## 2017-06-08 MED ORDER — SIMETHICONE 80 MG PO CHEW
80.0000 mg | CHEWABLE_TABLET | ORAL | Status: DC
  Administered 2017-06-09 – 2017-06-10 (×2): 80 mg via ORAL
  Filled 2017-06-08 (×2): qty 1

## 2017-06-08 MED ORDER — OXYTOCIN 10 UNIT/ML IJ SOLN
INTRAMUSCULAR | Status: AC
  Filled 2017-06-08: qty 4

## 2017-06-08 MED ORDER — SODIUM BICARBONATE 8.4 % IV SOLN
INTRAVENOUS | Status: DC | PRN
  Administered 2017-06-08: 5 mL via EPIDURAL
  Administered 2017-06-08: 10 mL via EPIDURAL

## 2017-06-08 MED ORDER — ONDANSETRON HCL 4 MG/2ML IJ SOLN
INTRAMUSCULAR | Status: AC
  Filled 2017-06-08: qty 2

## 2017-06-08 MED ORDER — ZOLPIDEM TARTRATE 5 MG PO TABS: 5.0000 mg | ORAL_TABLET | Freq: Every evening | ORAL | Status: DC | PRN

## 2017-06-08 MED ORDER — KETOROLAC TROMETHAMINE 30 MG/ML IJ SOLN
30.0000 mg | Freq: Once | INTRAMUSCULAR | Status: DC | PRN
  Administered 2017-06-08: 30 mg via INTRAVENOUS

## 2017-06-08 MED ORDER — PROMETHAZINE HCL 25 MG/ML IJ SOLN: 6.2500 mg | INTRAMUSCULAR | Status: DC | PRN

## 2017-06-08 MED ORDER — OXYTOCIN 10 UNIT/ML IJ SOLN
INTRAVENOUS | Status: DC | PRN
  Administered 2017-06-08: 40 [IU] via INTRAVENOUS

## 2017-06-08 MED ORDER — OXYTOCIN 40 UNITS IN LACTATED RINGERS INFUSION - SIMPLE MED
1.0000 m[IU]/min | INTRAVENOUS | Status: DC
  Administered 2017-06-08: 2 m[IU]/min via INTRAVENOUS

## 2017-06-08 MED ORDER — MORPHINE SULFATE (PF) 0.5 MG/ML IJ SOLN
INTRAMUSCULAR | Status: DC | PRN
  Administered 2017-06-08: 3 mg via EPIDURAL

## 2017-06-08 MED ORDER — NALBUPHINE HCL 10 MG/ML IJ SOLN: 5.0000 mg | INTRAMUSCULAR | Status: DC | PRN

## 2017-06-08 MED ORDER — MENTHOL 3 MG MT LOZG: 1.0000 | LOZENGE | OROMUCOSAL | Status: DC | PRN

## 2017-06-08 MED ORDER — LACTATED RINGERS IV SOLN
INTRAVENOUS | Status: DC | PRN
  Administered 2017-06-08: 09:00:00 via INTRAVENOUS

## 2017-06-08 MED ORDER — BUPRENORPHINE HCL 8 MG SL SUBL
8.0000 mg | SUBLINGUAL_TABLET | Freq: Two times a day (BID) | SUBLINGUAL | Status: DC
  Administered 2017-06-08 – 2017-06-10 (×4): 8 mg via SUBLINGUAL
  Filled 2017-06-08 (×4): qty 1

## 2017-06-08 MED ORDER — DIPHENHYDRAMINE HCL 50 MG/ML IJ SOLN: 12.5000 mg | INTRAMUSCULAR | Status: DC | PRN

## 2017-06-08 MED ORDER — SODIUM CHLORIDE 0.9 % IR SOLN
Status: DC | PRN
  Administered 2017-06-08: 1

## 2017-06-08 MED ORDER — KETOROLAC TROMETHAMINE 30 MG/ML IJ SOLN
30.0000 mg | Freq: Four times a day (QID) | INTRAMUSCULAR | Status: DC
  Administered 2017-06-08 – 2017-06-09 (×3): 30 mg via INTRAVENOUS
  Filled 2017-06-08 (×3): qty 1

## 2017-06-08 MED ORDER — CEFAZOLIN SODIUM-DEXTROSE 2-4 GM/100ML-% IV SOLN
INTRAVENOUS | Status: AC
  Filled 2017-06-08: qty 100

## 2017-06-08 MED ORDER — DEXAMETHASONE SODIUM PHOSPHATE 10 MG/ML IJ SOLN
INTRAMUSCULAR | Status: DC | PRN
  Administered 2017-06-08: 10 mg via INTRAVENOUS

## 2017-06-08 MED ORDER — FERROUS SULFATE 325 (65 FE) MG PO TABS
325.0000 mg | ORAL_TABLET | Freq: Two times a day (BID) | ORAL | Status: DC
  Administered 2017-06-09 – 2017-06-10 (×3): 325 mg via ORAL
  Filled 2017-06-08 (×3): qty 1

## 2017-06-08 MED ORDER — MORPHINE SULFATE (PF) 0.5 MG/ML IJ SOLN
INTRAMUSCULAR | Status: AC
  Filled 2017-06-08: qty 10

## 2017-06-08 MED ORDER — CEFAZOLIN SODIUM-DEXTROSE 2-3 GM-%(50ML) IV SOLR
INTRAVENOUS | Status: DC | PRN
  Administered 2017-06-08: 2 g via INTRAVENOUS

## 2017-06-08 MED ORDER — OXYTOCIN 40 UNITS IN LACTATED RINGERS INFUSION - SIMPLE MED: 2.5000 [IU]/h | INTRAVENOUS | Status: AC

## 2017-06-08 MED ORDER — WITCH HAZEL-GLYCERIN EX PADS: 1.0000 "application " | MEDICATED_PAD | CUTANEOUS | Status: DC | PRN

## 2017-06-08 MED ORDER — ONDANSETRON HCL 4 MG/2ML IJ SOLN
INTRAMUSCULAR | Status: DC | PRN
  Administered 2017-06-08: 4 mg via INTRAVENOUS

## 2017-06-08 MED ORDER — LACTATED RINGERS IV SOLN
INTRAVENOUS | Status: DC
  Administered 2017-06-08: 13:00:00 via INTRAVENOUS

## 2017-06-08 MED ORDER — DIBUCAINE 1 % RE OINT
1.0000 | TOPICAL_OINTMENT | RECTAL | Status: DC | PRN
Start: 2017-06-08 — End: 2017-06-10

## 2017-06-08 MED ORDER — DIPHENHYDRAMINE HCL 25 MG PO CAPS: 25.0000 mg | ORAL_CAPSULE | Freq: Four times a day (QID) | ORAL | Status: DC | PRN

## 2017-06-08 SURGICAL SUPPLY — 40 items
BARRIER ADHS 3X4 INTERCEED (GAUZE/BANDAGES/DRESSINGS) ×6 IMPLANT
BENZOIN TINCTURE PRP APPL 2/3 (GAUZE/BANDAGES/DRESSINGS) IMPLANT
CHLORAPREP W/TINT 26ML (MISCELLANEOUS) ×3 IMPLANT
CLAMP CORD UMBIL (MISCELLANEOUS) IMPLANT
CLOSURE WOUND 1/2 X4 (GAUZE/BANDAGES/DRESSINGS)
CLOTH BEACON ORANGE TIMEOUT ST (SAFETY) ×3 IMPLANT
DERMABOND ADVANCED (GAUZE/BANDAGES/DRESSINGS)
DERMABOND ADVANCED .7 DNX12 (GAUZE/BANDAGES/DRESSINGS) IMPLANT
DRESSING DISP NPWT PICO 4X12 (MISCELLANEOUS) ×3 IMPLANT
DRSG OPSITE POSTOP 4X10 (GAUZE/BANDAGES/DRESSINGS) ×3 IMPLANT
ELECT REM PT RETURN 9FT ADLT (ELECTROSURGICAL) ×3
ELECTRODE REM PT RTRN 9FT ADLT (ELECTROSURGICAL) ×1 IMPLANT
EXTRACTOR VACUUM BELL STYLE (SUCTIONS) IMPLANT
GLOVE BIO SURGEON STRL SZ7 (GLOVE) ×3 IMPLANT
GLOVE BIOGEL PI IND STRL 7.0 (GLOVE) ×2 IMPLANT
GLOVE BIOGEL PI INDICATOR 7.0 (GLOVE) ×4
GOWN STRL REUS W/TWL LRG LVL3 (GOWN DISPOSABLE) ×6 IMPLANT
KIT ABG SYR 3ML LUER SLIP (SYRINGE) IMPLANT
NEEDLE HYPO 25X5/8 SAFETYGLIDE (NEEDLE) IMPLANT
NS IRRIG 1000ML POUR BTL (IV SOLUTION) ×3 IMPLANT
PACK C SECTION WH (CUSTOM PROCEDURE TRAY) ×3 IMPLANT
PAD OB MATERNITY 4.3X12.25 (PERSONAL CARE ITEMS) ×3 IMPLANT
PENCIL SMOKE EVAC W/HOLSTER (ELECTROSURGICAL) ×3 IMPLANT
RETRACTOR TRAXI PANNICULUS (MISCELLANEOUS) ×1 IMPLANT
RTRCTR C-SECT PINK 25CM LRG (MISCELLANEOUS) ×6 IMPLANT
SPONGE LAP 18X18 RF (DISPOSABLE) ×3 IMPLANT
STRIP CLOSURE SKIN 1/2X4 (GAUZE/BANDAGES/DRESSINGS) IMPLANT
SUT CHROMIC 0 CTX 36 (SUTURE) IMPLANT
SUT MON AB 4-0 PS1 27 (SUTURE) ×3 IMPLANT
SUT PLAIN 0 NONE (SUTURE) IMPLANT
SUT PLAIN 2 0 XLH (SUTURE) ×3 IMPLANT
SUT VIC AB 0 CT1 27 (SUTURE) ×2
SUT VIC AB 0 CT1 27XBRD ANBCTR (SUTURE) ×1 IMPLANT
SUT VIC AB 0 CTX 36 (SUTURE) ×10
SUT VIC AB 0 CTX36XBRD ANBCTRL (SUTURE) ×5 IMPLANT
SUT VIC AB 2-0 CT1 27 (SUTURE) ×2
SUT VIC AB 2-0 CT1 TAPERPNT 27 (SUTURE) ×1 IMPLANT
TOWEL OR 17X24 6PK STRL BLUE (TOWEL DISPOSABLE) ×3 IMPLANT
TRAXI PANNICULUS RETRACTOR (MISCELLANEOUS) ×2
TRAY FOLEY BAG SILVER LF 14FR (SET/KITS/TRAYS/PACK) IMPLANT

## 2017-06-08 NOTE — Op Note (Signed)
Abigail Fox              ACCOUNT NO.:  [id number]  MEDICAL RECORD NO.:  [id number]  LOCATION:  9164                          FACILITY:  [facility]  PHYSICIAN:  Pieter Partridge, MD   DATE OF BIRTH:  23-Feb-1986  DATE OF PROCEDURE:  06/08/2017 DATE OF DISCHARGE:                              OPERATIVE REPORT   PREOPERATIVE DIAGNOSES:  Primary C-section, non-reassuring fetal tracing, remote from delivery.  POSTOPERATIVE DIAGNOSES:  Primary C-section, non-reassuring fetal tracing, remote from delivery.  PROCEDURE:  Primary low transverse cesarean section with two-layer closure.  SURGEON:  Pieter Partridge, MD.  ASSISTANT:  Technician.  ANESTHESIA:  Epidural.  ESTIMATED BLOOD LOSS:  780 mL.  DRAINS:  Foley catheter.  SPECIMEN:  Placenta.  DISPOSITION OF SPECIMEN:  To pathology.  PATIENT DISPOSITION:  To PACU, hemodynamically stable.  FINDINGS:  Viable female infant in the vertex position.  Meconium- stained fluid.  Nuchal cord x1.  Apgars 9 and 9.  Normal fallopian tubes, ovaries, and uterus.  INDICATIONS:  Abigail Fox was admitted for induction of labor at 39 weeks due to SGA history of IUFD.  The patient was given Cytotec and Pitocin. Throughout her labor course, she had hyperstimulation, which resolved with discontinuation of the Pitocin.  The Pitocin was resumed.  She would have late D cells, variable D cells and she remained at 5 cm, and the patient did not progress somewhat fetal intolerance to labor.  Once the Pitocin was stopped, variability returned and tracing was more reassuring.  The patient was consented for primary C-section.  Risks, benefits, alternatives reviewed.  The patient was taken to the operating room with IV running.  Her epidural anesthesia was optimized.  She was prepped and draped in a normal sterile fashion.  A time-out was performed.  SCDs were on her legs and operating.  Ancef 2 g IV was given prior to incision.  Allis was used to test for  adequate anesthesia.  The patient was draped.  Abdomen was marked.  A Pfannenstiel skin incision made with a knife and carried down to the underlying layer of the fascia with Bovie.  Fascia was incised at the midline and incision was extended laterally.  Fascia was dissected off the rectus muscles sharply.  Rectus muscles were separated at the midline.  Peritoneum entered sharply.  Stretched intraabdominal palpation revealed no adhesions.  Alexis retractor was placed.  The lower uterine segment was identified.  The bladder reflection was not well visualized.  A low-transverse cesarean section was made with the scalpel and skin with the bandage scissors.  Head was brought to the incision easily and delivered atraumatically.  Nuchal cord easily reduced.  Baby delivered.  Nose and mouth were suctioned.  Cord was clamped x2 and cut and baby was handed off to the awaiting NICU team.  Placenta was removed.  Uterus was cleared of all clots and debris with a moist laparotomy sponge.  The edges were tagged for hemostasis with the ring forceps.  First layer was closed.  On the right apex, there was an extension of the incision that was closed with 0 Vicryl and then the remainder of the hysterotomy incision was closed.  A second  layer for imbrication was performed and several interrupted sutures for hemostasis.  There was a moist laparotomy sponge put in the abdomen to displace the intestines.  The irrigation was performed.  Interceed was applied.  The peritoneum was then closed with 2-0 Vicryl.  Fascia was closed with 0 Vicryl.  Subcutaneous space was closed with 2-0 plain gut.  Skin was reapproximated with 4-0 Monocryl in a subcuticular stitch.  PICO dressing was attached.  All instrument, sponge, and needle counts were correct x3.  The patient was taken to the recovery room in stable condition.     Pieter PartridgeEvelyn B Aerial Dilley, MD     EBV/MEDQ  D:  06/08/2017  T:  06/08/2017  Job:  [id number]

## 2017-06-08 NOTE — Anesthesia Postprocedure Evaluation (Signed)
Anesthesia Post Note  Patient: Abigail AcostaChelsea Abdo  Procedure(s) Performed: CESAREAN SECTION (N/A )     Patient location during evaluation: PACU Anesthesia Type: Epidural Level of consciousness: awake and alert Pain management: pain level controlled Vital Signs Assessment: post-procedure vital signs reviewed and stable Respiratory status: spontaneous breathing, nonlabored ventilation and respiratory function stable Cardiovascular status: stable Postop Assessment: no headache, no backache and epidural receding Anesthetic complications: no    Last Vitals:  Vitals:   06/08/17 1214 06/08/17 1330  BP: 134/73 (!) 122/58  Pulse: 60 61  Resp:  16  Temp: 36.5 C 37.1 C  SpO2: 98% 98%    Last Pain:  Vitals:   06/08/17 1330  TempSrc: Oral  PainSc: 5    Pain Goal: Patients Stated Pain Goal: 5 (06/07/17 2100)               Lewie LoronJohn Necie Wilcoxson

## 2017-06-08 NOTE — Transfer of Care (Signed)
Immediate Anesthesia Transfer of Care Note  Patient: Abigail AcostaChelsea Fox  Procedure(s) Performed: CESAREAN SECTION (N/A )  Patient Location: PACU  Anesthesia Type:Epidural  Level of Consciousness: awake, alert  and oriented  Airway & Oxygen Therapy: Patient Spontanous Breathing  Post-op Assessment: Report given to RN and Post -op Vital signs reviewed and stable  Post vital signs: Reviewed and stable  Last Vitals:  Vitals:   06/08/17 0805 06/08/17 0831  BP: (!) 95/51 (!) 110/55  Pulse: 70 63  Resp: 18   Temp:    SpO2:      Last Pain:  Vitals:   06/08/17 0740  TempSrc:   PainSc: 8       Patients Stated Pain Goal: 5 (06/07/17 2100)  Complications: No apparent anesthesia complications

## 2017-06-08 NOTE — Lactation Note (Signed)
This note was copied from a baby's chart. Lactation Consultation Note  Patient Name: Girl Staci AcostaChelsea Bastone Today's Date: 06/08/2017 Reason for consult: Initial assessment;1st time breastfeeding;Primapara;Term;Infant < 6lbs;Difficult latch  11 hours old SGA female who is being exclusively BF by her mother. RN called because baby was having difficulty latching on. Assisted mom with latch but baby wouldn't latch on, taught mom how to hand express and got a couple of drops out of left breast and tried to latch baby on but no sucking noticed. LC did some suck training, baby has intermittent episodes of suckling and biting, but she's cueing.  Set mom up with a DEBP, reviewed assembly, cleaning and milk storage. Dad was extremely helpful doing hand expression and latching baby at the breast, he actually got some colostrum droplets and he fed those back to baby with finger feeding. Praised mom and dad for their efforts.   Mom's breasts and nipples are intact with no signs of trauma. Instructed mom to let her nurse know in case she get sore nipples or see any cracking of the skin. Discussed Hepatitis C and BF, mom is aware that if she has bleeding nipples or open wounds she should let her RN know. RN is also aware that mom will have to pump and dump on that breast until it heals; mom verbalized understanding of all this information.  Encouraged mom to feed baby every 3 hours and do some hand expression and pumping after baby feeds or attempts to feed. Mom will use coconut oil prior pumping to preserve the integrity of the skin in her nipples and avoid cracks/broken skin. Mom and dad will use fresh expressed colostrum to feed it back to baby. Reviewed caring for your LPI infant, BF brochure, BF resources and feeding diary, mom is aware of LC services and will call PRN.  Maternal Data Formula Feeding for Exclusion: No Has patient been taught Hand Expression?: Yes Does the patient have breastfeeding experience  prior to this delivery?: No  Feeding Feeding Type: Breast Milk  Interventions Interventions: Breast feeding basics reviewed;Assisted with latch;Skin to skin;Breast massage;Breast compression;Hand express;Adjust position;Support pillows;Position options;Expressed milk;Coconut oil;DEBP  Lactation Tools Discussed/Used WIC Program: No Pump Review: Setup, frequency, and cleaning;Milk Storage Initiated by:: MPeck Date initiated:: 06/08/17   Consult Status Consult Status: Follow-up Date: 06/09/17 Follow-up type: In-patient    Alfonsa Vaile Venetia ConstableS Jakari Sada 06/08/2017, 8:50 PM

## 2017-06-08 NOTE — Progress Notes (Signed)
Abigail AcostaChelsea Fox is a 32 y.o. G3P0020 at 8051w2d  Pt with hyperstimulation, deceleration then tachycardia.  Tracing improved, contractions spaced out and pattern became less dysfunctional.   Subjective: Pt with c/o pressure despite epidural. Objective: BP (!) 153/97   Pulse 67   Temp 97.8 F (36.6 C) (Oral)   Resp 16   Ht 5\' 4"  (1.626 m)   Wt 117 kg (258 lb)   LMP 09/06/2016   SpO2 100%   BMI 44.29 kg/m  I/O last 3 completed shifts: In: -  Out: 750 [Urine:750] Total I/O In: -  Out: 500 [Urine:500]  FHT:  Decreased variabilty., accelerations, positive fetal scalp stiumulation UC:   regular, every 2 minutes SVE:   Dilation: 5.5 Effacement (%): 90 Station: -1 Exam by:: C Parker HannifinSullivan RN  Labs: Lab Results  Component Value Date   WBC 12.8 (H) 06/07/2017   HGB 11.5 (L) 06/07/2017   HCT 33.9 (L) 06/07/2017   MCV 83.5 06/07/2017   PLT 161 06/07/2017    Assessment / Plan: IUP @ 39 1/7 weeks  Labor: Hyperstimulation resolved.  Observe.  Restart contractions if MVUs below 180 Preeclampsia:  BP normal Fetal Wellbeing:  Category II currently overall reassuring Pain Control:  Epidural I/D:  PCN for GBS prophylaxis Anticipated MOD:  NSVD  Abigail Fox 06/08/2017, 4:00 AM

## 2017-06-08 NOTE — Progress Notes (Signed)
Informed by night RN that pt had early decelerations but she was repositioned and they became early decelerations.  Oncoming RN felt pt had repetitive late decelerations.  Cervix was 5-6, 0 station.  Inadequate MVUs. Order given to discontinue Pitocin, bolus.  Upon arrival, tracing improving.  Variability improving before transfer to OR.  Recommended cesarean section due to nonreassuring fetal tracing remote from delivery.  R/B/A reviewed.  Questions answered.

## 2017-06-08 NOTE — Brief Op Note (Signed)
06/07/2017 - 06/08/2017  10:31 AM  PATIENT:  Abigail Acostahelsea Needle  32 y.o. female  PRE-OPERATIVE DIAGNOSIS:  primary cesarean section, nonreassuring fetal tracing remote from delivery  POST-OPERATIVE DIAGNOSIS:  primary cesarean section, nonreassuring fetal tracing remote from delivery  PROCEDURE:  Procedure(s): CESAREAN SECTION (N/A), primary LTCS, 2 layer closure  SURGEON:  Surgeon(s) and Role:    Geryl Rankins* Tytus Strahle, MD - Primary  PHYSICIAN ASSISTANT:   ASSISTANTS: Technician   ANESTHESIA:   epidural  EBL:  780 mL   BLOOD ADMINISTERED:none  DRAINS: Urinary Catheter (Foley)   LOCAL MEDICATIONS USED:  NONE  SPECIMEN:  Source of Specimen:  Placenta  DISPOSITION OF SPECIMEN:  PATHOLOGY  COUNTS:  YES  TOURNIQUET:  * No tourniquets in log *  DICTATION: .Other Dictation: Dictation Number [id number]  PLAN OF CARE: Transfer to Postpartum  PATIENT DISPOSITION:  PACU - hemodynamically stable.   Delay start of Pharmacological VTE agent (>24hrs) due to surgical blood loss or risk of bleeding: yes

## 2017-06-09 LAB — CBC
HCT: 24.5 % — ABNORMAL LOW (ref 36.0–46.0)
Hemoglobin: 8.6 g/dL — ABNORMAL LOW (ref 12.0–15.0)
MCH: 29.1 pg (ref 26.0–34.0)
MCHC: 35.1 g/dL (ref 30.0–36.0)
MCV: 82.8 fL (ref 78.0–100.0)
PLATELETS: 114 10*3/uL — AB (ref 150–400)
RBC: 2.96 MIL/uL — ABNORMAL LOW (ref 3.87–5.11)
RDW: 14.2 % (ref 11.5–15.5)
WBC: 13.8 10*3/uL — ABNORMAL HIGH (ref 4.0–10.5)

## 2017-06-09 MED ORDER — KETOROLAC TROMETHAMINE 30 MG/ML IJ SOLN: 30.0000 mg | Freq: Four times a day (QID) | INTRAMUSCULAR | Status: DC

## 2017-06-09 MED ORDER — KETOROLAC TROMETHAMINE 10 MG PO TABS
10.0000 mg | ORAL_TABLET | Freq: Four times a day (QID) | ORAL | Status: DC | PRN
  Administered 2017-06-09 – 2017-06-10 (×3): 10 mg via ORAL
  Filled 2017-06-09 (×5): qty 1

## 2017-06-09 MED ORDER — KETOROLAC TROMETHAMINE 30 MG/ML IJ SOLN
30.0000 mg | Freq: Four times a day (QID) | INTRAMUSCULAR | Status: DC | PRN
  Administered 2017-06-10: 30 mg via INTRAMUSCULAR
  Filled 2017-06-09: qty 1
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
## 2017-06-09 NOTE — Anesthesia Postprocedure Evaluation (Signed)
Anesthesia Post Note  Patient: Staci AcostaChelsea Forry  Procedure(s) Performed: CESAREAN SECTION (N/A )     Patient location during evaluation: Mother Baby Anesthesia Type: Epidural Level of consciousness: awake and alert and oriented Pain management: pain level controlled Vital Signs Assessment: post-procedure vital signs reviewed and stable Respiratory status: spontaneous breathing and nonlabored ventilation Cardiovascular status: stable Postop Assessment: no headache, no backache, patient able to bend at knees, no signs of nausea or vomiting and adequate PO intake Anesthetic complications: no    Last Vitals:  Vitals:   06/09/17 0500 06/09/17 0827  BP: 112/65 (!) 113/58  Pulse: 71 72  Resp: 18 18  Temp: 36.5 C 36.9 C  SpO2:  98%    Last Pain:  Vitals:   06/09/17 0827  TempSrc: Oral  PainSc: 2    Pain Goal: Patients Stated Pain Goal: 5 (06/09/17 0827)               Madison HickmanGREGORY,Darek Eifler

## 2017-06-09 NOTE — Progress Notes (Signed)
Subjective: Postop Day 1: Cesarean Delivery No complaints.  Pain controlled but has sharp pains with ambulation.  Lochia normal.  Breast feeding yes. +Ambulation in halls.  Objective: Temp:  [97.7 F (36.5 C)-98.7 F (37.1 C)] 98.4 F (36.9 C) (03/04 0827) Pulse Rate:  [59-72] 72 (03/04 0827) Resp:  [16-18] 18 (03/04 0827) BP: (103-124)/(56-72) 113/58 (03/04 0827) SpO2:  [97 %-99 %] 98 % (03/04 0827)  Physical Exam: Gen: NAD Lochia: Not visualized Uterine Fundus: firm, appropriately tender Incision: PICO dressing in place DVT Evaluation: +Edema present, no calf tenderness bilaterally   Recent Labs    06/08/17 1301 06/09/17 0505  HGB 10.7* 8.6*  HCT 31.5* 24.5*    Assessment/Plan: Status post C-section-doing well postoperatively. 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  Toradol IV x 3 doses overnight.  IV removed so pt will not get the remaining 2 doses.  Discontinue Motrin and start Toradol 10 mg po.  Ultram for breakthrough pain. Encouraged ambulation in halls. Routine prenatal care. Lactation support. Anticipate discharge POD #3.     Abigail Fox 06/09/2017, 1:04 PM

## 2017-06-09 NOTE — Lactation Note (Signed)
This note was copied from a baby's chart. Lactation Consultation Note  Patient Name: Abigail Staci AcostaChelsea Skorupski WRUEA'VToday's Date: 06/09/2017 Reason for consult: Follow-up assessment;Difficult latch;Term;1st time breastfeeding;Primapara;Infant < 6lbs  History of substance abuse (opiods, THC)  Mom currently on Subutex.  Visited with P1 Mom, baby 7037 hrs old.  Baby at 3% weight loss, and slight jaundice.  Baby 5 lbs 8.5 oz.  Mom has had difficult time latching baby to the breast.  She has been hand expressing and spoon or bottle feeding her EBM.  Mom has been able to express between .5 -1 ml, with one time she fed baby 5 ml EBM by bottle.  Offered to assist with positioning and latching.  Mom has very large, heavy breasts, with semi-flat nipples.  With hand pump, nipples erect some.   Mom can not see when baby latches due to her large breasts.  Mom continually pulling breast away from baby in order to see.  Positioned baby on pillows and encouraged Mom to relax.  Tried without a nipple shield, and with sandwiching of breast, baby able to attain a deep latch for a couple minutes, Identified swallows while baby on the breast without shield.    Baby tongue sucking.  Demonstrated how to suck train.  Initiated a 20 mm nipples shield, but switched to a 16 mm for a better fit.  Baby opens her mouth, but thrusts her tongue out and pushes nipple out of her mouth. Instilled 2 ml of colostrum into shield. Baby did eventually latch and settle into a pattern of sucking.  A few swallows audible.    Assisted Mom to double pump on initiation setting.  5 ml of colostrum expressed.   Fed baby 5 ml colostrum and 10 ml of formula by slow flow nipple.  Explained to Mom that baby would need more as she is into her second day of life.  Plan- 1- Keep baby STS, and offer breast on cue, or after 3 hrs. 2- Use nipple shield (16 mm) to help baby attain a deeper latch to breast.  Use EBM in nipple shield to entice baby. 3- Supplement baby  with 10-20 ml of EBM+/formula as needed. 4- Pump both breasts 15-20 mins.  Breast massage and hand expression as well. 5- Ask for assistance as needed.   Consult Status Consult Status: Follow-up Date: 06/10/17 Follow-up type: In-patient    Judee ClaraSmith, Anatole Apollo E 06/09/2017, 11:27 PM

## 2017-06-09 NOTE — Addendum Note (Signed)
Addendum  created 06/09/17 0917 by Shanon PayorGregory, Anabell Swint M, CRNA   Sign clinical note

## 2017-06-10 ENCOUNTER — Encounter (HOSPITAL_COMMUNITY): Payer: Self-pay

## 2017-06-10 MED ORDER — POLYSACCHARIDE IRON COMPLEX 150 MG PO CAPS: 150.0000 mg | ORAL_CAPSULE | Freq: Every day | ORAL | 2 refills | Status: DC

## 2017-06-10 MED ORDER — IBUPROFEN 800 MG PO TABS: 800.0000 mg | ORAL_TABLET | Freq: Three times a day (TID) | ORAL | 0 refills | Status: DC | PRN

## 2017-06-10 MED ORDER — TRAMADOL HCL 50 MG PO TABS: 50.0000 mg | ORAL_TABLET | Freq: Four times a day (QID) | ORAL | 0 refills | Status: DC | PRN

## 2017-06-10 NOTE — Discharge Instructions (Signed)
Cesarean Delivery, Care After °Refer to this sheet in the next few weeks. These instructions provide you with information about caring for yourself after your procedure. Your health care provider may also give you more specific instructions. Your treatment has been planned according to current medical practices, but problems sometimes occur. Call your health care provider if you have any problems or questions after your procedure. °What can I expect after the procedure? °After the procedure, it is common to have: °· A small amount of blood or clear fluid coming from the incision. °· Some redness, swelling, and pain in your incision area. °· Some abdominal pain and soreness. °· Vaginal bleeding (lochia). °· Pelvic cramps. °· Fatigue. ° °Follow these instructions at home: °Incision care ° °· Follow instructions from your health care provider about how to take care of your incision. Make sure you: °? Wash your hands with soap and water before you change your bandage (dressing). If soap and water are not available, use hand sanitizer. °? If you have a dressing, change it as told by your health care provider. °? Leave stitches (sutures), skin staples, skin glue, or adhesive strips in place. These skin closures may need to stay in place for 2 weeks or longer. If adhesive strip edges start to loosen and curl up, you may trim the loose edges. Do not remove adhesive strips completely unless your health care provider tells you to do that. °· Check your incision area every day for signs of infection. Check for: °? More redness, swelling, or pain. °? More fluid or blood. °? Warmth. °? Pus or a bad smell. °· When you cough or sneeze, hug a pillow. This helps with pain and decreases the chance of your incision opening up (dehiscing). Do this until your incision heals. °Medicines °· Take over-the-counter and prescription medicines only as told by your health care provider. °· If you were prescribed an antibiotic medicine, take it  as told by your health care provider. Do not stop taking the antibiotic until it is finished. °Driving °· Do not drive or operate heavy machinery while taking prescription pain medicine. °Lifestyle °· Do not drink alcohol. This is especially important if you are breastfeeding or taking pain medicine. °· Do not use tobacco products, including cigarettes, chewing tobacco, or e-cigarettes. If you need help quitting, ask your health care provider. Tobacco can delay wound healing. °Eating and drinking °· Drink at least 8 eight-ounce glasses of water every day unless told not to by your health care provider. If you breastfeed, you may need to drink more water than this. °· Eat high-fiber foods every day. These foods may help prevent or relieve constipation. High-fiber foods include: °? Whole grain cereals and breads. °? Brown rice. °? Beans. °? Fresh fruits and vegetables. °Activity °· Return to your normal activities as told by your health care provider. Ask your health care provider what activities are safe for you. °· Rest as much as possible. Try to rest or take a nap while your baby is sleeping. °· Do not lift anything that is heavier than your baby or 10 lb (4.5 kg) as told by your health care provider. °· Ask your health care provider when you can engage in sexual activity. This may depend on your: °? Risk of infection. °? Healing rate. °? Comfort and desire to engage in sexual activity. °Bathing °· Do not take baths, swim, or use a hot tub until your health care provider approves. Ask your health care provider if   you can take showers. You may only be allowed to take sponge baths until your incision heals. °General instructions °· Do not use tampons or douches until your health care provider approves. °· Wear: °? Loose, comfortable clothing. °? A supportive and well-fitting bra. °· Watch for any blood clots that may pass from your vagina. These may look like clumps of dark red, brown, or black discharge. °· Keep  your perineum clean and dry as told by your health care provider. °· Wipe from front to back when you use the toilet. °· If possible, have someone help you care for your baby and help with household activities for a few days after you leave the hospital. °· Keep all follow-up visits for you and your baby as told by your health care provider. This is important. °Contact a health care provider if: °· You have: °? Bad-smelling vaginal discharge. °? Difficulty urinating. °? Pain when urinating. °? A sudden increase or decrease in the frequency of your bowel movements. °? More redness, swelling, or pain around your incision. °? More fluid or blood coming from your incision. °? Pus or a bad smell coming from your incision. °? A fever. °? A rash. °? Little or no interest in activities you used to enjoy. °? Questions about caring for yourself or your baby. °? Nausea. °· Your incision feels warm to the touch. °· Your breasts turn red or become painful or hard. °· You feel unusually sad or worried. °· You vomit. °· You pass large blood clots from your vagina. If you pass a blood clot, save it to show to your health care provider. Do not flush blood clots down the toilet without showing your health care provider. °· You urinate more than usual. °· You are dizzy or light-headed. °· You have not breastfed and have not had a menstrual period for 12 weeks after delivery. °· You stopped breastfeeding and have not had a menstrual period for 12 weeks after stopping breastfeeding. °Get help right away if: °· You have: °? Pain that does not go away or get better with medicine. °? Chest pain. °? Difficulty breathing. °? Blurred vision or spots in your vision. °? Thoughts about hurting yourself or your baby. °? New pain in your abdomen or in one of your legs. °? A severe headache. °· You faint. °· You bleed from your vagina so much that you fill two sanitary pads in one hour. °This information is not intended to replace advice given to  you by your health care provider. Make sure you discuss any questions you have with your health care provider. °Document Released: 12/15/2001 Document Revised: 04/27/2016 Document Reviewed: 02/27/2015 °Elsevier Interactive Patient Education © 2018 [employer] ° ° °Postpartum Depression and Baby Blues °The postpartum period begins right after the birth of a baby. During this time, there is often a great amount of joy and excitement. It is also a time of many changes in the life of the parents. Regardless of how many times a mother gives birth, each child brings new challenges and dynamics to the family. It is not unusual to have feelings of excitement along with confusing shifts in moods, emotions, and thoughts. All mothers are at risk of developing postpartum depression or the "baby blues." These mood changes can occur right after giving birth, or they may occur many months after giving birth. The baby blues or postpartum depression can be mild or severe. Additionally, postpartum depression can go away rather quickly, or it can   be a long-term condition. °What are the causes? °Raised hormone levels and the rapid drop in those levels are thought to be a main cause of postpartum depression and the baby blues. A number of hormones change during and after pregnancy. Estrogen and progesterone usually decrease right after the delivery of your baby. The levels of thyroid hormone and various cortisol steroids also rapidly drop. Other factors that play a role in these mood changes include major life events and genetics. °What increases the risk? °If you have any of the following risks for the baby blues or postpartum depression, know what symptoms to watch out for during the postpartum period. Risk factors that may increase the likelihood of getting the baby blues or postpartum depression include: °· Having a personal or family history of depression. °· Having depression while being pregnant. °· Having premenstrual mood  issues or mood issues related to oral contraceptives. °· Having a lot of life stress. °· Having marital conflict. °· Lacking a social support network. °· Having a baby with special needs. °· Having health problems, such as diabetes. ° °What are the signs or symptoms? °Symptoms of baby blues include: °· Brief changes in mood, such as going from extreme happiness to sadness. °· Decreased concentration. °· Difficulty sleeping. °· Crying spells, tearfulness. °· Irritability. °· Anxiety. ° °Symptoms of postpartum depression typically begin within the first month after giving birth. These symptoms include: °· Difficulty sleeping or excessive sleepiness. °· Marked weight loss. °· Agitation. °· Feelings of worthlessness. °· Lack of interest in activity or food. ° °Postpartum psychosis is a very serious condition and can be dangerous. Fortunately, it is rare. Displaying any of the following symptoms is cause for immediate medical attention. Symptoms of postpartum psychosis include: °· Hallucinations and delusions. °· Bizarre or disorganized behavior. °· Confusion or disorientation. ° °How is this diagnosed? °A diagnosis is made by an evaluation of your symptoms. There are no medical or lab tests that lead to a diagnosis, but there are various questionnaires that a health care provider may use to identify those with the baby blues, postpartum depression, or psychosis. Often, a screening tool called the Edinburgh Postnatal Depression Scale is used to diagnose depression in the postpartum period. °How is this treated? °The baby blues usually goes away on its own in 1-2 weeks. Social support is often all that is needed. You will be encouraged to get adequate sleep and rest. Occasionally, you may be given medicines to help you sleep. °Postpartum depression requires treatment because it can last several months or longer if it is not treated. Treatment may include individual or group therapy, medicine, or both to address any  social, physiological, and psychological factors that may play a role in the depression. Regular exercise, a healthy diet, rest, and social support may also be strongly recommended. °Postpartum psychosis is more serious and needs treatment right away. Hospitalization is often needed. °Follow these instructions at home: °· Get as much rest as you can. Nap when the baby sleeps. °· Exercise regularly. Some women find yoga and walking to be beneficial. °· Eat a balanced and nourishing diet. °· Do little things that you enjoy. Have a cup of tea, take a bubble bath, read your favorite magazine, or listen to your favorite music. °· Avoid alcohol. °· Ask for help with household chores, cooking, grocery shopping, or running errands as needed. Do not try to do everything. °· Talk to people close to you about how you are feeling. Get support from your   partner, family members, friends, or other new moms. °· Try to stay positive in how you think. Think about the things you are grateful for. °· Do not spend a lot of time alone. °· Only take over-the-counter or prescription medicine as directed by your health care provider. °· Keep all your postpartum appointments. °· Let your health care provider know if you have any concerns. °Contact a health care provider if: °You are having a reaction to or problems with your medicine. °Get help right away if: °· You have suicidal feelings. °· You think you may harm the baby or someone else. °This information is not intended to replace advice given to you by your health care provider. Make sure you discuss any questions you have with your health care provider. °Document Released: 12/28/2003 Document Revised: 08/31/2015 Document Reviewed: 01/04/2013 °Elsevier Interactive Patient Education © 2017 [employer]vier Inc. ° °

## 2017-06-10 NOTE — Lactation Note (Signed)
This note was copied from a baby's chart. Lactation Consultation Note  Patient Name: Abigail Staci AcostaChelsea Fox WGNFA'OToday's Date: 06/10/2017 Reason for consult: Follow-up assessment;Difficult latch;Infant < 6lbs Baby still not latching to breast.  Bottle feeding expressed milk and formula every 3 hours.  Assisted mom with positioning baby in football hold on right side.  Nipples flat and tissue difficult to compress.  Baby unable to grasp tissue so a 16 mm nipple shield applied. Baby not sucking until nipple shield filled with expressed milk.  Baby then started sucking and took 10 mls fairly well.  When shield empty baby stopped sucking so a 5 french feeding tube and syringe used.  Baby did not develop a good seal and suck was weak.  She did take another 10 ml of formula this way.  Mom and baby for discharge today.  Instructed to put baby to breast every other feeding using prefilled nipple shield, pump every 2-3 hours and bottle feed expressed milk, formula per feeding guidelines.  Mom plans on renting a pump.  Outpatient lactation appointment recommended and mom willing.  Clinic notified.  Maternal Data    Feeding Feeding Type: Formula Nipple Type: Slow - flow  LATCH Score Latch: Repeated attempts needed to sustain latch, nipple held in mouth throughout feeding, stimulation needed to elicit sucking reflex.  Audible Swallowing: None  Type of Nipple: Flat  Comfort (Breast/Nipple): Soft / non-tender  Hold (Positioning): Assistance needed to correctly position infant at breast and maintain latch.  LATCH Score: 5  Interventions Interventions: Assisted with latch;Breast compression;Skin to skin;Adjust position;Breast massage;Support pillows;Hand express  Lactation Tools Discussed/Used Tools: Nipple Shields Nipple shield size: 16   Consult Status Consult Status: Follow-up Follow-up type: Out-patient    Huston FoleyMOULDEN, Falisha Osment S 06/10/2017, 9:53 AM

## 2017-06-10 NOTE — Discharge Summary (Signed)
OB Discharge Summary     Patient Name: Abigail Fox DOB: May 01, 1985 MRN: [id number]  Date of admission: 06/07/2017 Delivering MD: Geryl Rankins   Date of discharge: 06/10/2017  Admitting diagnosis: 32 WK INDUCTION Intrauterine pregnancy: [redacted]w[redacted]d     Secondary diagnosis:  Active Problems:   Pregnancy  Additional problems: H/o SGA, Hep C positive, h/o opioid abuse on Subutex, h/o Anxiety and depression     Discharge diagnosis: Term Pregnancy Delivered                                                                                                Post partum procedures:None  Augmentation: AROM, Pitocin and Cytotec  Complications: None  Hospital course:  Induction of Labor With Cesarean Section  32 y.o. yo [id number] at [redacted]w[redacted]d was admitted to the hospital 06/07/2017 for induction of labor. Patient had a labor course significant for protracted labor, fetal decelerations.. The patient went for cesarean section due to Non-Reassuring FHR, and delivered a Viable infant,06/08/2017  Membrane Rupture Time/Date: 7:44 PM ,06/07/2017   Details of operation can be found in separate operative Note.  Patient had an uncomplicated postpartum course. She is ambulating, tolerating a regular diet, passing flatus, and urinating well.  Patient is discharged home in stable condition on 06/10/17.                                    Physical exam  Vitals:   06/09/17 0500 06/09/17 0827 06/09/17 1830 06/10/17 0432  BP: 112/65 (!) 113/58 (!) 105/52 118/65  Pulse: 71 72 79 65  Resp: 18 18 20 18   Temp: 97.7 F (36.5 C) 98.4 F (36.9 C) 98.2 F (36.8 C) 98 F (36.7 C)  TempSrc: Oral Oral Oral Oral  SpO2:  98% 100%   Weight:      Height:       General: alert, cooperative and no distress Lochia: appropriate Uterine Fundus: firm Incision: Dressing is clean, dry, and intact DVT Evaluation: No evidence of DVT seen on physical exam. Calf/Ankle edema is present Labs: Lab Results  Component Value Date   WBC 13.8  (H) 06/09/2017   HGB 8.6 (L) 06/09/2017   HCT 24.5 (L) 06/09/2017   MCV 82.8 06/09/2017   PLT 114 (L) 06/09/2017   CMP Latest Ref Rng & Units 06/08/2017  Glucose 65 - 99 mg/dL 147(W)  BUN 6 - 20 mg/dL 7  Creatinine 2.95 - 6.21 mg/dL 3.08  Sodium 657 - 846 mmol/L 136  Potassium 3.5 - 5.1 mmol/L 4.0  Chloride 101 - 111 mmol/L 106  CO2 22 - 32 mmol/L 22  Calcium 8.9 - 10.3 mg/dL 9.6(E)  Total Protein 6.5 - 8.1 g/dL 9.5(M)  Total Bilirubin 0.3 - 1.2 mg/dL 0.8  Alkaline Phos 38 - 126 U/L 130(H)  AST 15 - 41 U/L 42(H)  ALT 14 - 54 U/L 46    Discharge instruction: per After Visit Summary and "Baby and Me Booklet".  After visit meds:  Allergies as of 06/10/2017   No Known Allergies  Medication List    STOP taking these medications   DICLEGIS 10-10 MG Tbec Generic drug:  Doxylamine-Pyridoxine   ondansetron 8 MG tablet Commonly known as:  ZOFRAN   pantoprazole 40 MG tablet Commonly known as:  PROTONIX   promethazine 25 MG tablet Commonly known as:  PHENERGAN     TAKE these medications   buprenorphine 8 MG Subl SL tablet Commonly known as:  SUBUTEX Place 8 mg under the tongue 2 (two) times daily.   calcium carbonate 500 MG chewable tablet Commonly known as:  TUMS - dosed in mg elemental calcium Chew 2 tablets by mouth 4 (four) times daily as needed for indigestion or heartburn.   ibuprofen 800 MG tablet Commonly known as:  ADVIL,MOTRIN Take 1 tablet (800 mg total) by mouth every 8 (eight) hours as needed.   prenatal multivitamin Tabs tablet Take 1 tablet by mouth daily at 12 noon.   traMADol 50 MG tablet Commonly known as:  ULTRAM Take 1 tablet (50 mg total) by mouth every 6 (six) hours as needed for moderate pain.   valACYclovir 1000 MG tablet Commonly known as:  VALTREX Take 1,000 mg by mouth daily.            Discharge Care Instructions  (From admission, onward)        Start     Ordered   06/10/17 0000  Discharge wound care:    Comments:  Remove  dressing once pump beeps continuously.   06/10/17 1408      Diet: routine diet  Activity: Advance as tolerated. Pelvic rest for 6 weeks.   Outpatient follow up:2 weeks Follow up Appt:No future appointments. Follow up Visit:No Follow-up on file.  Postpartum contraception: Undecided  Newborn Data: Live born female  Birth Weight: 5 lb 11.4 oz (2590 g) APGAR: 9, 9  Newborn Delivery   Birth date/time:  06/08/2017 09:19:00 Delivery type:  C-Section, Low Transverse C-section categorization:  Primary     Baby Feeding: Breast Disposition:home with mother   06/10/2017 Geryl RankinsEvelyn Crystel Demarco, MD

## 2017-06-27 ENCOUNTER — Inpatient Hospital Stay (HOSPITAL_COMMUNITY)
Admission: AD | Admit: 2017-06-27 | Discharge: 2017-06-28 | DRG: 776 | Disposition: A | Discharge status: Home / Self Care | Payer: 59 | Source: Ambulatory Visit | Attending: Obstetrics and Gynecology | Admitting: Obstetrics and Gynecology

## 2017-06-27 ENCOUNTER — Encounter (HOSPITAL_COMMUNITY): Payer: Self-pay

## 2017-06-27 ENCOUNTER — Other Ambulatory Visit: Payer: Self-pay | Admitting: Obstetrics and Gynecology

## 2017-06-27 ENCOUNTER — Other Ambulatory Visit: Payer: Self-pay

## 2017-06-27 DIAGNOSIS — O1495 Unspecified pre-eclampsia, complicating the puerperium: Principal | ICD-10-CM | POA: Diagnosis present

## 2017-06-27 DIAGNOSIS — Z8614 Personal history of Methicillin resistant Staphylococcus aureus infection: Secondary | ICD-10-CM

## 2017-06-27 LAB — COMPREHENSIVE METABOLIC PANEL WITH GFR
ALT: 43 U/L (ref 14–54)
AST: 34 U/L (ref 15–41)
Albumin: 4.2 g/dL (ref 3.5–5.0)
Alkaline Phosphatase: 92 U/L (ref 38–126)
Anion gap: 12 (ref 5–15)
BUN: 10 mg/dL (ref 6–20)
CO2: 25 mmol/L (ref 22–32)
Calcium: 8.9 mg/dL (ref 8.9–10.3)
Chloride: 101 mmol/L (ref 101–111)
Creatinine, Ser: 0.86 mg/dL (ref 0.44–1.00)
GFR calc Af Amer: >60 mL/min
GFR calc non Af Amer: >60 mL/min
Glucose, Bld: 92 mg/dL (ref 65–99)
Potassium: 3.5 mmol/L (ref 3.5–5.1)
Sodium: 138 mmol/L (ref 135–145)
Total Bilirubin: 0.6 mg/dL (ref 0.3–1.2)
Total Protein: 7.7 g/dL (ref 6.5–8.1)

## 2017-06-27 LAB — CBC
HCT: 33.6 % — ABNORMAL LOW (ref 36.0–46.0)
Hemoglobin: 11.1 g/dL — ABNORMAL LOW (ref 12.0–15.0)
MCH: 27.6 pg (ref 26.0–34.0)
MCHC: 33 g/dL (ref 30.0–36.0)
MCV: 83.6 fL (ref 78.0–100.0)
Platelets: 244 K/uL (ref 150–400)
RBC: 4.02 MIL/uL (ref 3.87–5.11)
RDW: 13.7 % (ref 11.5–15.5)
WBC: 6.6 K/uL (ref 4.0–10.5)

## 2017-06-27 MED ORDER — ONDANSETRON HCL 4 MG PO TABS: 4.0000 mg | ORAL_TABLET | Freq: Three times a day (TID) | ORAL | Status: DC | PRN

## 2017-06-27 MED ORDER — SODIUM CHLORIDE 0.9 % IV SOLN
250.0000 mL | INTRAVENOUS | Status: DC | PRN
Start: 2017-06-27 — End: 2017-06-29

## 2017-06-27 MED ORDER — LABETALOL HCL 5 MG/ML IV SOLN: 20.0000 mg | INTRAVENOUS | Status: DC | PRN

## 2017-06-27 MED ORDER — TRAMADOL HCL 50 MG PO TABS: 50.0000 mg | ORAL_TABLET | Freq: Four times a day (QID) | ORAL | Status: DC | PRN

## 2017-06-27 MED ORDER — BUPRENORPHINE HCL 8 MG SL SUBL
8.0000 mg | SUBLINGUAL_TABLET | Freq: Two times a day (BID) | SUBLINGUAL | Status: DC
  Administered 2017-06-27 – 2017-06-28 (×3): 8 mg via SUBLINGUAL
  Filled 2017-06-27 (×3): qty 1

## 2017-06-27 MED ORDER — HYDRALAZINE HCL 20 MG/ML IJ SOLN: 5.0000 mg | INTRAMUSCULAR | Status: DC | PRN

## 2017-06-27 MED ORDER — ACETAMINOPHEN 325 MG PO TABS
650.0000 mg | ORAL_TABLET | ORAL | Status: DC | PRN
  Administered 2017-06-27 – 2017-06-28 (×3): 650 mg via ORAL
  Filled 2017-06-27 (×4): qty 2

## 2017-06-27 MED ORDER — SODIUM CHLORIDE 0.9% FLUSH
3.0000 mL | Freq: Two times a day (BID) | INTRAVENOUS | Status: DC
  Administered 2017-06-28 (×2): 3 mL via INTRAVENOUS

## 2017-06-27 MED ORDER — IBUPROFEN 600 MG PO TABS
600.0000 mg | ORAL_TABLET | Freq: Four times a day (QID) | ORAL | Status: DC | PRN
  Administered 2017-06-28: 600 mg via ORAL
  Filled 2017-06-27: qty 1

## 2017-06-27 MED ORDER — SODIUM CHLORIDE 0.9% FLUSH
3.0000 mL | INTRAVENOUS | Status: DC | PRN
Start: 2017-06-27 — End: 2017-06-29

## 2017-06-27 MED ORDER — MAGNESIUM SULFATE 40 G IN LACTATED RINGERS - SIMPLE
2.0000 g/h | INTRAVENOUS | Status: AC
  Administered 2017-06-27 – 2017-06-28 (×2): 2 g/h via INTRAVENOUS
  Filled 2017-06-27 (×2): qty 40

## 2017-06-27 MED ORDER — PRENATAL MULTIVITAMIN CH
1.0000 | ORAL_TABLET | Freq: Every day | ORAL | Status: DC
  Administered 2017-06-28: 1 via ORAL
  Filled 2017-06-27 (×2): qty 1

## 2017-06-27 MED ORDER — LACTATED RINGERS IV SOLN
INTRAVENOUS | Status: DC
  Administered 2017-06-27 – 2017-06-28 (×2): via INTRAVENOUS

## 2017-06-27 MED ORDER — BUPRENORPHINE HCL 8 MG SL SUBL: 8.0000 mg | SUBLINGUAL_TABLET | Freq: Every day | SUBLINGUAL | Status: DC

## 2017-06-27 MED ORDER — POLYSACCHARIDE IRON COMPLEX 150 MG PO CAPS
150.0000 mg | ORAL_CAPSULE | Freq: Every day | ORAL | Status: DC
  Administered 2017-06-28: 150 mg via ORAL
  Filled 2017-06-27 (×2): qty 1

## 2017-06-27 MED ORDER — HYDROCORTISONE 1 % EX OINT
TOPICAL_OINTMENT | Freq: Four times a day (QID) | CUTANEOUS | Status: DC
Start: 2017-06-27 — End: 2017-06-29
  Administered 2017-06-27 – 2017-06-28 (×5): via TOPICAL
  Filled 2017-06-27: qty 28.35

## 2017-06-27 MED ORDER — VALACYCLOVIR HCL 500 MG PO TABS
1000.0000 mg | ORAL_TABLET | Freq: Every day | ORAL | Status: DC
  Administered 2017-06-28: 1000 mg via ORAL
  Filled 2017-06-27 (×2): qty 2

## 2017-06-27 MED ORDER — MAGNESIUM SULFATE BOLUS VIA INFUSION
4.0000 g | Freq: Once | INTRAVENOUS | Status: AC
  Administered 2017-06-27: 4 g via INTRAVENOUS
  Filled 2017-06-27: qty 500

## 2017-06-27 NOTE — H&P (Signed)
Abigail Fox is a 32 y.o. female G3 P0020 direct admit for preeclampsia Postpartum day: Delivered via C/S  On March 3. 20 days postpartum Prenatal care has been with [facility] Dion Body).  Pt had several ER visits, admissions for hyperemesis.  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 H/o Hep C-Low Viral load, normal LFTs H/o Anxiety and Depression H/o Oral HSV, on Valtrex  OB History    Gravida  3   Para  1   Term  1   Preterm  0   AB  2   Living  1     SAB  1   TAB      Ectopic      Multiple  0   Live Births  1          Past Medical History:  Diagnosis Date  . Anxiety   . Depression    on prozac in the past  . Drug addiction (HCC)    subutex  . Headache   . Hepatitis    hepatitis C  . Kidney stones   . MRSA infection 2010,2013  . Ovarian cyst    Past Surgical History:  Procedure Laterality Date  . CESAREAN SECTION N/A 06/08/2017   Procedure: CESAREAN SECTION;  Surgeon: Geryl Rankins, MD;  Location: [facility] BIRTHING SUITES;  Service: Obstetrics;  Laterality: N/A;  . DILATION AND EVACUATION N/A 05/14/2016   Procedure: DILATATION AND EVACUATION (D&E) 2ND TRIMESTER WITH ULTRA SOUND GUIDANCE;  Surgeon: Tereso Newcomer, MD;  Location: [facility] ORS;  Service: Gynecology;  Laterality: N/A;  . miscarriage    . TOOTH EXTRACTION     Family History: family history includes Diabetes in her mother; Heart disease in her maternal grandfather; Hypertension in her mother; Kidney cancer in her maternal grandmother; Lung cancer in her paternal grandmother. Social History:  reports that she has never smoked. She has never used smokeless tobacco. She reports that she has current or past drug history. Drug: Marijuana. She reports that she does not drink alcohol.   Other Comments:  H/o Anxiety and Depression  Review of Systems  Eyes:       Seeing spots  Gastrointestinal: Negative  for abdominal pain, nausea and vomiting.  Neurological: Positive for headaches.     Last menstrual period 09/06/2016, unknown if currently breastfeeding.  Physical Exam  Constitutional: She is oriented to person, place, and time. She appears well-developed and well-nourished.  HENT:  Head: Normocephalic and atraumatic.  Eyes: EOM are normal.  Neck: Normal range of motion.  Respiratory: Effort normal.  GI: There is no tenderness.  Genitourinary: Vagina normal.  Musculoskeletal: Normal range of motion. She exhibits edema. She exhibits no tenderness.  Neurological: She is alert and oriented to person, place, and time.  Skin: Skin is warm.  Psychiatric:  Pt's baseline.     06/27/17 2300  97.9 F (36.6 C)  77  -  18  133/87  -  99 %  Room Air  - EC   06/27/17 2226  97.9 F (36.6 C)  86  -  16  147/87Abnormal   -  98 %  Room Air  - EC   06/27/17 2030  98 F (36.7 C)  75  -  16  133/75  Semi-fowlers            Assessment/Plan: Pt sent for Direct Admit High Risk Floor for Postpartum preeclampsia by Dr. Idamae Schuller Orders put in  per Dr. Idamae SchullerVarnardo Magnesium Sulfate for 24 hours H/o substance abuse on Subutex H/o Hep C H/o IUFD  . buprenorphine  8 mg Sublingual BID  . hydrocortisone   Topical QID  . iron polysaccharides  150 mg Oral Daily  . prenatal multivitamin  1 tablet Oral Q1200  . sodium chloride flush  3 mL Intravenous Q12H  . valACYclovir  1,000 mg Oral Daily     Lori A Clemmons CNM 06/27/2017, 9:14 PM

## 2017-06-28 LAB — PROTEIN / CREATININE RATIO, URINE
Creatinine, Urine: 85 mg/dL
Total Protein, Urine: <6 mg/dL

## 2017-06-28 LAB — MAGNESIUM: MAGNESIUM: 4.8 mg/dL — AB (ref 1.7–2.4)

## 2017-06-28 MED ORDER — BUTALBITAL-APAP-CAFFEINE 50-325-40 MG PO TABS
1.0000 | ORAL_TABLET | ORAL | Status: DC | PRN
Start: 2017-06-28 — End: 2017-06-29
  Administered 2017-06-28: 1 via ORAL
  Filled 2017-06-28: qty 1

## 2017-06-28 NOTE — Progress Notes (Addendum)
Post Partum Day 20 Subjective: Patient complains of a headache, it did not improve despite tylenol use. She denies scotomata, blurry vision, chest pain or shortness of breath or palpitations.  She denies nausea/vomiting/abdominal pain.    Objective: Blood pressure (!) 139/94, pulse 84, temperature 97.7 F (36.5 C), temperature source Oral, resp. rate 18, height 5\' 4"  (1.626 m), weight 108 kg (238 lb), last menstrual period 09/06/2016, SpO2 94 %, unknown if currently breastfeeding. Vitals:   06/28/17 0621 06/28/17 0700 06/28/17 0749 06/28/17 1158  BP: 139/89  137/90 (!) 139/94  Pulse: 74  78 84  Resp: 16 16 18 18   Temp: 97.7 F (36.5 C)  97.9 F (36.6 C) 97.7 F (36.5 C)  TempSrc: Oral  Oral Oral  SpO2: 96%  97% 94%  Weight:      Height:       Input: 838/6hrs (to 1300 hrs today) Output: 1200cc/6hrs.   Physical Exam:  General: alert, cooperative and no distress Lochia: appropriate Uterine Fundus: firm DVT Evaluation: No evidence of DVT seen on physical exam. No significant calf/ankle edema. CVS: s1, S2, RRR Pulm: Clear to auscultation bilaterally Abdomen: Obese, soft, non tender Extremities: Warm and well perfused. 2+ patellar reflex bilaterally.   Recent Labs    06/27/17 2048  HGB 11.1*  HCT 33.6*   CBC    Component Value Date/Time   WBC 6.6 06/27/2017 2048   RBC 4.02 06/27/2017 2048   HGB 11.1 (L) 06/27/2017 2048   HCT 33.6 (L) 06/27/2017 2048   PLT 244 06/27/2017 2048   MCV 83.6 06/27/2017 2048   MCH 27.6 06/27/2017 2048   MCHC 33.0 06/27/2017 2048   RDW 13.7 06/27/2017 2048   LYMPHSABS 3.3 05/19/2016 0103   MONOABS 0.8 05/19/2016 0103   EOSABS 0.3 05/19/2016 0103   BASOSABS 0.0 05/19/2016 0103   CMP     Component Value Date/Time   NA 138 06/27/2017 2048   K 3.5 06/27/2017 2048   CL 101 06/27/2017 2048   CO2 25 06/27/2017 2048   GLUCOSE 92 06/27/2017 2048   BUN 10 06/27/2017 2048   CREATININE 0.86 06/27/2017 2048   CALCIUM 8.9 06/27/2017 2048   PROT 7.7 06/27/2017 2048   ALBUMIN 4.2 06/27/2017 2048   AST 34 06/27/2017 2048   ALT 43 06/27/2017 2048   ALKPHOS 92 06/27/2017 2048   BILITOT 0.6 06/27/2017 2048   GFRNONAA >60 06/27/2017 2048   GFRAA >60 06/27/2017 2048   Protein / creatinine ratio, urine  Order: [id number]  Status:  Final result  Visible to patient:  No (Not Released)  Next appt:  None   Ref Range & Units 1d ago  Creatinine, Urine mg/dL 40.9885.00   Total Protein, Urine mg/dL <1.19<6.00   Comment: REPEATED TO VERIFY  Protein Creatinine Ratio 0.00 - 0.15 mg/mg      Comment: RESULT BELOW REPORTABLE RANGE,  UNABLE TO CALCULATE.  Performed at [facility], [address] [place], [place] [address]        Assessment/Plan: 32 y/o admitted for headaches and elevated blood pressures, presumed post partum preeclampsia with severe features -Continue with magnesium sulfate until tonight 2213, c/w monitoring for signs and symptoms of magnesium toxicity.  -Discussed preeclampsia precautions.  -Patient would like to be discharged later tonight if she is stable after her magnesium sulfate infusion.   LOS: 1 day  Konrad FelixKULWA,Jaleeyah Munce WAKURU, MD 06/28/2017, 2:19 PM

## 2017-06-28 NOTE — H&P (Signed)
Abigail Fox is an 32 y.o. female G3 P1021 s/p delivery via c-section on 06/08/2017 due to non-reassuring fetal tracing remote from delivery.  One week postpartum pt presented for follow up and BP was 143/89 and she had lower extremity swelling.  Preeclampsia labs were negative at the time except Creatinine was 0.91, PCR was 0.1.  One week later, swelling had not resolved and pt was having intermittent headaches relieved by Tylenol.  Pt was started on Lasix.  Pt presented today and states that she was "chewing Tylenol like crazy" in the past 2-3 days because she was having a headache.  Swelling has not decreased much.  She denies abdominal pain currently.  Pt is breast feeding.  Pt has a h/o Hepatitis C with mildly elevated LFTs, very low viral load.  Menstrual History:  Patient's last menstrual period was 09/06/2016.    Past Medical History:  Diagnosis Date  . Anxiety   . Depression    on prozac in the past  . Drug addiction (HCC)    subutex  . Headache   . Hepatitis    hepatitis C  . Kidney stones   . MRSA infection 2010,2013  . Ovarian cyst     Past Surgical History:  Procedure Laterality Date  . CESAREAN SECTION N/A 06/08/2017   Procedure: CESAREAN SECTION;  Surgeon: Geryl Rankins, MD;  Location: [facility] BIRTHING SUITES;  Service: Obstetrics;  Laterality: N/A;  . DILATION AND EVACUATION N/A 05/14/2016   Procedure: DILATATION AND EVACUATION (D&E) 2ND TRIMESTER WITH ULTRA SOUND GUIDANCE;  Surgeon: Tereso Newcomer, MD;  Location: [facility] ORS;  Service: Gynecology;  Laterality: N/A;  . miscarriage    . TOOTH EXTRACTION      Family History  Problem Relation Age of Onset  . Diabetes Mother   . Hypertension Mother   . Heart disease Maternal Grandfather   . Kidney cancer Maternal Grandmother   . Lung cancer Paternal Grandmother     Social History:  reports that she has never smoked. She has never used smokeless tobacco. She reports that she has current or past drug history. Drug:  Marijuana. She reports that she does not drink alcohol.  Allergies: No Known Allergies  Medications Prior to Admission  Medication Sig Dispense Refill Last Dose  . buprenorphine (SUBUTEX) 8 MG SUBL SL tablet Place 8 mg under the tongue 2 (two) times daily.  0 06/27/2017 at Unknown time  . calcium carbonate (TUMS - DOSED IN MG ELEMENTAL CALCIUM) 500 MG chewable tablet Chew 2 tablets by mouth 4 (four) times daily as needed for indigestion or heartburn.    Past Week at Unknown time  . furosemide (LASIX) 20 MG tablet Take 20 mg by mouth 2 (two) times daily.   06/27/2017 at Unknown time  . ibuprofen (ADVIL,MOTRIN) 800 MG tablet Take 1 tablet (800 mg total) by mouth every 8 (eight) hours as needed. 30 tablet 0 Past Week at Unknown time  . iron polysaccharides (NIFEREX) 150 MG capsule Take 1 capsule (150 mg total) by mouth daily. 30 capsule 2 06/27/2017 at Unknown time  . pantoprazole (PROTONIX) 40 MG tablet Take 40 mg by mouth daily.   06/27/2017 at Unknown time  . Prenatal Vit-Fe Fumarate-FA (PRENATAL MULTIVITAMIN) TABS tablet Take 1 tablet by mouth daily at 12 noon.   06/27/2017 at Unknown time  . traMADol (ULTRAM) 50 MG tablet Take 1 tablet (50 mg total) by mouth every 6 (six) hours as needed for moderate pain. 20 tablet 0 06/27/2017 at Unknown time  .  valACYclovir (VALTREX) 1000 MG tablet Take 1,000 mg by mouth daily.   06/27/2017 at Unknown time    Review of Systems  Gastrointestinal: Negative for abdominal pain.  Neurological: Positive for headaches.    Blood pressure 133/87, pulse 77, temperature 97.9 F (36.6 C), temperature source Axillary, resp. rate 18, height 5\' 4"  (1.626 m), last menstrual period 09/06/2016, SpO2 99 %, unknown if currently breastfeeding. Physical Exam  Constitutional: She is oriented to person, place, and time. She appears well-developed and well-nourished.  HENT:  Head: Normocephalic and atraumatic.  Eyes: EOM are normal.  Neck: Normal range of motion.  Cardiovascular:  Normal rate and regular rhythm.  Respiratory: Effort normal. No respiratory distress.  GI: Soft.  Musculoskeletal: Normal range of motion. She exhibits edema. She exhibits no tenderness.  Neurological: She is alert and oriented to person, place, and time.  Skin: Skin is warm and dry.  Psychiatric: She has a normal mood and affect.    Results for orders placed or performed during the hospital encounter of 06/27/17 (from the past 24 hour(s))  CBC     Status: Abnormal   Collection Time: 06/27/17  8:48 PM  Result Value Ref Range   WBC 6.6 4.0 - 10.5 K/uL   RBC 4.02 3.87 - 5.11 MIL/uL   Hemoglobin 11.1 (L) 12.0 - 15.0 g/dL   HCT 16.133.6 (L) 09.636.0 - 04.546.0 %   MCV 83.6 78.0 - 100.0 fL   MCH 27.6 26.0 - 34.0 pg   MCHC 33.0 30.0 - 36.0 g/dL   RDW 40.913.7 81.111.5 - 91.415.5 %   Platelets 244 150 - 400 K/uL  Comprehensive metabolic panel     Status: None   Collection Time: 06/27/17  8:48 PM  Result Value Ref Range   Sodium 138 135 - 145 mmol/L   Potassium 3.5 3.5 - 5.1 mmol/L   Chloride 101 101 - 111 mmol/L   CO2 25 22 - 32 mmol/L   Glucose, Bld 92 65 - 99 mg/dL   BUN 10 6 - 20 mg/dL   Creatinine, Ser 7.820.86 0.44 - 1.00 mg/dL   Calcium 8.9 8.9 - 95.610.3 mg/dL   Total Protein 7.7 6.5 - 8.1 g/dL   Albumin 4.2 3.5 - 5.0 g/dL   AST 34 15 - 41 U/L   ALT 43 14 - 54 U/L   Alkaline Phosphatase 92 38 - 126 U/L   Total Bilirubin 0.6 0.3 - 1.2 mg/dL   GFR calc non Af Amer >60 >60 mL/min   GFR calc Af Amer >60 >60 mL/min   Anion gap 12 5 - 15    No results found.  Assessment/Plan: Postpartum Preeclampsia. H/o Hepatitis C-mildly elevated LFTs during pregnancy. H/o Opioid Addiction  Admit for Magnesium Sulfate x 24 hours. Labs on admission.  Procardia XL 30 mg if BP is elevated post magnesium. Continue Subutex q am. [facility] covering.  Pt aware. Lactation support prn.    Geryl Rankinsvelyn Viyaan Champine 06/28/2017, 12:00 AM

## 2017-06-28 NOTE — Plan of Care (Signed)
Patient is resting in bed at this time. Denies any pain or discomfort. Patient has breastpump in room. Patient states, "I don't need any help with the pump. I know how to use it." Encouraged Patient to pump every 2 to 3 hours. Patient verbalizes an understanding. Patient receiving Magnesium infusion. Denies any blurred vision, headaches, or epigastric pain.

## 2017-06-28 NOTE — Progress Notes (Signed)
Mom has been readmitted to hospital, Baby is home with dad. Mom has been having trouble getting the baby to latch to breast. Has been mostly pumping and bottle feeding EBM. Mom has not pumped since 7 pm last night. DEBP setup for mom by RN but mom did not pump through the night. Assisted with pumping. Asking about latching the baby. Encouraged to make OP appointment when she is feeling better for assist. Reviewed our phone number. Encouraged to pump at least q 3 hours to help promtoe milk supply. No questions at present. To call prn

## 2017-06-28 NOTE — Discharge Summary (Signed)
OB Discharge Summary     Patient Name: Abigail Fox DOB: [birth year] MRN: [id number]  Date of admission: 06/27/2017 Delivering MD: Geryl Rankins   Date of discharge: 06/28/2017  Admitting diagnosis: PP preeclampsia Intrauterine pregnancy: [redacted]w[redacted]d     Secondary diagnosis:  Active Problems:   Preeclampsia in postpartum period  Additional problems: none     Discharge diagnosis: Postpartum pre eclampsia                                                                                            Hospital course:    Abigail Fox is an 32 y.o. female G3 P1021 s/p delivery via c-section on 06/08/2017 due to non-reassuring fetal tracing remote from delivery.  Pt is breast feeding.  Pt was admitted 20 days postpartum with elevated BP and headache.  Pre eclamptic labs negative.  Pt was started on Magnesium Sulfate.  Magnesium Sulfate dc at 24 hours.  Headache resolved and BP stable.  Pt will be discharged home tonight with follow up in office in one week.  MD aware of POC.   06/08/2017  Information for the patient's newborn:  Chonda, Baney [[id number]]  Delivery Method: Vag-Spont    Pateint had an uncomplicated postpartum course.  She is ambulating, tolerating a regular diet, passing flatus, and urinating well. Patient is discharged home in stable condition on 06/28/17.   Physical exam  Vitals:   06/28/17 1637 06/28/17 1950 06/28/17 2034 06/28/17 2127  BP: 128/75 128/83    Pulse: 72 70    Resp: 18 19 18 18   Temp: (!) 97.5 F (36.4 C) 99.7 F (37.6 C)    TempSrc: Oral Axillary    SpO2: 98% 98%    Weight:      Height:       General: alert, cooperative and no distress Lochia: appropriate Uterine Fundus: firm Incision: N/A DVT Evaluation: No evidence of DVT seen on physical exam. Negative Homan's sign. Labs: Lab Results  Component Value Date   WBC 6.6 06/27/2017   HGB 11.1 (L) 06/27/2017   HCT 33.6 (L) 06/27/2017   MCV 83.6 06/27/2017   PLT 244 06/27/2017    CMP Latest Ref Rng & Units 06/27/2017  Glucose 65 - 99 mg/dL 92  BUN 6 - 20 mg/dL 10  Creatinine 7.82 - 9.56 mg/dL 2.13  Sodium 086 - 578 mmol/L 138  Potassium 3.5 - 5.1 mmol/L 3.5  Chloride 101 - 111 mmol/L 101  CO2 22 - 32 mmol/L 25  Calcium 8.9 - 10.3 mg/dL 8.9  Total Protein 6.5 - 8.1 g/dL 7.7  Total Bilirubin 0.3 - 1.2 mg/dL 0.6  Alkaline Phos 38 - 126 U/L 92  AST 15 - 41 U/L 34  ALT 14 - 54 U/L 43    Discharge instruction: per After Visit Summary and "Baby and Me Booklet".  After visit meds:  PNV, Subtex, Tylenol and Motrin  Diet: routine diet  Activity: Advance as tolerated. Pelvic rest for 6 weeks.   Outpatient follow up:1 week Follow up Appt:No future appointments. Follow up Visit:No follow-ups on file.  Postpartum contraception: Not Discussed  Newborn Data: Live  born female  Birth Weight: 5 lb 11.4 oz (2590 g) APGAR: 9, 9  Newborn Delivery   Birth date/time:  06/08/2017 09:19:00 Delivery type:  C-Section, Low Transverse C-section categorization:  Primary     Baby Feeding: Breast Disposition:  Pt stable   06/28/2017 Kenney HousemanNancy Jean Hampton Cost, CNM

## 2017-06-29 NOTE — Progress Notes (Signed)
Discharge instructions given and reviewed with patient, no questions at this time. Vital signs WDL. Patient discharged home via wheelchair.

## 2017-12-29 ENCOUNTER — Emergency Department (HOSPITAL_COMMUNITY)
Admission: EM | Admit: 2017-12-29 | Discharge: 2017-12-29 | Disposition: A | Discharge status: Home / Self Care | Payer: Managed Care, Other (non HMO) | Attending: Emergency Medicine | Admitting: Emergency Medicine

## 2017-12-29 ENCOUNTER — Emergency Department (HOSPITAL_COMMUNITY): Payer: Managed Care, Other (non HMO)

## 2017-12-29 ENCOUNTER — Encounter (HOSPITAL_COMMUNITY): Payer: Self-pay | Admitting: Emergency Medicine

## 2017-12-29 DIAGNOSIS — N938 Other specified abnormal uterine and vaginal bleeding: Secondary | ICD-10-CM | POA: Insufficient documentation

## 2017-12-29 DIAGNOSIS — M549 Dorsalgia, unspecified: Secondary | ICD-10-CM | POA: Diagnosis not present

## 2017-12-29 DIAGNOSIS — R52 Pain, unspecified: Secondary | ICD-10-CM

## 2017-12-29 DIAGNOSIS — Z79899 Other long term (current) drug therapy: Secondary | ICD-10-CM | POA: Diagnosis not present

## 2017-12-29 DIAGNOSIS — R1011 Right upper quadrant pain: Secondary | ICD-10-CM | POA: Diagnosis not present

## 2017-12-29 DIAGNOSIS — R1110 Vomiting, unspecified: Secondary | ICD-10-CM | POA: Insufficient documentation

## 2017-12-29 LAB — COMPREHENSIVE METABOLIC PANEL
ALK PHOS: 75 U/L (ref 38–126)
ALT: 95 U/L — ABNORMAL HIGH (ref 0–44)
AST: 63 U/L — AB (ref 15–41)
Albumin: 4.4 g/dL (ref 3.5–5.0)
Anion gap: 8 (ref 5–15)
BILIRUBIN TOTAL: 0.6 mg/dL (ref 0.3–1.2)
BUN: 11 mg/dL (ref 6–20)
CALCIUM: 9.4 mg/dL (ref 8.9–10.3)
CHLORIDE: 107 mmol/L (ref 98–111)
CO2: 27 mmol/L (ref 22–32)
CREATININE: 0.86 mg/dL (ref 0.44–1.00)
GFR calc Af Amer: >60 mL/min (ref >60)
Glucose, Bld: 105 mg/dL — ABNORMAL HIGH (ref 70–99)
Potassium: 4.1 mmol/L (ref 3.5–5.1)
Sodium: 142 mmol/L (ref 135–145)
Total Protein: 8.3 g/dL — ABNORMAL HIGH (ref 6.5–8.1)

## 2017-12-29 LAB — URINALYSIS, ROUTINE W REFLEX MICROSCOPIC
Bacteria, UA: NONE SEEN
Bilirubin Urine: NEGATIVE
Glucose, UA: NEGATIVE mg/dL
KETONES UR: NEGATIVE mg/dL
LEUKOCYTES UA: NEGATIVE
Nitrite: NEGATIVE
PH: 5 (ref 5.0–8.0)
PROTEIN: NEGATIVE mg/dL
SPECIFIC GRAVITY, URINE: 1.016 (ref 1.005–1.030)

## 2017-12-29 LAB — CBC
HCT: 38.3 % (ref 36.0–46.0)
Hemoglobin: 13.1 g/dL (ref 12.0–15.0)
MCH: 30 pg (ref 26.0–34.0)
MCHC: 34.2 g/dL (ref 30.0–36.0)
MCV: 87.8 fL (ref 78.0–100.0)
PLATELETS: 149 10*3/uL — AB (ref 150–400)
RBC: 4.36 MIL/uL (ref 3.87–5.11)
RDW: 13.2 % (ref 11.5–15.5)
WBC: 9.3 10*3/uL (ref 4.0–10.5)

## 2017-12-29 LAB — I-STAT BETA HCG BLOOD, ED (MC, WL, AP ONLY): I-stat hCG, quantitative: <5 m[IU]/mL (ref <5)

## 2017-12-29 LAB — LIPASE, BLOOD: LIPASE: 32 U/L (ref 11–51)

## 2017-12-29 MED ORDER — ONDANSETRON HCL 4 MG/2ML IJ SOLN
4.0000 mg | Freq: Once | INTRAMUSCULAR | Status: AC
  Administered 2017-12-29: 4 mg via INTRAVENOUS
  Filled 2017-12-29: qty 2

## 2017-12-29 MED ORDER — LACTATED RINGERS IV BOLUS
1000.0000 mL | Freq: Once | INTRAVENOUS | Status: AC
  Administered 2017-12-29: 1000 mL via INTRAVENOUS

## 2017-12-29 MED ORDER — OXYCODONE HCL 5 MG PO TABS: 5.0000 mg | ORAL_TABLET | ORAL | 0 refills | Status: DC | PRN

## 2017-12-29 MED ORDER — IOPAMIDOL (ISOVUE-300) INJECTION 61%
100.0000 mL | Freq: Once | INTRAVENOUS | Status: AC | PRN
  Administered 2017-12-29: 100 mL via INTRAVENOUS

## 2017-12-29 MED ORDER — OXYCODONE-ACETAMINOPHEN 5-325 MG PO TABS
1.0000 | ORAL_TABLET | Freq: Once | ORAL | Status: AC
  Administered 2017-12-29: 1 via ORAL
  Filled 2017-12-29: qty 1

## 2017-12-29 MED ORDER — FENTANYL CITRATE (PF) 100 MCG/2ML IJ SOLN
50.0000 ug | Freq: Once | INTRAMUSCULAR | Status: AC
  Administered 2017-12-29: 50 ug via INTRAVENOUS
  Filled 2017-12-29: qty 2

## 2017-12-29 MED ORDER — IOPAMIDOL (ISOVUE-300) INJECTION 61%
INTRAVENOUS | Status: AC
  Filled 2017-12-29: qty 100

## 2017-12-29 MED ORDER — ONDANSETRON HCL 4 MG PO TABS: 4.0000 mg | ORAL_TABLET | Freq: Three times a day (TID) | ORAL | 0 refills | Status: DC | PRN

## 2017-12-29 MED ORDER — DIAZEPAM 2 MG PO TABS
2.0000 mg | ORAL_TABLET | Freq: Once | ORAL | Status: AC
  Administered 2017-12-29: 2 mg via ORAL
  Filled 2017-12-29: qty 1

## 2017-12-29 NOTE — ED Notes (Signed)
Pt made aware urine needed. Pt states that she voided already while waiting and cannot void at this time. Pt provided with cup.

## 2017-12-29 NOTE — ED Triage Notes (Signed)
Pt reports that she having abd and back pains with vomiting and vaginal bleeding that started this morning,. Reports took Zofran this morning and relieved some of vomiting

## 2017-12-29 NOTE — ED Provider Notes (Signed)
[facility]-EMERGENCY DEPT Provider Note   CSN: [id number] Arrival date & time: 12/29/17  1030     History   Chief Complaint Chief Complaint  Patient presents with  . Abdominal Pain  . Emesis  . Back Pain  . Vaginal Bleeding    HPI Abigail Fox is a 32 y.o. female.   Abdominal Pain   This is a recurrent problem. The current episode started 6 to 12 hours ago. The problem occurs constantly. The problem has not changed since onset.The pain is associated with eating. The pain is located in the RUQ. The pain is at a severity of 6/10. The pain is moderate. Associated symptoms include vomiting. Nothing aggravates the symptoms. Nothing relieves the symptoms.  Emesis   Associated symptoms include abdominal pain.  Back Pain   Associated symptoms include abdominal pain.  Vaginal Bleeding  Primary symptoms include vaginal bleeding. Associated symptoms include abdominal pain and vomiting.    Past Medical History:  Diagnosis Date  . Anxiety   . Depression    on prozac in the past  . Drug addiction (HCC)    subutex  . Headache   . Hepatitis    hepatitis C  . Kidney stones   . MRSA infection 2010,2013  . Ovarian cyst     Patient Active Problem List   Diagnosis Date Noted  . Preeclampsia in postpartum period 06/27/2017  . Pregnancy 06/07/2017  . Pregnancy with 30 completed weeks gestation   . Abdominal pain complicating pregnancy 01/20/2017  . Pregnancy complicated by subutex maintenance, antepartum (HCC) 04/11/2016  . Marijuana use 04/11/2016    Past Surgical History:  Procedure Laterality Date  . CESAREAN SECTION N/A 06/08/2017   Procedure: CESAREAN SECTION;  Surgeon: Geryl Rankins, MD;  Location: [facility] BIRTHING SUITES;  Service: Obstetrics;  Laterality: N/A;  . DILATION AND EVACUATION N/A 05/14/2016   Procedure: DILATATION AND EVACUATION (D&E) 2ND TRIMESTER WITH ULTRA SOUND GUIDANCE;  Surgeon: Tereso Newcomer, MD;  Location: [facility] ORS;  Service:  Gynecology;  Laterality: N/A;  . miscarriage    . TOOTH EXTRACTION       OB History    Gravida  3   Para  1   Term  1   Preterm  0   AB  2   Living  1     SAB  1   TAB      Ectopic      Multiple  0   Live Births  1            Home Medications    Prior to Admission medications   Medication Sig Start Date End Date Taking? Authorizing Provider  buprenorphine (SUBUTEX) 8 MG SUBL SL tablet Place 8 mg under the tongue 2 (two) times daily. 10/23/16  Yes [provider]  [name] 0.35 MG tablet Take 1 tablet by mouth daily. 11/26/17  Yes [provider]  ibuprofen (ADVIL,MOTRIN) 800 MG tablet Take 1 tablet (800 mg total) by mouth every 8 (eight) hours as needed. Patient taking differently: Take 800 mg by mouth every 8 (eight) hours as needed for moderate pain.  06/10/17  Yes Geryl Rankins, MD  Multiple Vitamins-Minerals (MULTIVITAMIN ADULT) CHEW Chew 1 each by mouth daily.   Yes [provider]  traMADol (ULTRAM) 50 MG tablet Take 1 tablet (50 mg total) by mouth every 6 (six) hours as needed for moderate pain. 06/10/17  Yes Geryl Rankins, MD  valACYclovir (VALTREX) 500 MG tablet Take 500 mg by  mouth every 12 (twelve) hours. 12/15/17  Yes [provider]  ondansetron (ZOFRAN) 4 MG tablet Take 1 tablet (4 mg total) by mouth every 8 (eight) hours as needed for nausea or vomiting. 12/29/17   Paige Vanderwoude, Barbara Cower, MD  oxyCODONE (ROXICODONE) 5 MG immediate release tablet Take 1 tablet (5 mg total) by mouth every 4 (four) hours as needed for severe pain. 12/29/17   Jayce Boyko, Barbara Cower, MD    Family History Family History  Problem Relation Age of Onset  . Diabetes Mother   . Hypertension Mother   . Heart disease Maternal Grandfather   . Kidney cancer Maternal Grandmother   . Lung cancer Paternal Grandmother     Social History Social History   Tobacco Use  . Smoking status: Never Smoker  . Smokeless tobacco: Never Used  Substance Use Topics  . Alcohol  use: No  . Drug use: Yes    Types: Marijuana    Comment: Subutex     Allergies   Patient has no known allergies.   Review of Systems Review of Systems  Gastrointestinal: Positive for abdominal pain and vomiting.  Genitourinary: Positive for vaginal bleeding.  Musculoskeletal: Positive for back pain.  All other systems reviewed and are negative.    Physical Exam Updated Vital Signs BP 131/90 (BP Location: Left Arm)   Pulse 60   Temp 98.4 F (36.9 C) (Oral)   Resp 18   LMP 12/08/2017   SpO2 100%   Physical Exam  Constitutional: She appears well-developed and well-nourished.  HENT:  Head: Normocephalic and atraumatic.  Eyes: Pupils are equal, round, and reactive to light. EOM are normal.  Neck: Normal range of motion.  Cardiovascular: Normal rate and regular rhythm.  Pulmonary/Chest: Effort normal. No stridor. No respiratory distress.  Abdominal: Normal appearance and bowel sounds are normal. She exhibits no distension and no ascites. There is tenderness in the right upper quadrant and epigastric area.  Neurological: She is alert.  Skin: Skin is warm and dry.  Nursing note and vitals reviewed.    ED Treatments / Results  Labs (all labs ordered are listed, but only abnormal results are displayed) Labs Reviewed  COMPREHENSIVE METABOLIC PANEL - Abnormal; Notable for the following components:      Result Value   Glucose, Bld 105 (*)    Total Protein 8.3 (*)    AST 63 (*)    ALT 95 (*)    All other components within normal limits  CBC - Abnormal; Notable for the following components:   Platelets 149 (*)    All other components within normal limits  URINALYSIS, ROUTINE W REFLEX MICROSCOPIC - Abnormal; Notable for the following components:   Hgb urine dipstick SMALL (*)    All other components within normal limits  LIPASE, BLOOD  I-STAT BETA HCG BLOOD, ED ([facility], [facility], [facility] ONLY)    EKG None  Radiology Ct Abdomen Pelvis W Contrast  Result Date:  12/29/2017 CLINICAL DATA:  Pt reports that she having abd and back pains with vomiting and vaginal bleeding that started this morning,. Reports took Zofran this morning and relieved some of vomiting EXAM: CT ABDOMEN AND PELVIS WITH CONTRAST TECHNIQUE: Multidetector CT imaging of the abdomen and pelvis was performed using the standard protocol following bolus administration of intravenous contrast. CONTRAST:  ISOVUE-300 IOPAMIDOL (ISOVUE-300) INJECTION 61% COMPARISON:  CT of the abdomen and pelvis on 04/07/2017 FINDINGS: Lower chest: No acute abnormality. Hepatobiliary: Gallbladder is mildly distended but otherwise normal in appearance. No focal liver  lesions. Pancreas: Unremarkable. No pancreatic ductal dilatation or surrounding inflammatory changes. Spleen: Normal in size without focal abnormality. Adrenals/Urinary Tract: Adrenal glands are unremarkable. Kidneys are normal, without renal calculi, focal lesion, or hydronephrosis. Bladder is unremarkable. Stomach/Bowel: Stomach and small bowel loops are normal in appearance. The appendix is well seen and has a normal appearance. Loops of colon are normal in appearance. Moderate stool burden in the ascending and transverse colon. Vascular/Lymphatic: No significant vascular findings are present. No enlarged abdominal or pelvic lymph nodes. Reproductive: The uterus is present. No adnexal mass. No free pelvic fluid. Other: Anterior abdominal wall is unremarkable. Musculoskeletal: No acute or significant osseous findings. IMPRESSION: 1.  No evidence for acute  abnormality. 2. Mild gallbladder distention without evidence for acute cholecystitis. 3. Moderate stool burden. Otherwise bowel loops are normal in appearance. 4. Normal appearance of the uterus/ovaries. Electronically Signed   By: Norva PavlovElizabeth  Brown M.D.   On: 12/29/2017 13:00   [place] Abdomen Limited Ruq  Result Date: 12/29/2017 CLINICAL DATA:  RIGHT upper quadrant and back pain with nausea and elevated labs.  EXAM: ULTRASOUND ABDOMEN LIMITED RIGHT UPPER QUADRANT COMPARISON:  None. FINDINGS: Gallbladder: Multiple layering gallstones within the gallbladder, largest measuring 11 mm. No gallbladder wall thickening or pericholecystic fluid seen. No sonographic Murphy's sign elicited during the exam. Common bile duct: Diameter: 8 mm Liver: No focal lesion identified. Within normal limits in parenchymal echogenicity. Portal vein is patent on color Doppler imaging with normal direction of blood flow towards the liver. IMPRESSION: 1. Common bile duct is dilated to 8 mm. No bile duct stone identified, however, the distal CBD is obscured by overlying bowel gas. Given the abnormal labs, presumably elevated LFTs, would consider MRCP or ERCP to exclude obstructing bile duct stone. 2. Cholelithiasis but no evidence of acute cholecystitis. 3. Liver appears normal. Electronically Signed   By: Bary RichardStan  Maynard M.D.   On: 12/29/2017 14:33    Procedures Procedures (including critical care time)  Medications Ordered in ED Medications  iopamidol (ISOVUE-300) 61 % injection (has no administration in time range)  lactated ringers bolus 1,000 mL (0 mLs Intravenous Stopped 12/29/17 1430)  fentaNYL (SUBLIMAZE) injection 50 mcg (50 mcg Intravenous Given 12/29/17 1211)  ondansetron (ZOFRAN) injection 4 mg (4 mg Intravenous Given 12/29/17 1211)  diazepam (VALIUM) tablet 2 mg (2 mg Oral Given 12/29/17 1224)  iopamidol (ISOVUE-300) 61 % injection 100 mL (100 mLs Intravenous Contrast Given 12/29/17 1244)  oxyCODONE-acetaminophen (PERCOCET/ROXICET) 5-325 MG per tablet 1 tablet (1 tablet Oral Given 12/29/17 1559)     Initial Impression / Assessment and Plan / ED Course  I have reviewed the triage vital signs and the nursing notes.  Pertinent labs & imaging results that were available during my care of the patient were reviewed by me and considered in my medical decision making (see chart for details).     Plan to get an MRCP however  patient said that she had to go pick up her daughter did not want to stay to get it done.  She states she felt better she was tolerating p.o. she wanted to go.  She states is similar to previous episode she had in the past were liver enzymes to be high and she did have pain and difficulty with nausea.  I suggest that she probably need to follow-up with gastroenterology for an ERCP if she did not want to get a MRI.  Otherwise patient is stable for discharge per her request.  Final Clinical Impressions(s) / ED Diagnoses  Final diagnoses:  Pain  RUQ abdominal pain    ED Discharge Orders         Ordered    oxyCODONE (ROXICODONE) 5 MG immediate release tablet  Every 4 hours PRN     12/29/17 1527    ondansetron (ZOFRAN) 4 MG tablet  Every 8 hours PRN     12/29/17 1527           Wisdom Seybold, Barbara Cower, MD 12/29/17 1623

## 2017-12-29 NOTE — ED Notes (Signed)
Pt requesting pain meds prior to D/C/. Verbal order received from MD

## 2017-12-29 NOTE — ED Notes (Signed)
ED Provider at bedside. 

## 2018-01-02 ENCOUNTER — Ambulatory Visit (INDEPENDENT_AMBULATORY_CARE_PROVIDER_SITE_OTHER): Payer: Managed Care, Other (non HMO) | Admitting: Internal Medicine

## 2018-01-02 ENCOUNTER — Encounter: Payer: Self-pay | Admitting: Internal Medicine

## 2018-01-02 VITALS — BP 120/70 | HR 56 | Ht 63.25 in | Wt 247.0 lb

## 2018-01-02 DIAGNOSIS — R945 Abnormal results of liver function studies: Secondary | ICD-10-CM

## 2018-01-02 DIAGNOSIS — R1110 Vomiting, unspecified: Secondary | ICD-10-CM

## 2018-01-02 DIAGNOSIS — R935 Abnormal findings on diagnostic imaging of other abdominal regions, including retroperitoneum: Secondary | ICD-10-CM | POA: Diagnosis not present

## 2018-01-02 DIAGNOSIS — K8020 Calculus of gallbladder without cholecystitis without obstruction: Secondary | ICD-10-CM | POA: Diagnosis not present

## 2018-01-02 DIAGNOSIS — R1011 Right upper quadrant pain: Secondary | ICD-10-CM | POA: Diagnosis not present

## 2018-01-02 DIAGNOSIS — G8929 Other chronic pain: Secondary | ICD-10-CM

## 2018-01-02 DIAGNOSIS — R109 Unspecified abdominal pain: Secondary | ICD-10-CM

## 2018-01-02 DIAGNOSIS — R7989 Other specified abnormal findings of blood chemistry: Secondary | ICD-10-CM

## 2018-01-02 NOTE — Progress Notes (Signed)
HISTORY OF PRESENT ILLNESS:  Abigail Fox is a pleasant 32 y.o. 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  She is accompanied by her husband.  Patient reports having had problems with recurrent abdominal pain for many years.  Episodes occur about once every 4 to 6 months.  More recently since the birth of her child 6 months ago.  In between episodes of pain she feels perfectly well.  She describes right upper quadrant discomfort with radiation into the right back followed by nausea with vomiting.  Her most recent emergency room visit was 4 days ago.  I have reviewed that encounter, laboratories, and x-rays.  CBC was normal essentially.  Comprehensive metabolic panel revealed AST of 63, ALT 95, alkaline phosphatase 75, and bilirubin 0.6.  CT scan of the abdomen and pelvis revealed mild gallbladder distention without cholecystitis.  Abdominal ultrasound revealed cholelithiasis without changes of cholecystitis.  The common bile duct was 8 mm.  No common bile duct stone seen.  The patient is feeling better at this time.  She requests paperwork be filled out so that she may return to her work duties.  She has not had prior evaluation for this problem aside from emergency room visits  REVIEW OF SYSTEMS:  All non-GI ROS negative unless otherwise stated in the HPI except for anxiety, headaches  Past Medical History:  Diagnosis Date  . Anxiety   . Depression    on prozac in the past  . Drug addiction (HCC)    subutex  . Headache   . Hepatitis    hepatitis C  . Kidney stones   . MRSA infection 2010,2013  . Ovarian cyst     Past Surgical History:  Procedure Laterality Date  . CESAREAN SECTION N/A 06/08/2017   Procedure: CESAREAN SECTION;  Surgeon: Geryl Rankins, MD;  Location: [facility] BIRTHING SUITES;   Service: Obstetrics;  Laterality: N/A;  . DILATION AND EVACUATION N/A 05/14/2016   Procedure: DILATATION AND EVACUATION (D&E) 2ND TRIMESTER WITH ULTRA SOUND GUIDANCE;  Surgeon: Tereso Newcomer, MD;  Location: [facility] ORS;  Service: Gynecology;  Laterality: N/A;  . miscarriage    . TOOTH EXTRACTION      Social History [name]  reports that she has never smoked. She has never used smokeless tobacco. She reports that she has current or past drug history. Drug: Marijuana. She reports that she does not drink alcohol.  family history includes Diabetes in her mother; Heart disease in her maternal grandfather; Hypertension in her mother; Kidney cancer in her maternal grandmother; Lung cancer in her paternal grandmother.  No Known Allergies     PHYSICAL EXAMINATION: Vital signs: BP 120/70 (BP Location: Left Arm, Patient Position: Sitting, Cuff Size: Large)   Pulse (!) 56   Ht 5' 3.25" (1.607 m) Comment: height measured without shoes  Wt 247 lb (112 kg)   LMP 12/08/2017   Breastfeeding? No   BMI 43.41 kg/m   Constitutional: Pleasant, generally well-appearing, no acute distress.  Obese Psychiatric: alert and oriented x3, cooperative Eyes: extraocular movements intact, anicteric, conjunctiva pink Mouth: oral pharynx moist, no lesions Neck: supple no lymphadenopathy Cardiovascular: heart regular rate and rhythm, no murmur Lungs: clear to auscultation bilaterally Abdomen: soft, obese, nontender, nondistended, no obvious ascites, no peritoneal signs, normal bowel sounds, no organomegaly Rectal: Omitted Extremities: no clubbing, cyanosis, or lower  extremity edema bilaterally Skin: no lesions on visible extremities Neuro: No focal deficits.  Cranial nerves intact  ASSESSMENT:  1.  Symptomatic cholelithiasis to explain recurrent right upper quadrant pain, nausea, and vomiting.  May have had episodes of choledocholithiasis though no evidence of such on imaging. 2.  Mild elevation of hepatic  transaminases.  Normal alkaline phosphatase and bilirubin. 3.  Morbid obesity   PLAN:  1.  General surgical referral for laparoscopic cholecystectomy with IOC 2.  I discussed with the patient today gallstone disease including disease limited to the gallbladder as well as choledocholithiasis and its complications and management including the possible need for ERCP should IOC be positive.  I also provided a diagram for educational review 3.  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

## 2018-01-02 NOTE — Patient Instructions (Signed)
You will be contacted by [facility] to schedule an appointment 

## 2018-01-08 ENCOUNTER — Ambulatory Visit: Payer: Self-pay | Admitting: General Surgery

## 2018-01-08 NOTE — H&P (Signed)
Abigail Fox Documented: 01/08/2018 10:50 AM Location: [facility] Patient #: [id number] DOB: 06/26/1985 Married / Language: English / Race: White Female   History of Present Illness (Jaymes Revels M. Abhishek Levesque MD; 01/08/2018 11:20 AM) The patient is a 32 year old female who presents for evaluation of gall stones. She is referred by Dr Perry for evaluation of RUQ pain and gallstones. She reports a several year history of intermittent right upper quadrant pain rating to her back. It was gently last few hours pending go away. She described it as flairs. It was gently occur after eating. However ever since the birth of her daughter the discomfort is pretty much always there but she will have attacks where the pain will worsen. She had an episode so severe a few days ago and prompted her to go to the emergency room. She had a CT scan which just showed stool burden and gallstones. She had an abdominal ultrasound which showed dilated common bile duct of 8 mm and gallstones without any evidence of cholecystitis. AST was 63 and ALT was 95. Otherwise blood work was unremarkable. She states that she was given oxycodone and nausea medicine. Tylenol does not help her control the flairs. Oxycodone his health that she is out. She states that she has modified her diet but is still having ongoing issues with right upper quadrant pain radiating to her back. She does have chronic constipation. She's had a C-section. She denies any fever or chills. She did have an episode of vomiting on Sunday. She generally gets nausea with these episodes. She denies any jaundice. She denies any shocks of breath, chest pain, chest pressure. She is accompanied by her husband.   Problem List/Past Medical (Zamari Vea M. Sabrea Sankey, MD; 01/08/2018 11:21 AM) SYMPTOMATIC CHOLELITHIASIS (K80.20)   Diagnostic Studies History (Ryenn Howeth M. Arnetra Terris, MD; 01/08/2018 11:08 AM) Colonoscopy  never  Allergies (Patricia King, RMA; 01/08/2018  10:51 AM) No Known Drug Allergies [01/08/2018]: Allergies Reconciled   Medication History (Jullianna Gabor M. Kiegan Macaraeg, MD; 01/08/2018 11:21 AM) Buprenorphine HCl (8MG Tab Sublingual, Sublingual) Active. [name] (0.35MG Tablet, Oral) Active. Ondansetron HCl (4MG Tablet, Oral) Active. valACYclovir HCl (500MG Tablet, Oral) Active. Ibuprofen (800MG Tablet, Oral as needed) Active. Multi-Vitamin (Oral) Active. Medications Reconciled oxyCODONE HCl (5MG Tablet, 1 (one) Oral every eight hours, as needed, Taken starting 01/08/2018) Active. Ondansetron (4MG Tablet Disint, 1 (one) Oral every eight hours, as needed, Taken starting 01/08/2018) Active.  Family History (Alija Riano M. Jaquell Seddon, MD; 01/08/2018 11:08 AM) Alcohol Abuse  Father. Depression  Mother. Diabetes Mellitus  Mother. Hypertension  Father, Mother.  Pregnancy / Birth History (Kaan Tosh M. Jakaya Jacobowitz, MD; 01/08/2018 11:08 AM) Age at menarche  12 years. Gravida  3 Length (months) of breastfeeding  3-6 Maternal age  21-25 Para  1  Other Problems (Keara Pagliarulo M. Zariana Strub, MD; 01/08/2018 11:21 AM) Cholelithiasis     Review of Systems (Arthur Speagle M. Drexler Maland MD; 01/08/2018 11:20 AM) HEENT Present- Seasonal Allergies. Not Present- Earache, Hearing Loss, Hoarseness, Nose Bleed, Oral Ulcers, Ringing in the Ears, Sinus Pain, Sore Throat, Visual Disturbances, Wears glasses/contact lenses and Yellow Eyes. Musculoskeletal Present- Back Pain and Joint Stiffness. Not Present- Joint Pain, Muscle Pain, Muscle Weakness and Swelling of Extremities. All other systems negative  Vitals (Patricia King RMA; 01/08/2018 10:51 AM) 01/08/2018 10:50 AM Weight: 243.8 lb Height: 64in Body Surface Area: 2.13 m Body Mass Index: 41.85 kg/m  Temp.: 97.5F  Pulse: 109 (Regular)  BP: 135/82 (Sitting, Left Arm, Standard)       Physical Exam (Berdell Nevitt   M. Dmario Russom MD; 01/08/2018 11:20 AM) General Mental Status-Alert. General Appearance-Consistent with stated  age. Hydration-Well hydrated. Voice-Normal. Note: severe obesity   Head and Neck Head-normocephalic, atraumatic with no lesions or palpable masses. Trachea-midline. Thyroid Gland Characteristics - normal size and consistency.  Eye Eyeball - Bilateral-Extraocular movements intact. Sclera/Conjunctiva - Bilateral-No scleral icterus.  ENMT Ears -Note: normal ext ears.  Mouth and Throat -Note: lips intact.   Chest and Lung Exam Chest and lung exam reveals -quiet, even and easy respiratory effort with no use of accessory muscles and on auscultation, normal breath sounds, no adventitious sounds and normal vocal resonance. Inspection Chest Wall - Normal. Back - normal.  Breast - Did not examine.  Cardiovascular Cardiovascular examination reveals -normal heart sounds, regular rate and rhythm with no murmurs and normal pedal pulses bilaterally.  Abdomen Inspection Inspection of the abdomen reveals - No Hernias. Skin - Scar - no surgical scars. Palpation/Percussion Palpation and Percussion of the abdomen reveal - Soft, Non Tender, No Rebound tenderness, No Rigidity (guarding) and No hepatosplenomegaly. Auscultation Auscultation of the abdomen reveals - Bowel sounds normal.  Peripheral Vascular Upper Extremity Palpation - Pulses bilaterally normal.  Neurologic Neurologic evaluation reveals -alert and oriented x 3 with no impairment of recent or remote memory. Mental Status-Normal.  Neuropsychiatric The patient's mood and affect are described as -normal. Judgment and Insight-insight is appropriate concerning matters relevant to self.  Musculoskeletal Normal Exam - Left-Upper Extremity Strength Normal and Lower Extremity Strength Normal. Normal Exam - Right-Upper Extremity Strength Normal and Lower Extremity Strength Normal.  Lymphatic Head & Neck  General Head & Neck Lymphatics: Bilateral - Description - Normal. Axillary - Did not  examine. Femoral & Inguinal - Did not examine.    Assessment & Plan (Jasmine Maceachern M. Faithlynn Deeley MD; 01/08/2018 11:21 AM) SYMPTOMATIC CHOLELITHIASIS (K80.20) Impression: I believe the patient's symptoms are consistent with gallbladder disease & probable chronic cholecystitis.  We discussed gallbladder disease. The patient was given educational material. We discussed non-operative and operative management. We discussed the signs & symptoms of acute cholecystitis  I discussed laparoscopic cholecystectomy with IOC in detail. The patient was given educational material as well as diagrams detailing the procedure. We discussed the risks and benefits of a laparoscopic cholecystectomy including, but not limited to bleeding, infection, injury to surrounding structures such as the intestine or liver, bile leak, retained gallstones, need to convert to an open procedure, prolonged diarrhea, blood clots such as DVT, common bile duct injury, anesthesia risks, and possible need for additional procedures (ERCP). We discussed the typical post-operative recovery course. I explained that the likelihood of improvement of their symptoms is good.  The patient has elected to proceed with surgery. She was given a refill on oxycodone and Zofran. She was given a patient education handout from the [employer] of [employer] regarding pain control and risk of addiction. The [place] drug databases also reviewed. Current Plans Pt Education - Pamphlet Given - Laparoscopic Gallbladder Surgery: discussed with patient and provided information. Pt Education - NCCSRS: no at risk use: discussed with patient and provided information. Started oxyCODONE HCl 5 MG Oral Tablet, 1 (one) Tablet every eight hours, as needed, #10, 01/08/2018, No Refill. Started Ondansetron 4 MG Oral Tablet Disintegrating, 1 (one) Tablet every eight hours, as needed, #15, 01/08/2018, No Refill.  Charlayne Vultaggio M. Gumecindo Hopkin, MD, FACS General, Bariatric, & Minimally Invasive  Surgery [facility]  

## 2018-01-08 NOTE — H&P (View-Only) (Signed)
Abigail Fox Documented: 01/08/2018 10:50 AM Location: [facility] Patient #: [id number] DOB: 03/11/1986 Married / Language: Abigail Fox / Race: White Female   History of Present Illness Abigail Areola M. Abigail Kiesel MD; 01/08/2018 11:20 AM) The patient is a 32 year old female who presents for evaluation of gall stones. She is referred by Dr Marina Goodell for evaluation of RUQ pain and gallstones. She reports a several year history of intermittent right upper quadrant pain rating to her back. It was gently last few hours pending go away. She described it as flairs. It was gently occur after eating. However ever since the birth of her daughter the discomfort is pretty much always there but she will have attacks where the pain will worsen. She had an episode so severe a few days ago and prompted her to go to the emergency room. She had a CT scan which just showed stool burden and gallstones. She had an abdominal ultrasound which showed dilated common bile duct of 8 mm and gallstones without any evidence of cholecystitis. AST was 63 and ALT was 95. Otherwise blood work was unremarkable. She states that she was given oxycodone and nausea medicine. Tylenol does not help her control the flairs. Oxycodone his health that she is out. She states that she has modified her diet but is still having ongoing issues with right upper quadrant pain radiating to her back. She does have chronic constipation. She's had a C-section. She denies any fever or chills. She did have an episode of vomiting on Sunday. She generally gets nausea with these episodes. She denies any jaundice. She denies any shocks of breath, chest pain, chest pressure. She is accompanied by her husband.   Problem List/Past Medical Abigail Areola M. Abigail Campanile, MD; 01/08/2018 11:21 AM) SYMPTOMATIC CHOLELITHIASIS (K80.20)   Diagnostic Studies History Abigail Areola M. Abigail Campanile, MD; 01/08/2018 11:08 AM) Colonoscopy  never  Allergies Abigail Fox, [place]; 01/08/2018  10:51 AM) No Known Drug Allergies [01/08/2018]: Allergies Reconciled   Medication History Abigail Areola M. Abigail Campanile, MD; 01/08/2018 11:21 AM) Buprenorphine HCl (8MG  Tab Sublingual, Sublingual) Active. Abigail Fox (0.35MG  Tablet, Oral) Active. Ondansetron HCl (4MG  Tablet, Oral) Active. valACYclovir HCl (500MG  Tablet, Oral) Active. Ibuprofen (800MG  Tablet, Oral as needed) Active. Multi-Vitamin (Oral) Active. Medications Reconciled oxyCODONE HCl (5MG  Tablet, 1 (one) Oral every eight hours, as needed, Taken starting 01/08/2018) Active. Ondansetron (4MG  Tablet Disint, 1 (one) Oral every eight hours, as needed, Taken starting 01/08/2018) Active.  Family History Abigail Areola M. Abigail Campanile, MD; 01/08/2018 11:08 AM) Alcohol Abuse  Father. Depression  Mother. Diabetes Mellitus  Mother. Hypertension  Father, Mother.  Pregnancy / Birth History Abigail Areola M. Abigail Campanile, MD; 01/08/2018 11:08 AM) Age at menarche  12 years. Gravida  3 Length (months) of breastfeeding  3-6 Maternal age  90-25 Para  1  Other Problems Abigail Areola M. Abigail Campanile, MD; 01/08/2018 11:21 AM) Cholelithiasis     Review of Systems Abigail Areola M. Abigail Vanwagoner MD; 01/08/2018 11:20 AM) HEENT Present- Seasonal Allergies. Not Present- Earache, Hearing Loss, Hoarseness, Nose Bleed, Oral Ulcers, Ringing in the Ears, Sinus Pain, Sore Throat, Visual Disturbances, Wears glasses/contact lenses and Yellow Eyes. Musculoskeletal Present- Back Pain and Joint Stiffness. Not Present- Joint Pain, Muscle Pain, Muscle Weakness and Swelling of Extremities. All other systems negative  Vitals Abigail Fox RMA; 01/08/2018 10:51 AM) 01/08/2018 10:50 AM Weight: 243.8 lb Height: 64in Body Surface Area: 2.13 m Body Mass Index: 41.85 kg/m  Temp.: 97.52F  Pulse: 109 (Regular)  BP: 135/82 (Sitting, Left Arm, Standard)       Physical Exam Abigail Areola  Abigail Clan MD; 01/08/2018 11:20 AM) General Mental Status-Alert. General Appearance-Consistent with stated  age. Hydration-Well hydrated. Voice-Normal. Note: severe obesity   Head and Neck Head-normocephalic, atraumatic with no lesions or palpable masses. Trachea-midline. Thyroid Gland Characteristics - normal size and consistency.  Eye Eyeball - Bilateral-Extraocular movements intact. Sclera/Conjunctiva - Bilateral-No scleral icterus.  ENMT Ears -Note: normal ext ears.  Mouth and Throat -Note: lips intact.   Chest and Lung Exam Chest and lung exam reveals -quiet, even and easy respiratory effort with no use of accessory muscles and on auscultation, normal breath sounds, no adventitious sounds and normal vocal resonance. Inspection Chest Wall - Normal. Back - normal.  Breast - Did not examine.  Cardiovascular Cardiovascular examination reveals -normal heart sounds, regular rate and rhythm with no murmurs and normal pedal pulses bilaterally.  Abdomen Inspection Inspection of the abdomen reveals - No Hernias. Skin - Scar - no surgical scars. Palpation/Percussion Palpation and Percussion of the abdomen reveal - Soft, Non Tender, No Rebound tenderness, No Rigidity (guarding) and No hepatosplenomegaly. Auscultation Auscultation of the abdomen reveals - Bowel sounds normal.  Peripheral Vascular Upper Extremity Palpation - Pulses bilaterally normal.  Neurologic Neurologic evaluation reveals -alert and oriented x 3 with no impairment of recent or remote memory. Mental Status-Normal.  Neuropsychiatric The patient's mood and affect are described as -normal. Judgment and Insight-insight is appropriate concerning matters relevant to self.  Musculoskeletal Normal Exam - Left-Upper Extremity Strength Normal and Lower Extremity Strength Normal. Normal Exam - Right-Upper Extremity Strength Normal and Lower Extremity Strength Normal.  Lymphatic Head & Neck  General Head & Neck Lymphatics: Bilateral - Description - Normal. Axillary - Did not  examine. Femoral & Inguinal - Did not examine.    Assessment & Plan Abigail Areola M. Abigail Vida MD; 01/08/2018 11:21 AM) SYMPTOMATIC CHOLELITHIASIS (K80.20) Impression: I believe the patient's symptoms are consistent with gallbladder disease & probable chronic cholecystitis.  We discussed gallbladder disease. The patient was given Agricultural engineer. We discussed non-operative and operative management. We discussed the signs & symptoms of acute cholecystitis  I discussed laparoscopic cholecystectomy with IOC in detail. The patient was given educational material as well as diagrams detailing the procedure. We discussed the risks and benefits of a laparoscopic cholecystectomy including, but not limited to bleeding, infection, injury to surrounding structures such as the intestine or liver, bile leak, retained gallstones, need to convert to an open procedure, prolonged diarrhea, blood clots such as DVT, common bile duct injury, anesthesia risks, and possible need for additional procedures (ERCP). We discussed the typical post-operative recovery course. I explained that the likelihood of improvement of their symptoms is good.  The patient has elected to proceed with surgery. She was given a refill on oxycodone and Zofran. She was given a patient education handout from the [employer] of surgeons regarding pain control and risk of addiction. The [place] drug databases also reviewed. Current Plans Pt Education - Pamphlet Given - Laparoscopic Gallbladder Surgery: discussed with patient and provided information. Pt Education - NCCSRS: no at risk use: discussed with patient and provided information. Started oxyCODONE HCl 5 MG Oral Tablet, 1 (one) Tablet every eight hours, as needed, #10, 01/08/2018, No Refill. Started Ondansetron 4 MG Oral Tablet Disintegrating, 1 (one) Tablet every eight hours, as needed, #15, 01/08/2018, No Refill.  Mary Sella. Abigail Campanile, MD, FACS General, Bariatric, & Minimally Invasive  Surgery [facility], [place]

## 2018-01-14 ENCOUNTER — Other Ambulatory Visit: Payer: Self-pay

## 2018-01-14 ENCOUNTER — Encounter (HOSPITAL_COMMUNITY): Payer: Self-pay | Admitting: *Deleted

## 2018-01-15 ENCOUNTER — Encounter (HOSPITAL_COMMUNITY): Payer: Self-pay | Admitting: *Deleted

## 2018-01-15 ENCOUNTER — Ambulatory Visit (HOSPITAL_COMMUNITY): Payer: Managed Care, Other (non HMO)

## 2018-01-15 ENCOUNTER — Ambulatory Visit (HOSPITAL_COMMUNITY)
Admission: RE | Admit: 2018-01-15 | Discharge: 2018-01-15 | Disposition: A | Discharge status: Home / Self Care | Payer: Managed Care, Other (non HMO) | Source: Ambulatory Visit | Attending: General Surgery | Admitting: General Surgery

## 2018-01-15 ENCOUNTER — Ambulatory Visit (HOSPITAL_COMMUNITY): Payer: Managed Care, Other (non HMO) | Admitting: Anesthesiology

## 2018-01-15 ENCOUNTER — Encounter (HOSPITAL_COMMUNITY)
Admission: RE | Disposition: A | Discharge status: Home / Self Care | Payer: Self-pay | Source: Ambulatory Visit | Attending: General Surgery

## 2018-01-15 DIAGNOSIS — Z833 Family history of diabetes mellitus: Secondary | ICD-10-CM | POA: Insufficient documentation

## 2018-01-15 DIAGNOSIS — F419 Anxiety disorder, unspecified: Secondary | ICD-10-CM | POA: Insufficient documentation

## 2018-01-15 DIAGNOSIS — K828 Other specified diseases of gallbladder: Secondary | ICD-10-CM | POA: Diagnosis not present

## 2018-01-15 DIAGNOSIS — K8020 Calculus of gallbladder without cholecystitis without obstruction: Secondary | ICD-10-CM | POA: Diagnosis present

## 2018-01-15 DIAGNOSIS — Z8249 Family history of ischemic heart disease and other diseases of the circulatory system: Secondary | ICD-10-CM | POA: Insufficient documentation

## 2018-01-15 DIAGNOSIS — Z6841 Body Mass Index (BMI) 40.0 and over, adult: Secondary | ICD-10-CM | POA: Diagnosis not present

## 2018-01-15 DIAGNOSIS — Z818 Family history of other mental and behavioral disorders: Secondary | ICD-10-CM | POA: Diagnosis not present

## 2018-01-15 DIAGNOSIS — Z79899 Other long term (current) drug therapy: Secondary | ICD-10-CM | POA: Insufficient documentation

## 2018-01-15 DIAGNOSIS — K8010 Calculus of gallbladder with chronic cholecystitis without obstruction: Secondary | ICD-10-CM | POA: Insufficient documentation

## 2018-01-15 DIAGNOSIS — Z811 Family history of alcohol abuse and dependence: Secondary | ICD-10-CM | POA: Diagnosis not present

## 2018-01-15 HISTORY — PX: CHOLECYSTECTOMY: SHX55

## 2018-01-15 LAB — PREGNANCY, URINE: Preg Test, Ur: NEGATIVE

## 2018-01-15 LAB — COMPREHENSIVE METABOLIC PANEL
ALBUMIN: 4.2 g/dL (ref 3.5–5.0)
ALT: 69 U/L — AB (ref 0–44)
ANION GAP: 11 (ref 5–15)
AST: 44 U/L — ABNORMAL HIGH (ref 15–41)
Alkaline Phosphatase: 69 U/L (ref 38–126)
BUN: 11 mg/dL (ref 6–20)
CALCIUM: 9.3 mg/dL (ref 8.9–10.3)
CHLORIDE: 104 mmol/L (ref 98–111)
CO2: 25 mmol/L (ref 22–32)
CREATININE: 0.82 mg/dL (ref 0.44–1.00)
GFR calc non Af Amer: >60 mL/min (ref >60)
GLUCOSE: 93 mg/dL (ref 70–99)
Potassium: 3.8 mmol/L (ref 3.5–5.1)
SODIUM: 140 mmol/L (ref 135–145)
TOTAL PROTEIN: 8.2 g/dL — AB (ref 6.5–8.1)
Total Bilirubin: 1 mg/dL (ref 0.3–1.2)

## 2018-01-15 SURGERY — LAPAROSCOPIC CHOLECYSTECTOMY WITH INTRAOPERATIVE CHOLANGIOGRAM
Anesthesia: General | Site: Abdomen

## 2018-01-15 MED ORDER — SODIUM CHLORIDE 0.9 % IV SOLN
2.0000 g | INTRAVENOUS | Status: AC
  Administered 2018-01-15: 2 g via INTRAVENOUS
  Filled 2018-01-15: qty 2

## 2018-01-15 MED ORDER — GABAPENTIN 300 MG PO CAPS
300.0000 mg | ORAL_CAPSULE | ORAL | Status: AC
  Administered 2018-01-15: 300 mg via ORAL
  Filled 2018-01-15: qty 1

## 2018-01-15 MED ORDER — ONDANSETRON HCL 4 MG/2ML IJ SOLN
INTRAMUSCULAR | Status: AC
  Filled 2018-01-15: qty 2

## 2018-01-15 MED ORDER — SUGAMMADEX SODIUM 500 MG/5ML IV SOLN
INTRAVENOUS | Status: AC
  Filled 2018-01-15: qty 5

## 2018-01-15 MED ORDER — PROPOFOL 10 MG/ML IV BOLUS
INTRAVENOUS | Status: AC
  Filled 2018-01-15: qty 20

## 2018-01-15 MED ORDER — LACTATED RINGERS IV SOLN
INTRAVENOUS | Status: DC
  Administered 2018-01-15: 08:00:00 via INTRAVENOUS

## 2018-01-15 MED ORDER — 0.9 % SODIUM CHLORIDE (POUR BTL) OPTIME
TOPICAL | Status: DC | PRN
  Administered 2018-01-15: 1000 mL

## 2018-01-15 MED ORDER — BUPIVACAINE-EPINEPHRINE (PF) 0.5% -1:200000 IJ SOLN
INTRAMUSCULAR | Status: AC
  Filled 2018-01-15: qty 30

## 2018-01-15 MED ORDER — PROMETHAZINE HCL 25 MG/ML IJ SOLN
INTRAMUSCULAR | Status: AC
  Filled 2018-01-15: qty 1

## 2018-01-15 MED ORDER — ACETAMINOPHEN 500 MG PO TABS
1000.0000 mg | ORAL_TABLET | ORAL | Status: AC
  Administered 2018-01-15: 1000 mg via ORAL
  Filled 2018-01-15: qty 2

## 2018-01-15 MED ORDER — IOPAMIDOL (ISOVUE-300) INJECTION 61%
INTRAVENOUS | Status: AC
  Filled 2018-01-15: qty 50

## 2018-01-15 MED ORDER — OXYCODONE HCL 5 MG PO TABS: 5.0000 mg | ORAL_TABLET | Freq: Three times a day (TID) | ORAL | 0 refills | Status: DC | PRN

## 2018-01-15 MED ORDER — FENTANYL CITRATE (PF) 100 MCG/2ML IJ SOLN
INTRAMUSCULAR | Status: AC
  Filled 2018-01-15: qty 2

## 2018-01-15 MED ORDER — SCOPOLAMINE 1 MG/3DAYS TD PT72
MEDICATED_PATCH | TRANSDERMAL | Status: AC
  Filled 2018-01-15: qty 1

## 2018-01-15 MED ORDER — MIDAZOLAM HCL 2 MG/2ML IJ SOLN
INTRAMUSCULAR | Status: AC
  Filled 2018-01-15: qty 2

## 2018-01-15 MED ORDER — LIDOCAINE HCL (CARDIAC) PF 100 MG/5ML IV SOSY
PREFILLED_SYRINGE | INTRAVENOUS | Status: AC
  Filled 2018-01-15: qty 5

## 2018-01-15 MED ORDER — PROPOFOL 10 MG/ML IV BOLUS
INTRAVENOUS | Status: DC | PRN
  Administered 2018-01-15: 200 mg via INTRAVENOUS

## 2018-01-15 MED ORDER — DEXMEDETOMIDINE HCL 200 MCG/2ML IV SOLN
INTRAVENOUS | Status: DC | PRN
  Administered 2018-01-15: 8 ug via INTRAVENOUS

## 2018-01-15 MED ORDER — CHLORHEXIDINE GLUCONATE CLOTH 2 % EX PADS: 6.0000 | MEDICATED_PAD | Freq: Once | CUTANEOUS | Status: DC

## 2018-01-15 MED ORDER — FENTANYL CITRATE (PF) 100 MCG/2ML IJ SOLN
25.0000 ug | INTRAMUSCULAR | Status: DC | PRN
  Administered 2018-01-15 (×3): 50 ug via INTRAVENOUS

## 2018-01-15 MED ORDER — ROCURONIUM BROMIDE 10 MG/ML (PF) SYRINGE
PREFILLED_SYRINGE | INTRAVENOUS | Status: DC | PRN
  Administered 2018-01-15: 80 mg via INTRAVENOUS

## 2018-01-15 MED ORDER — ACETAMINOPHEN 10 MG/ML IV SOLN: 1000.0000 mg | Freq: Once | INTRAVENOUS | Status: DC | PRN

## 2018-01-15 MED ORDER — DEXAMETHASONE SODIUM PHOSPHATE 10 MG/ML IJ SOLN
INTRAMUSCULAR | Status: AC
  Filled 2018-01-15: qty 1

## 2018-01-15 MED ORDER — LACTATED RINGERS IR SOLN
Status: DC | PRN
  Administered 2018-01-15: 1000 mL

## 2018-01-15 MED ORDER — FENTANYL CITRATE (PF) 100 MCG/2ML IJ SOLN
INTRAMUSCULAR | Status: DC | PRN
  Administered 2018-01-15: 100 ug via INTRAVENOUS
  Administered 2018-01-15: 50 ug via INTRAVENOUS
  Administered 2018-01-15: 100 ug via INTRAVENOUS
  Administered 2018-01-15 (×2): 50 ug via INTRAVENOUS

## 2018-01-15 MED ORDER — PROMETHAZINE HCL 25 MG/ML IJ SOLN
6.2500 mg | INTRAMUSCULAR | Status: AC | PRN
  Administered 2018-01-15 (×2): 12.5 mg via INTRAVENOUS

## 2018-01-15 MED ORDER — BUPIVACAINE-EPINEPHRINE (PF) 0.5% -1:200000 IJ SOLN
INTRAMUSCULAR | Status: DC | PRN
  Administered 2018-01-15: 30 mL

## 2018-01-15 MED ORDER — FENTANYL CITRATE (PF) 250 MCG/5ML IJ SOLN
INTRAMUSCULAR | Status: AC
  Filled 2018-01-15: qty 5

## 2018-01-15 MED ORDER — DEXAMETHASONE SODIUM PHOSPHATE 10 MG/ML IJ SOLN
INTRAMUSCULAR | Status: DC | PRN
  Administered 2018-01-15: 5 mg via INTRAVENOUS

## 2018-01-15 MED ORDER — LIDOCAINE 2% (20 MG/ML) 5 ML SYRINGE
INTRAMUSCULAR | Status: DC | PRN
  Administered 2018-01-15: 60 mg via INTRAVENOUS

## 2018-01-15 MED ORDER — IOPAMIDOL (ISOVUE-300) INJECTION 61%
INTRAVENOUS | Status: DC | PRN
  Administered 2018-01-15: 3 mL

## 2018-01-15 MED ORDER — SUGAMMADEX SODIUM 200 MG/2ML IV SOLN
INTRAVENOUS | Status: DC | PRN
  Administered 2018-01-15: 450 mg via INTRAVENOUS

## 2018-01-15 MED ORDER — ONDANSETRON HCL 4 MG/2ML IJ SOLN
INTRAMUSCULAR | Status: DC | PRN
  Administered 2018-01-15: 4 mg via INTRAVENOUS

## 2018-01-15 MED ORDER — SCOPOLAMINE 1 MG/3DAYS TD PT72
MEDICATED_PATCH | TRANSDERMAL | Status: DC | PRN
  Administered 2018-01-15: 1.5 mg via TRANSDERMAL

## 2018-01-15 MED ORDER — HYDROMORPHONE HCL 1 MG/ML IJ SOLN
0.2500 mg | INTRAMUSCULAR | Status: DC | PRN
  Administered 2018-01-15 (×2): 0.5 mg via INTRAVENOUS

## 2018-01-15 MED ORDER — HYDROMORPHONE HCL 1 MG/ML IJ SOLN
INTRAMUSCULAR | Status: AC
  Filled 2018-01-15: qty 1

## 2018-01-15 SURGICAL SUPPLY — 49 items
APPLICATOR ARISTA FLEXITIP XL (MISCELLANEOUS) IMPLANT
APPLIER CLIP 5 13 M/L LIGAMAX5 (MISCELLANEOUS) ×3
APPLIER CLIP ROT 10 11.4 M/L (STAPLE)
BANDAGE ADH SHEER 1  50/CT (GAUZE/BANDAGES/DRESSINGS) ×12 IMPLANT
BENZOIN TINCTURE PRP APPL 2/3 (GAUZE/BANDAGES/DRESSINGS) ×3 IMPLANT
CABLE HIGH FREQUENCY MONO STRZ (ELECTRODE) ×3 IMPLANT
CHLORAPREP W/TINT 26ML (MISCELLANEOUS) ×3 IMPLANT
CLIP APPLIE 5 13 M/L LIGAMAX5 (MISCELLANEOUS) ×1 IMPLANT
CLIP APPLIE ROT 10 11.4 M/L (STAPLE) IMPLANT
CLIP VESOLOCK MED LG 6/CT (CLIP) IMPLANT
CLOSURE WOUND 1/2 X4 (GAUZE/BANDAGES/DRESSINGS) ×1
COVER MAYO STAND STRL (DRAPES) ×3 IMPLANT
COVER SURGICAL LIGHT HANDLE (MISCELLANEOUS) ×3 IMPLANT
COVER WAND RF STERILE (DRAPES) ×3 IMPLANT
DECANTER SPIKE VIAL GLASS SM (MISCELLANEOUS) ×3 IMPLANT
DERMABOND ADVANCED (GAUZE/BANDAGES/DRESSINGS)
DERMABOND ADVANCED .7 DNX12 (GAUZE/BANDAGES/DRESSINGS) IMPLANT
DRAPE C-ARM 42X120 X-RAY (DRAPES) ×3 IMPLANT
DRSG TEGADERM 2-3/8X2-3/4 SM (GAUZE/BANDAGES/DRESSINGS) IMPLANT
ELECT PENCIL ROCKER SW 15FT (MISCELLANEOUS) IMPLANT
ELECT REM PT RETURN 15FT ADLT (MISCELLANEOUS) ×3 IMPLANT
GAUZE SPONGE 2X2 8PLY STRL LF (GAUZE/BANDAGES/DRESSINGS) IMPLANT
GLOVE BIO SURGEON STRL SZ7.5 (GLOVE) ×3 IMPLANT
GLOVE INDICATOR 8.0 STRL GRN (GLOVE) ×3 IMPLANT
GOWN STRL REUS W/TWL XL LVL3 (GOWN DISPOSABLE) ×9 IMPLANT
GRASPER SUT TROCAR 14GX15 (MISCELLANEOUS) IMPLANT
HEMOSTAT ARISTA ABSORB 3G PWDR (MISCELLANEOUS) IMPLANT
HEMOSTAT SNOW SURGICEL 2X4 (HEMOSTASIS) IMPLANT
KIT BASIN OR (CUSTOM PROCEDURE TRAY) ×3 IMPLANT
L-HOOK LAP DISP 36CM (ELECTROSURGICAL)
LHOOK LAP DISP 36CM (ELECTROSURGICAL) IMPLANT
POUCH RETRIEVAL ECOSAC 10 (ENDOMECHANICALS) ×1 IMPLANT
POUCH RETRIEVAL ECOSAC 10MM (ENDOMECHANICALS) ×2
SCISSORS LAP 5X35 DISP (ENDOMECHANICALS) ×3 IMPLANT
SET CHOLANGIOGRAPH MIX (MISCELLANEOUS) IMPLANT
SET IRRIG TUBING LAPAROSCOPIC (IRRIGATION / IRRIGATOR) ×3 IMPLANT
SLEEVE XCEL OPT CAN 5 100 (ENDOMECHANICALS) ×6 IMPLANT
SPONGE GAUZE 2X2 STER 10/PKG (GAUZE/BANDAGES/DRESSINGS)
STRIP CLOSURE SKIN 1/2X4 (GAUZE/BANDAGES/DRESSINGS) ×2 IMPLANT
SUT MNCRL AB 4-0 PS2 18 (SUTURE) ×3 IMPLANT
SUT VICRYL 0 TIES 12 18 (SUTURE) IMPLANT
SUT VICRYL 0 UR6 27IN ABS (SUTURE) IMPLANT
TOWEL OR 17X26 10 PK STRL BLUE (TOWEL DISPOSABLE) ×3 IMPLANT
TOWEL OR NON WOVEN STRL DISP B (DISPOSABLE) ×3 IMPLANT
TRAY LAPAROSCOPIC (CUSTOM PROCEDURE TRAY) ×3 IMPLANT
TROCAR BLADELESS OPT 5 100 (ENDOMECHANICALS) ×3 IMPLANT
TROCAR XCEL BLUNT TIP 100MML (ENDOMECHANICALS) IMPLANT
TROCAR XCEL NON-BLD 11X100MML (ENDOMECHANICALS) IMPLANT
TUBING INSUF HEATED (TUBING) ×3 IMPLANT

## 2018-01-15 NOTE — Anesthesia Procedure Notes (Signed)
Procedure Name: Intubation Performed by: Gean Maidens, CRNA Pre-anesthesia Checklist: Patient identified, Emergency Drugs available, Suction available, Patient being monitored and Timeout performed Patient Re-evaluated:Patient Re-evaluated prior to induction Oxygen Delivery Method: Circle system utilized Preoxygenation: Pre-oxygenation with 100% oxygen Induction Type: IV induction Ventilation: Mask ventilation without difficulty Laryngoscope Size: Mac and 4 Grade View: Grade I Tube type: Oral Tube size: 7.0 mm Number of attempts: 1 Airway Equipment and Method: Stylet Placement Confirmation: ETT inserted through vocal cords under direct vision,  positive ETCO2 and breath sounds checked- equal and bilateral Secured at: 21 cm Tube secured with: Tape Dental Injury: Teeth and Oropharynx as per pre-operative assessment

## 2018-01-15 NOTE — Anesthesia Preprocedure Evaluation (Addendum)
Anesthesia Evaluation  Patient identified by MRN, date of birth, ID band Patient awake    Reviewed: Allergy & Precautions, NPO status , Patient's Chart, lab work & pertinent test results  History of Anesthesia Complications Negative for: history of anesthetic complications  Airway Mallampati: III  TM Distance: >3 FB Neck ROM: Full    Dental no notable dental hx. (+) Teeth Intact, Dental Advisory Given   Pulmonary neg pulmonary ROS,    Pulmonary exam normal breath sounds clear to auscultation       Cardiovascular negative cardio ROS Normal cardiovascular exam Rhythm:Regular Rate:Normal     Neuro/Psych Anxiety negative neurological ROS  negative psych ROS   GI/Hepatic negative GI ROS, Neg liver ROS, (+) Hepatitis -, C  Endo/Other  Morbid obesity  Renal/GU negative Renal ROS  negative genitourinary   Musculoskeletal negative musculoskeletal ROS (+)   Abdominal   Peds negative pediatric ROS (+)  Hematology negative hematology ROS (+)   Anesthesia Other Findings   Reproductive/Obstetrics negative OB ROS                            Anesthesia Physical Anesthesia Plan  ASA: III  Anesthesia Plan: General   Post-op Pain Management:    Induction: Intravenous  PONV Risk Score and Plan: 3 and Ondansetron, Dexamethasone, Treatment may vary due to age or medical condition and Scopolamine patch - Pre-op  Airway Management Planned: Oral ETT  Additional Equipment:   Intra-op Plan:   Post-operative Plan: Extubation in OR  Informed Consent: I have reviewed the patients History and Physical, chart, labs and discussed the procedure including the risks, benefits and alternatives for the proposed anesthesia with the patient or authorized representative who has indicated his/her understanding and acceptance.   Dental advisory given  Plan Discussed with: CRNA and Surgeon  Anesthesia Plan  Comments:        Anesthesia Quick Evaluation

## 2018-01-15 NOTE — Transfer of Care (Signed)
Immediate Anesthesia Transfer of Care Note  Patient: Abigail Fox  Procedure(s) Performed: LAPAROSCOPIC CHOLECYSTECTOMY WITH INTRAOPERATIVE CHOLANGIOGRAM ERAS PATHWAY (N/A Abdomen)  Patient Location: PACU  Anesthesia Type:General  Level of Consciousness: awake, alert  and oriented  Airway & Oxygen Therapy: Patient Spontanous Breathing and Patient connected to face mask oxygen  Post-op Assessment: Report given to RN and Post -op Vital signs reviewed and stable  Post vital signs: Reviewed and stable  Last Vitals:  Vitals Value Taken Time  BP    Temp    Pulse    Resp    SpO2      Last Pain:  Vitals:   01/15/18 0802  TempSrc:   PainSc: 3          Complications: No apparent anesthesia complications

## 2018-01-15 NOTE — Op Note (Signed)
Abigail Fox [id number] Apr 16, 1985 01/15/2018  Laparoscopic Cholecystectomy with IOC Procedure Note  Indications: This patient presents with symptomatic gallbladder disease and will undergo laparoscopic cholecystectomy.  Pre-operative Diagnosis: symptomatic cholelithiasis  Post-operative Diagnosis: Same  Surgeon: Gaynelle Adu MD FACS  Assistants: Bailey Mech PA-C  Anesthesia: General endotracheal anesthesia  Procedure Details  The patient was seen again in the Holding Room. The risks, benefits, complications, treatment options, and expected outcomes were discussed with the patient. The possibilities of reaction to medication, pulmonary aspiration, perforation of viscus, bleeding, recurrent infection, finding a normal gallbladder, the need for additional procedures, failure to diagnose a condition, the possible need to convert to an open procedure, and creating a complication requiring transfusion or operation were discussed with the patient. The likelihood of improving the patient's symptoms with return to their baseline status is good.  The patient and/or family concurred with the proposed plan, giving informed consent. The site of surgery properly noted. The patient was taken to Operating Room, identified as Abigail Fox and the procedure verified as Laparoscopic Cholecystectomy with Intraoperative Cholangiogram. A Time Out was held and the above information confirmed. Antibiotic prophylaxis was administered.   Prior to the induction of general anesthesia, antibiotic prophylaxis was administered. General endotracheal anesthesia was then administered and tolerated well. After the induction, the abdomen was prepped with Chloraprep and draped in the sterile fashion. The patient was positioned in the supine position.  Because of her obesity I decided to gain access to the abdomen using the Optiview technique.  A small left subcostal incision was made within the lumbar 11 blade.  Then using a 0  degree 5 mm laparoscope through a 5 mm trocar I advanced it through all layers of the abdominal wall under direct visualization and entered the abdominal cavity.  Pneumoperitoneum was established without any change in patient vital signs.  The abdominal cavity was surveilled.  There is no evidence of injury to surrounding structures.  The patient was placed in reverse Trendelenburg and rotated to the left.  A supraumbilical incision was made and an 11 mm trocar was placed.  Two 5 mm trochars were placed in the right abdomen under direct visualization after local had been placed.   We positioned the patient in reverse Trendelenburg, tilted slightly to the patient's left.  The gallbladder was identified, the fundus grasped and retracted cephalad. Adhesions were lysed bluntly and with the electrocautery where indicated, taking care not to injure any adjacent organs or viscus. The infundibulum was grasped and retracted laterally, exposing the peritoneum overlying the triangle of Calot. This was then divided and exposed in a blunt fashion. A critical view of the cystic duct and cystic artery was obtained.  The cystic duct was clearly identified and bluntly dissected circumferentially. The cystic duct was ligated with a clip distally.   An incision was made in the cystic duct and the [facility] cholangiogram catheter introduced. The catheter was secured using a clip. A cholangiogram was then obtained which showed good visualization of the distal and proximal biliary tree with no sign of filling defects or obstruction.  Contrast flowed easily into the duodenum. The catheter was then removed.   The cystic duct was then ligated with clips and divided. The cystic artery which had been identified & dissected free was ligated with clips and divided as well.   The gallbladder was dissected from the liver bed in retrograde fashion with the electrocautery. The gallbladder was removed and placed in an Ecco sac.  The gallbladder  and  Ecco sac were then removed through the umbilical port site. The liver bed was irrigated and inspected. Hemostasis was achieved with the electrocautery. Copious irrigation was utilized and was repeatedly aspirated until clear.  Using the PMI suture passer I placed 4 interrupted 0 Vicryl sutures at the supraumbilical incision to close the fascial defect.  There is no palpable defect.  There was no air leak.  We again inspected the right upper quadrant for hemostasis.  The umbilical closure was inspected and there was no air leak and nothing trapped within the closure. Pneumoperitoneum was released as we removed the trocars.  4-0 Monocryl was used to close the skin.   Benzoin, steri-strips, and clean dressings were applied. The patient was then extubated and brought to the recovery room in stable condition. Instrument, sponge, and needle counts were correct at closure and at the conclusion of the case.   Findings: Cholelithiasis; +critical view; normal ioc  Estimated Blood Loss: Minimal         Drains: none         Specimens: Gallbladder           Complications: None; patient tolerated the procedure well.         Disposition: PACU - hemodynamically stable.         Condition: stable  Mary Sella. [name], MD, FACS General, Bariatric, & Minimally Invasive Surgery [facility], [place]

## 2018-01-15 NOTE — Discharge Instructions (Signed)
[facility] °LAPAROSCOPIC SURGERY: POST OP INSTRUCTIONS °Always review your discharge instruction sheet given to you by the facility where your surgery was performed. °IF YOU HAVE DISABILITY OR FAMILY LEAVE FORMS, YOU MUST BRING THEM TO THE OFFICE FOR PROCESSING.   °DO NOT GIVE THEM TO YOUR DOCTOR. ° °PAIN CONTROL ° °1. First take acetaminophen (Tylenol) AND/or ibuprofen (Advil) to control your pain after surgery.  Follow directions on package.  Taking acetaminophen (Tylenol) and/or ibuprofen (Advil) regularly after surgery will help to control your pain and lower the amount of prescription pain medication you may need.  You should not take more than 4,000 mg (4 grams) of acetaminophen (Tylenol) in 24 hours.  You should not take ibuprofen (Advil), aleve, motrin, naprosyn or other NSAIDS if you have a history of stomach ulcers or chronic kidney disease.  °2. A prescription for pain medication may be given to you upon discharge.  Take your pain medication as prescribed, if you still have uncontrolled pain after taking acetaminophen (Tylenol) or ibuprofen (Advil). °3. Use ice packs to help control pain. °4. If you need a refill on your pain medication, please contact your pharmacy.  They will contact our office to request authorization. Prescriptions will not be filled after 5pm or on week-ends. ° °HOME MEDICATIONS °5. Take your usually prescribed medications unless otherwise directed. ° °DIET °6. You should follow a light diet the first few days after arrival home.  Be sure to include lots of fluids daily. Avoid fatty, fried foods.  ° °CONSTIPATION °7. It is common to experience some constipation after surgery and if you are taking pain medication.  Increasing fluid intake and taking a stool softener (such as Colace) will usually help or prevent this problem from occurring.  A mild laxative (Milk of Magnesia or Miralax) should be taken according to package instructions if there are no bowel  movements after 48 hours. ° °WOUND/INCISION CARE °8. Most patients will experience some swelling and bruising in the area of the incisions.  Ice packs will help.  Swelling and bruising can take several days to resolve.  °9. Unless discharge instructions indicate otherwise, follow guidelines below  °a. STERI-STRIPS - you may remove your outer bandages 48 hours after surgery, and you may shower at that time.  You have steri-strips (small skin tapes) in place directly over the incision.  These strips should be left on the skin for 7-10 days.   °b. DERMABOND/SKIN GLUE - you may shower in 24 hours.  The glue will flake off over the next 2-3 weeks. °10. Any sutures or staples will be removed at the office during your follow-up visit. ° °ACTIVITIES °11. You may resume regular (light) daily activities beginning the next day--such as daily self-care, walking, climbing stairs--gradually increasing activities as tolerated.  You may have sexual intercourse when it is comfortable.  Refrain from any heavy lifting or straining until approved by your doctor. °a. You may drive when you are no longer taking prescription pain medication, you can comfortably wear a seatbelt, and you can safely maneuver your car and apply brakes. ° °FOLLOW-UP °12. You should see your doctor in the office for a follow-up appointment approximately 2-3 weeks after your surgery.  You should have been given your post-op/follow-up appointment when your surgery was scheduled.  If you did not receive a post-op/follow-up appointment, make sure that you call for this appointment within a day or two after you arrive home to insure a convenient appointment time. ° °OTHER   INSTRUCTIONS °13.  ° °WHEN TO CALL YOUR DOCTOR: °1. Fever over 101.0 °2. Inability to urinate °3. Continued bleeding from incision. °4. Increased pain, redness, or drainage from the incision. °5. Increasing abdominal pain ° °The clinic staff is available to answer your questions during regular  business hours.  Please don’t hesitate to call and ask to speak to one of the nurses for clinical concerns.  If you have a medical emergency, go to the nearest emergency room or call 911.  A surgeon from [facility] is always on call at the hospital. °[address], [address], [place], [place]  [address] ? [address] [address], [place], [place]   [address] °[phone] ? [phone] ? FAX [phone] °Web site: [email or web address] ° °

## 2018-01-15 NOTE — Anesthesia Postprocedure Evaluation (Signed)
Anesthesia Post Note  Patient: Abigail Fox  Procedure(s) Performed: LAPAROSCOPIC CHOLECYSTECTOMY WITH INTRAOPERATIVE CHOLANGIOGRAM ERAS PATHWAY (N/A Abdomen)     Patient location during evaluation: PACU Anesthesia Type: General Level of consciousness: awake Pain management: pain level controlled Vital Signs Assessment: post-procedure vital signs reviewed and stable Respiratory status: spontaneous breathing, nonlabored ventilation, respiratory function stable and patient connected to nasal cannula oxygen Cardiovascular status: blood pressure returned to baseline and stable Postop Assessment: no apparent nausea or vomiting Anesthetic complications: no    Last Vitals:  Vitals:   01/15/18 0736 01/15/18 1248  BP: (!) 154/94   Pulse: 73   Resp: 16   Temp: 36.9 C 36.7 C  SpO2: 99%     Last Pain:  Vitals:   01/15/18 0802  TempSrc:   PainSc: 3                  Kaylyn Layer

## 2018-01-15 NOTE — Interval H&P Note (Signed)
History and Physical Interval Note:  01/15/2018 10:41 AM  Abigail Fox  has presented today for surgery, with the diagnosis of symptomatic cholelithiasis  The various methods of treatment have been discussed with the patient and family. After consideration of risks, benefits and other options for treatment, the patient has consented to  Procedure(s): LAPAROSCOPIC CHOLECYSTECTOMY WITH INTRAOPERATIVE CHOLANGIOGRAM ERAS PATHWAY (N/A) as a surgical intervention .  The patient's history has been reviewed, patient examined, no change in status, stable for surgery.  I have reviewed the patient's chart and labs.  Questions were answered to the patient's satisfaction.    Mary Sella. [name], MD, FACS General, Bariatric, & Minimally Invasive Surgery [facility] [place]  Gaynelle Adu

## 2018-01-16 ENCOUNTER — Encounter (HOSPITAL_COMMUNITY): Payer: Self-pay | Admitting: General Surgery

## 2018-05-27 DIAGNOSIS — J069 Acute upper respiratory infection, unspecified: Secondary | ICD-10-CM | POA: Diagnosis not present

## 2018-07-07 DIAGNOSIS — R05 Cough: Secondary | ICD-10-CM | POA: Diagnosis not present

## 2018-08-03 IMAGING — US US ABDOMEN LIMITED
1 series · 15 of 25 positions shown · non-contrast
Comparison: None.

CLINICAL DATA: Abdominal pain, nausea, and vomiting. Elevated white
cell count. Hepatitis-C positive. 30 weeks pregnant patient.

EXAM:
ULTRASOUND ABDOMEN LIMITED RIGHT UPPER QUADRANT

[Series 1: us abdomen limited · 15 of 52 slices shown]
[im 1/52]
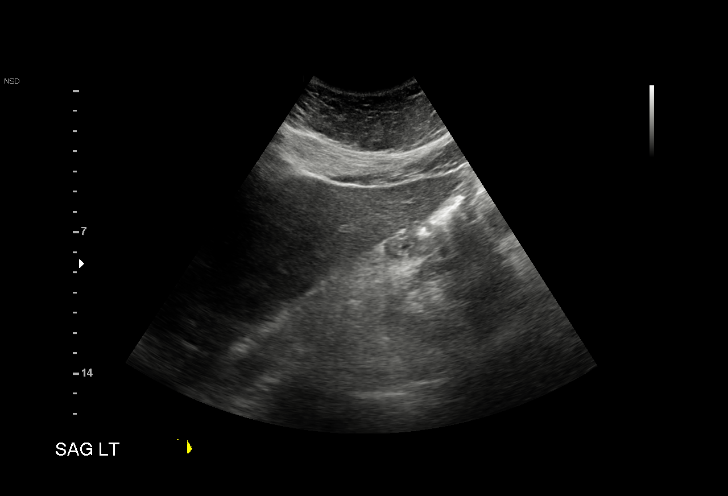
[im 5/52]
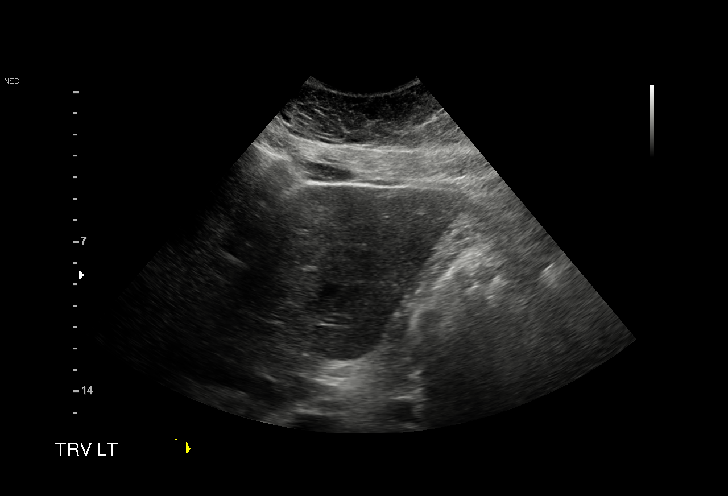
[im 9/52]
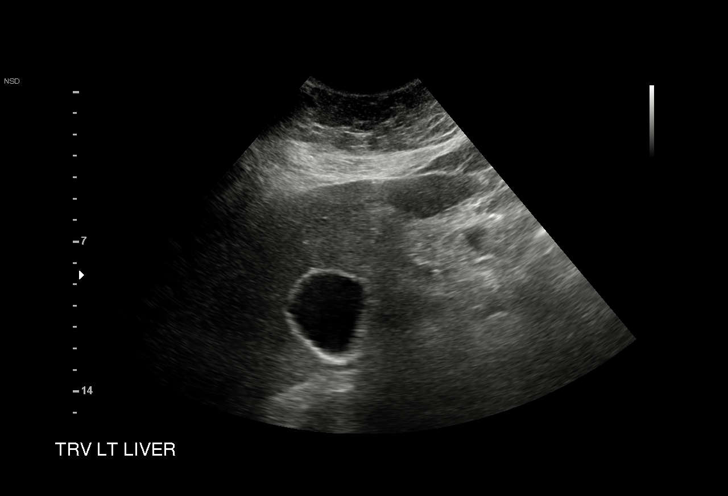
[im 11/52]
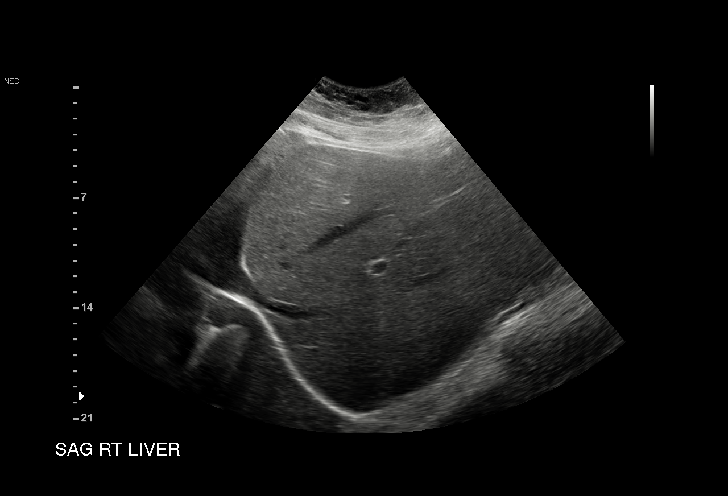
[im 15/52]
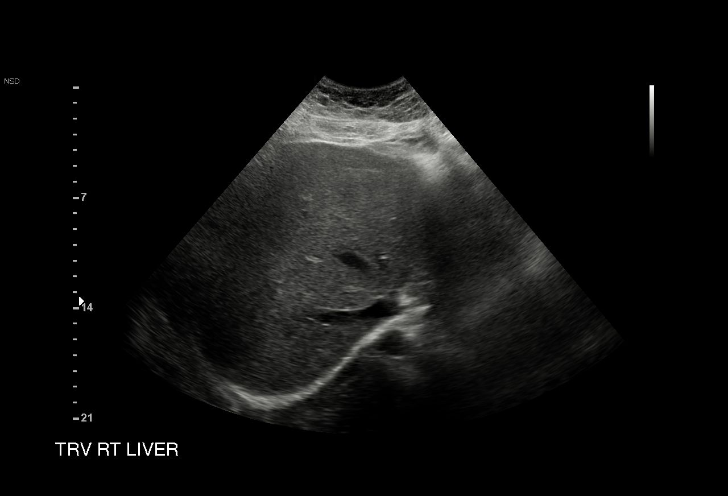
[im 20/52]
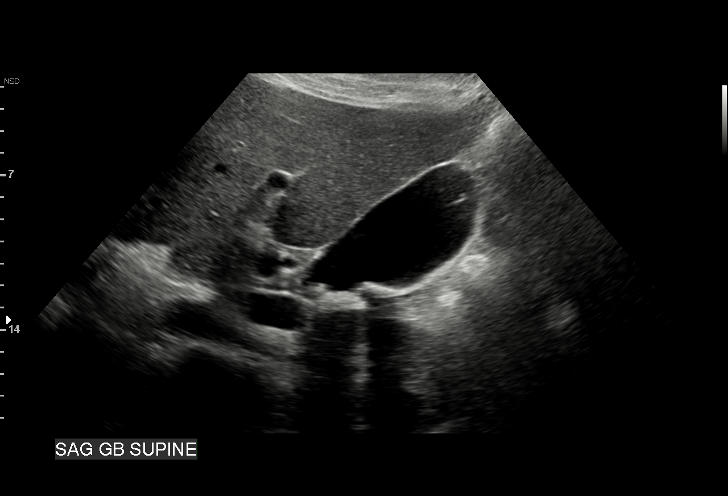
[im 22/52]
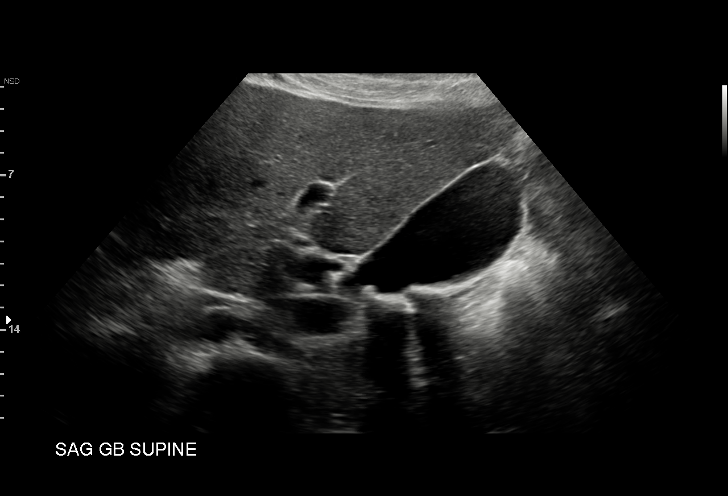
[im 26/52]
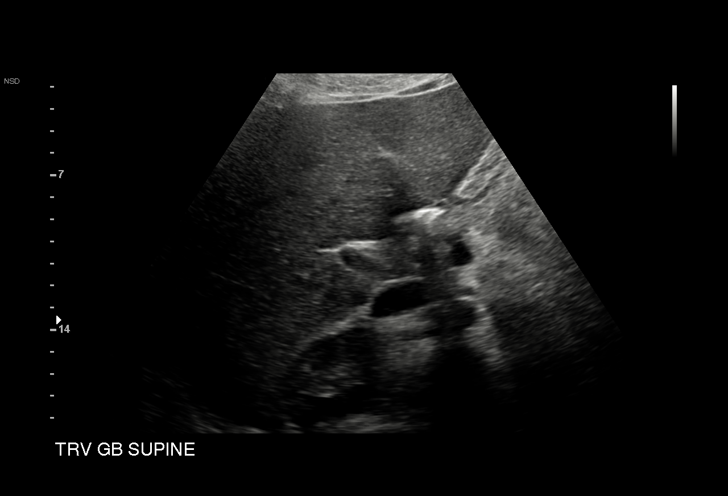
[im 30/52]
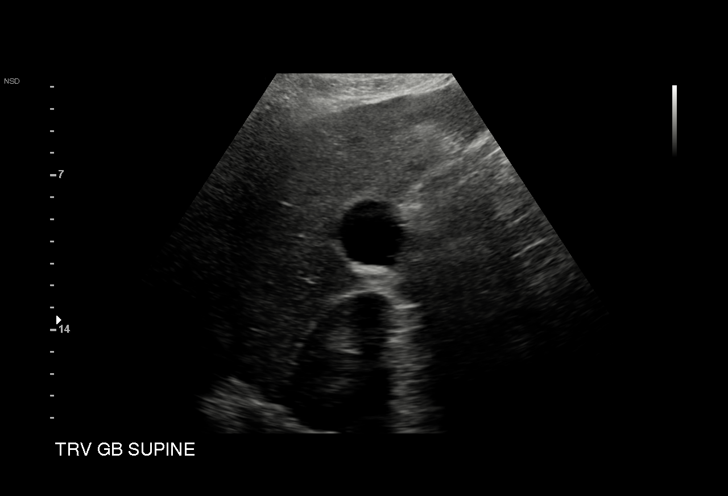
[im 32/52]
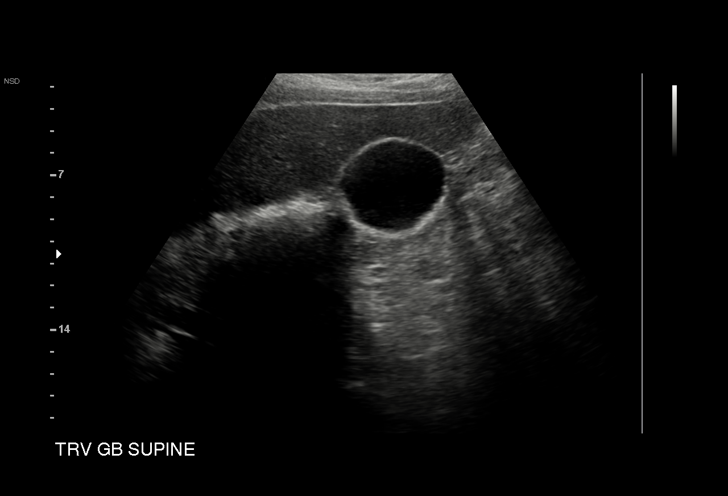
[im 37/52]
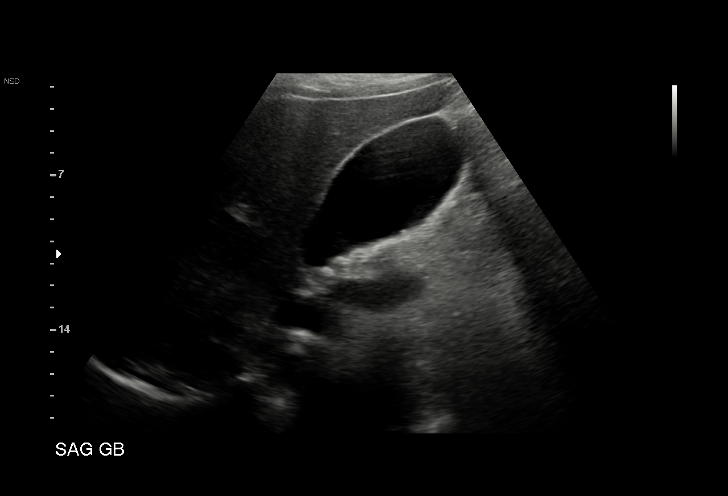
[im 41/52]
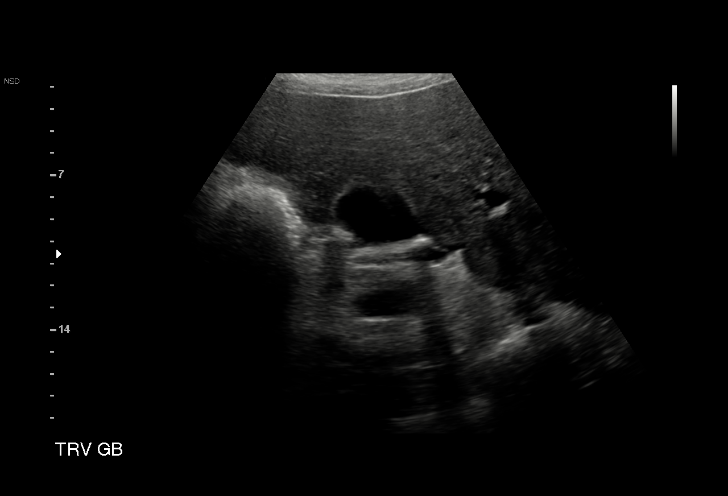
[im 43/52]
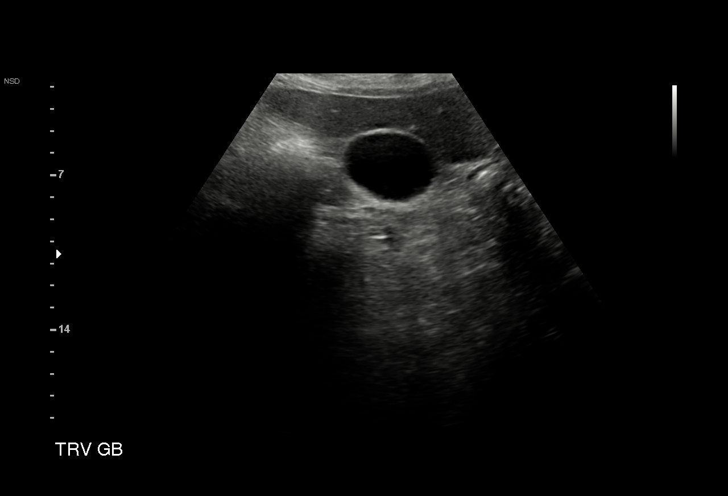
[im 47/52]
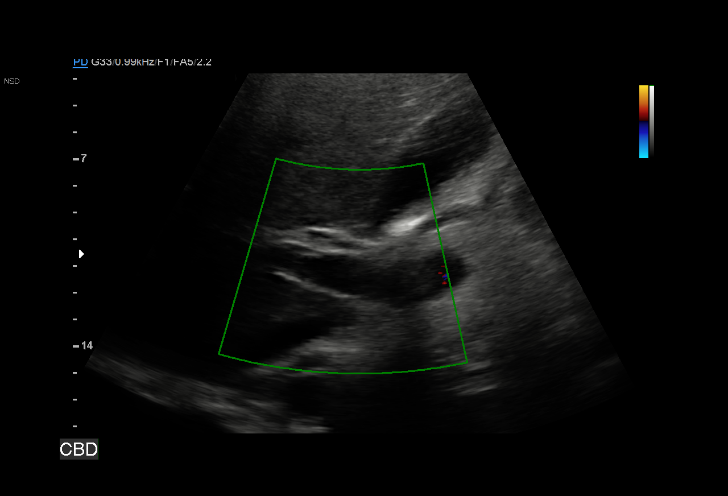
[im 52/52]
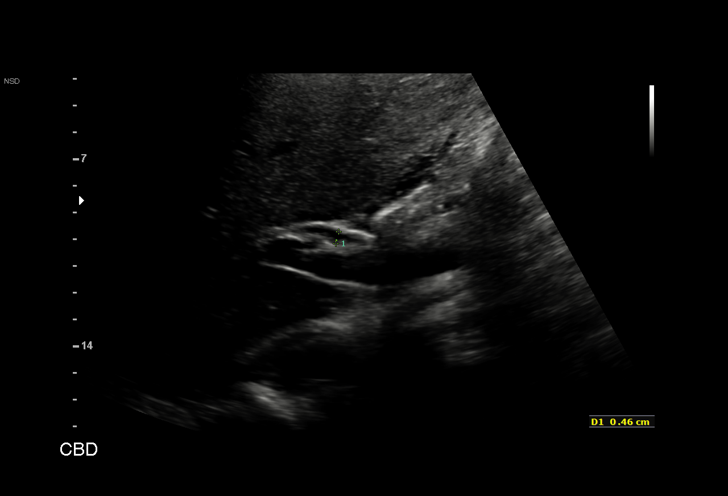

[15 of 25 positions shown; findings below may reference images not displayed]

FINDINGS: Gallbladder:

Small stones are demonstrated in the dependent gallbladder measuring
up to 10 mm maximal dimension. No sludge or wall thickening.
Murphy's sign is negative.

Common bile duct:

Diameter: 4.6 mm, normal

Liver:

No focal lesion identified. Within normal limits in parenchymal
echogenicity. Portal vein is patent on color Doppler imaging with
normal direction of blood flow towards the liver.
IMPRESSION: Cholelithiasis. No additional findings to suggest acute
cholecystitis.

## 2018-09-16 DIAGNOSIS — Z01419 Encounter for gynecological examination (general) (routine) without abnormal findings: Secondary | ICD-10-CM | POA: Diagnosis not present

## 2018-09-16 DIAGNOSIS — A6004 Herpesviral vulvovaginitis: Secondary | ICD-10-CM | POA: Diagnosis not present

## 2018-09-16 DIAGNOSIS — Z3041 Encounter for surveillance of contraceptive pills: Secondary | ICD-10-CM | POA: Diagnosis not present

## 2018-09-16 DIAGNOSIS — L689 Hypertrichosis, unspecified: Secondary | ICD-10-CM | POA: Diagnosis not present

## 2018-09-16 DIAGNOSIS — L83 Acanthosis nigricans: Secondary | ICD-10-CM | POA: Diagnosis not present

## 2018-09-24 IMAGING — US US MFM UA CORD DOPPLER
1 series · 15 of 28 positions shown · non-contrast
Comparison: none

[Series 1: us mfm ua cord doppler · 35 acquisitions, 15 frames shown]
[im 1/35]
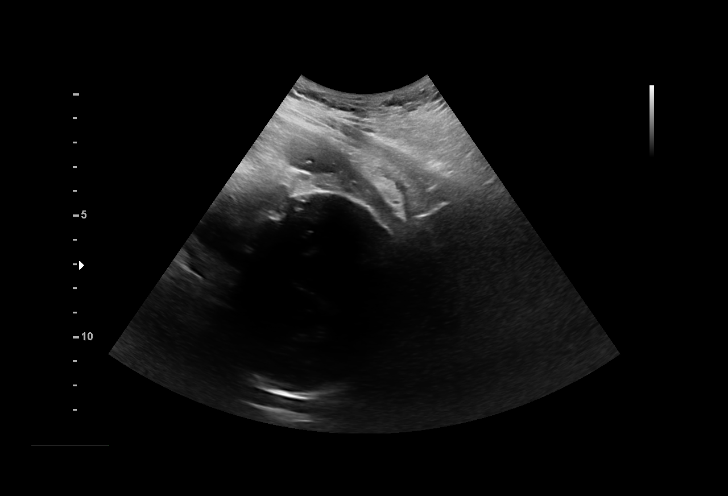
[im 3/35]
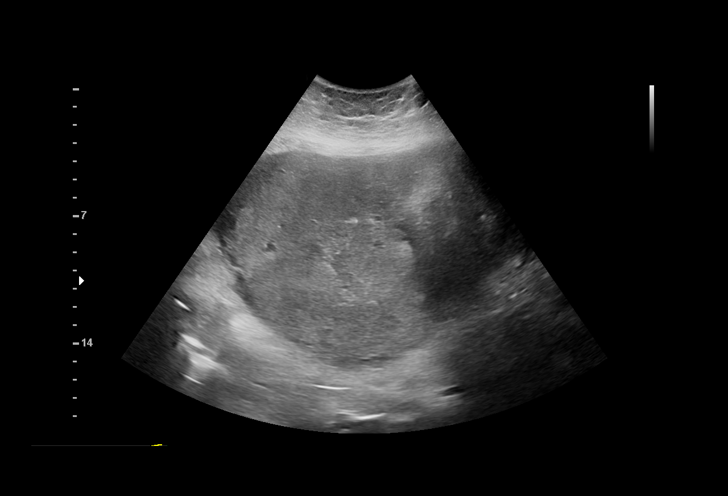
[im 6/35]
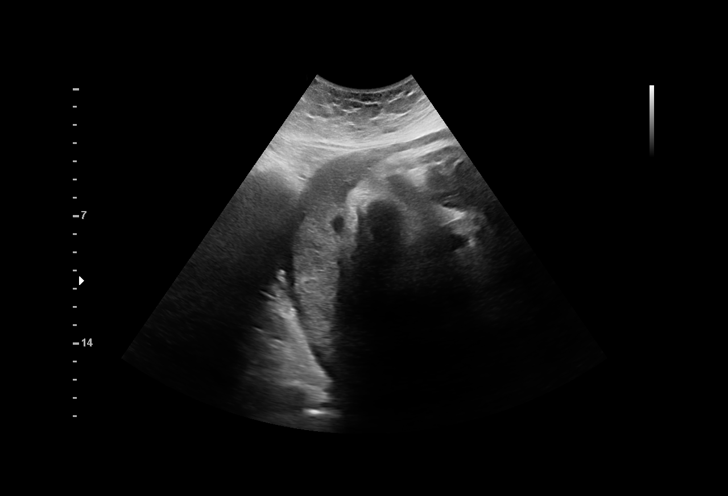
[im 8/35]
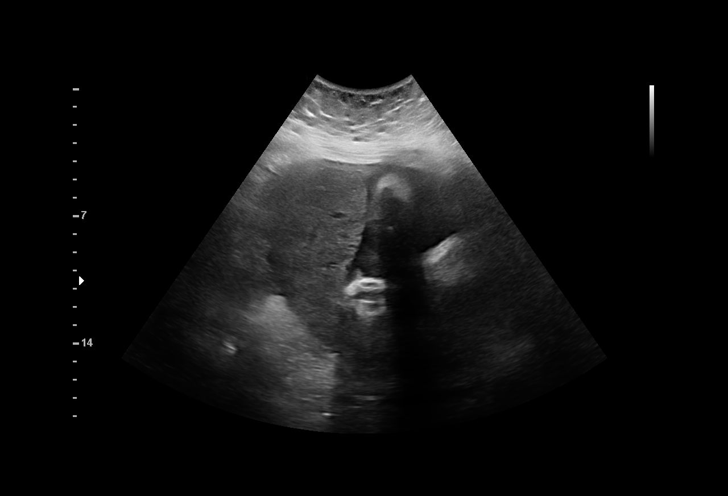
[im 11/35]
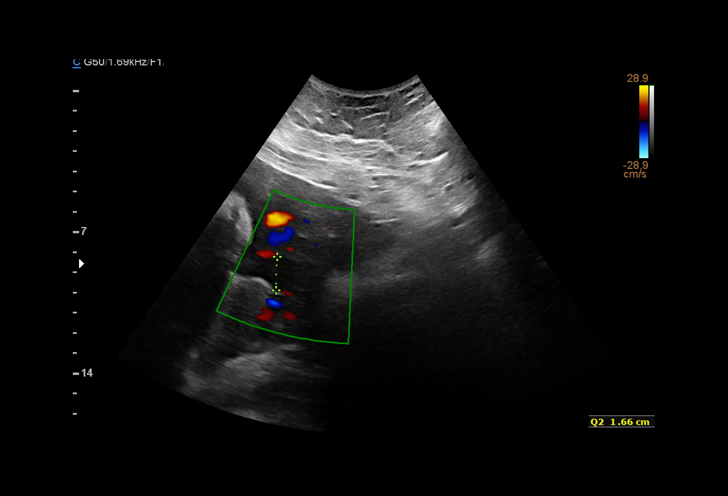
[im 13/35]
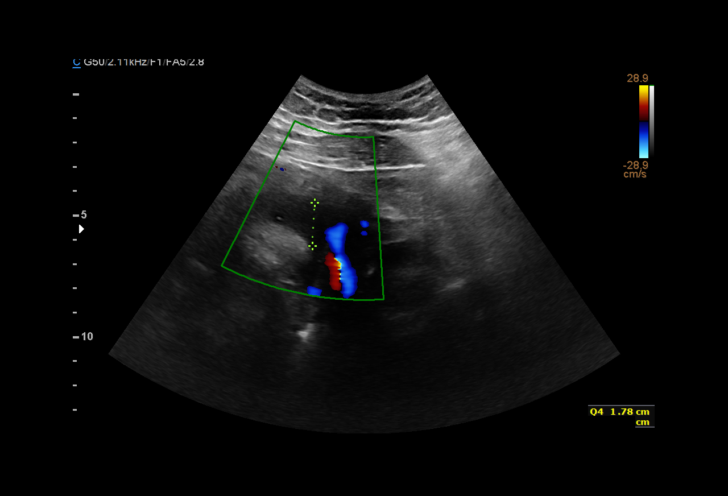
[im 16/35]
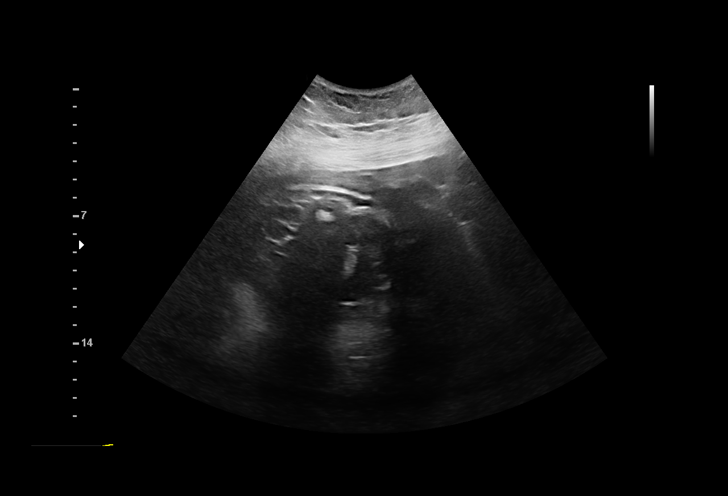
[im 18/35]
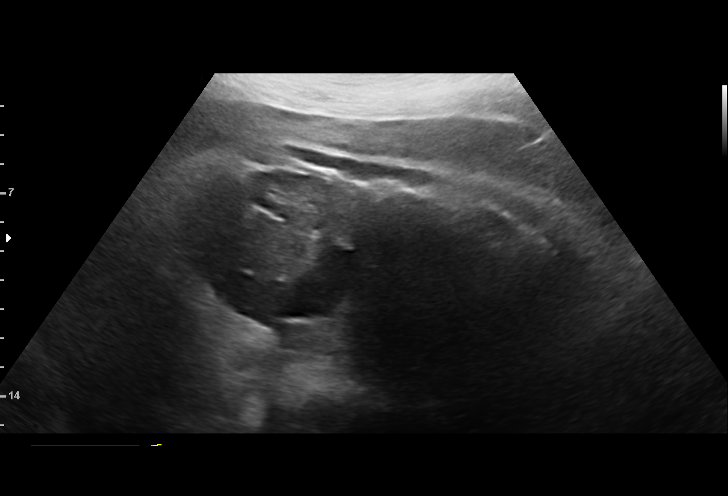
[im 19/35]
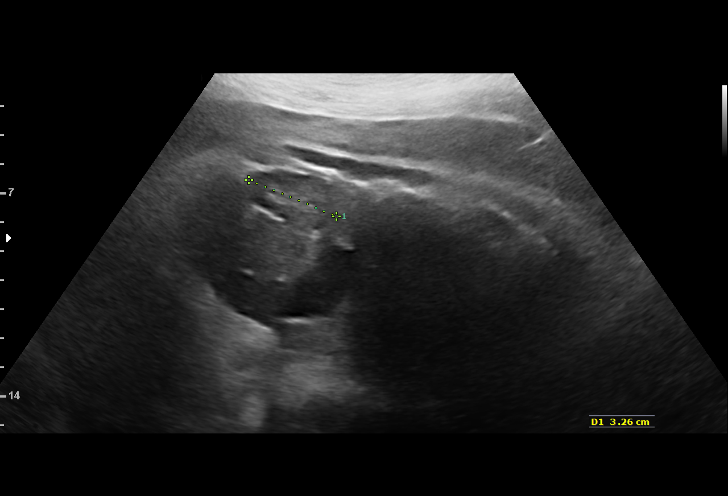
[im 22/35]
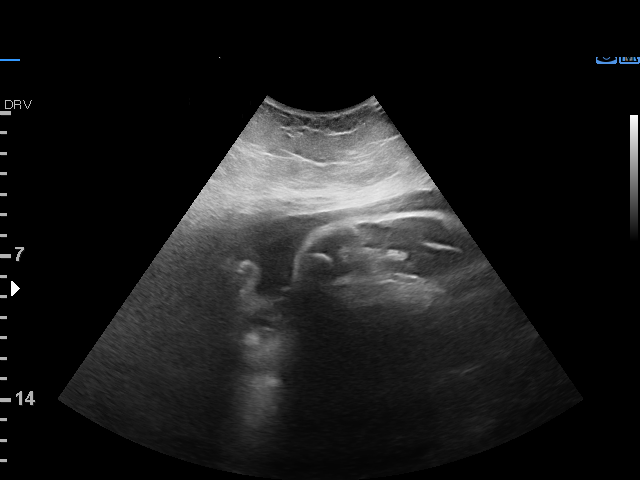
[im 24/35]
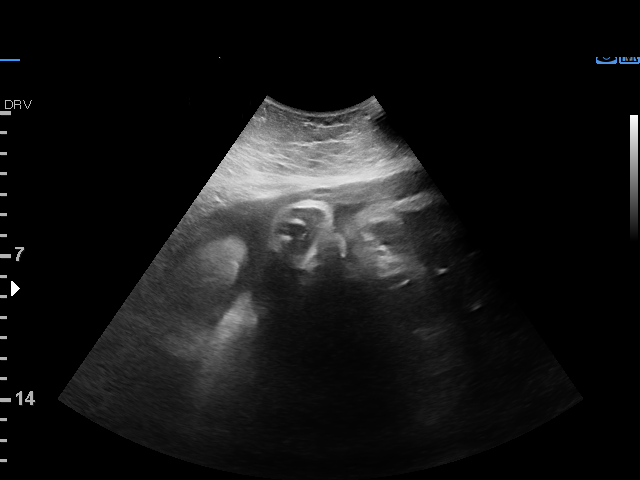
[im 27/35]
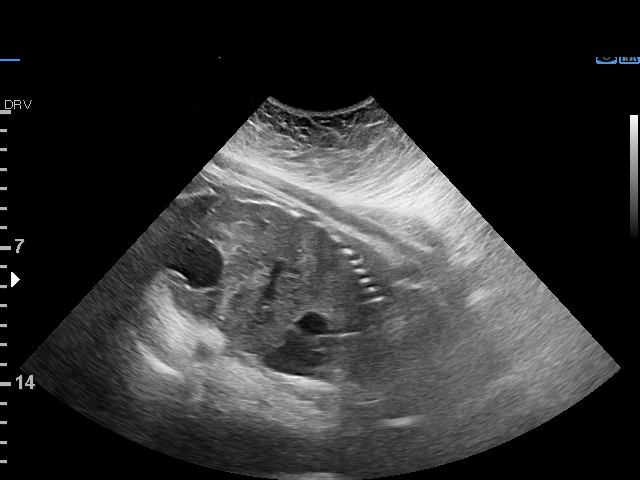
[im 29/35]
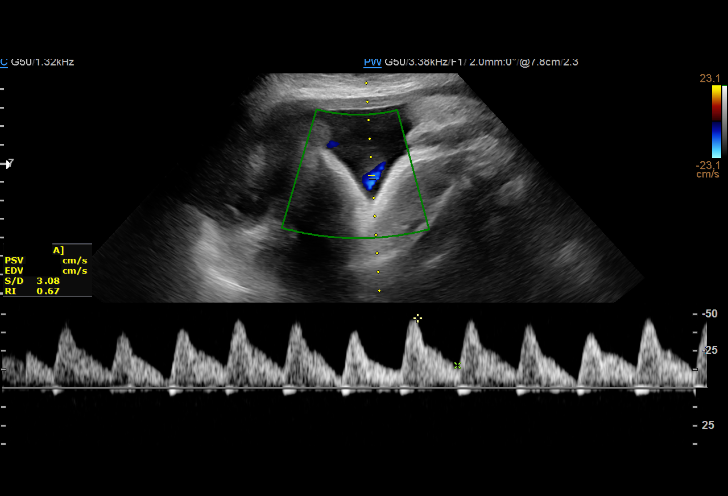
[im 32/35]
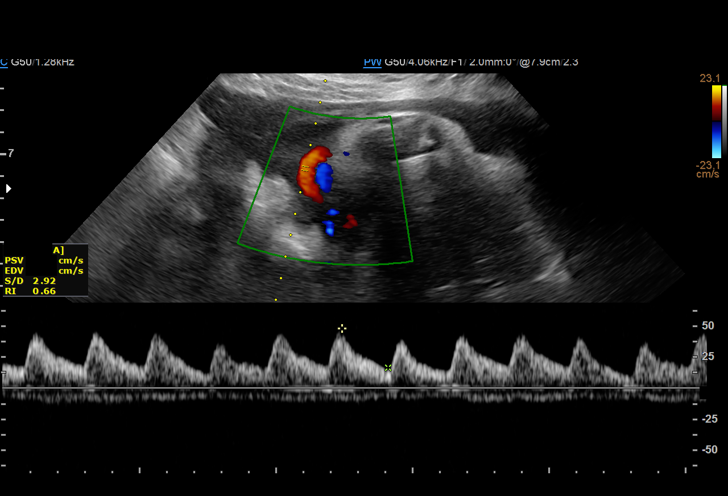
[im 35/35]
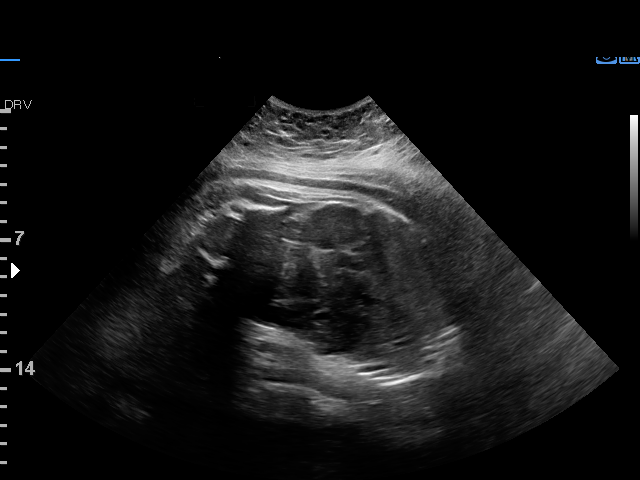

[15 of 28 positions shown; findings below may reference images not displayed]

OB/GYN
MAU/Triage
KARLIOS CNM

1  LLUVIA MCKINSTRY            [id number]      [id number]     [id number]
2  LLUVIA MCKINSTRY            [id number]      [id number]     [id number]
Indications

37 weeks gestation of pregnancy
Small for gestational age fetus affecting
management of mother; AC at 8th %tile
Chronic Hepatitis C complicating pregnancy,
antepartum
Suboxone use
Obesity complicating pregnancy, third
trimester
Poor obstetric history: Previous IUFD
(stillbirth) (@ 05wks)
OB History

Blood Type:            Height:         Weight (lb):  216       BMI:
Gravidity:    3         Term:   0        Prem:   0        SAB:   2
TOP:          0       Ectopic:  0        Living: 0
Fetal Evaluation

Num Of Fetuses:     1
Fetal Heart         133
Rate(bpm):
Cardiac Activity:   Observed
Presentation:       Cephalic
Placenta:           Posterior, above cervical os
P. Cord Insertion:  Previously Visualized
Amniotic Fluid
AFI FV:      Subjectively within normal limits

AFI Sum(cm)     %Tile       Largest Pocket(cm)
7.74            8

RUQ(cm)       RLQ(cm)       LUQ(cm)        LLQ(cm)
4.3           1.78          1.66           0
Biophysical Evaluation

Amniotic F.V:   Pocket => 2 cm two         F. Tone:        Observed
planes
F. Movement:    Observed                   Score:          [DATE]
F. Breathing:   Observed
Gestational Age

LMP:           37w 6d        Date:  09/06/16                 EDD:   06/13/17
Best:          37w 6d     Det. By:  LMP  (09/06/16)          EDD:   06/13/17
Anatomy

Stomach:               Appears normal, left   Bladder:                Appears normal
sided
Kidneys:               Appear normal
Doppler - Fetal Vessels

Umbilical Artery
S/D     %tile                                     PSV    ADFV    RDFV
(cm/s)
3.[REDACTED]      No      No

Cervix Uterus Adnexa

Cervix
Not visualized (advanced GA >96wks)
Impression

Intrauterine pregnancy at 37+6 weeks with history of IUFD,
low fetal growth
Normal amniotic fluid
BPP [DATE]
UA dopplers normal with no absent or reversed end diastolic
flow
Recommendations

Induction scheduled for 06/06/2017. Will see for BPP and
dopplers next week

## 2018-10-13 DIAGNOSIS — Z20828 Contact with and (suspected) exposure to other viral communicable diseases: Secondary | ICD-10-CM | POA: Diagnosis not present

## 2018-10-13 DIAGNOSIS — B349 Viral infection, unspecified: Secondary | ICD-10-CM | POA: Diagnosis not present

## 2018-10-20 DIAGNOSIS — U071 COVID-19: Secondary | ICD-10-CM | POA: Diagnosis not present

## 2018-11-12 ENCOUNTER — Ambulatory Visit: Payer: Managed Care, Other (non HMO) | Admitting: Internal Medicine

## 2018-12-17 DIAGNOSIS — Z3041 Encounter for surveillance of contraceptive pills: Secondary | ICD-10-CM | POA: Diagnosis not present

## 2019-01-23 DIAGNOSIS — H16222 Keratoconjunctivitis sicca, not specified as Sjogren's, left eye: Secondary | ICD-10-CM | POA: Diagnosis not present

## 2019-04-21 DIAGNOSIS — Z111 Encounter for screening for respiratory tuberculosis: Secondary | ICD-10-CM | POA: Diagnosis not present

## 2019-06-13 ENCOUNTER — Ambulatory Visit: Payer: Medicaid Other | Attending: Internal Medicine

## 2019-06-13 DIAGNOSIS — Z23 Encounter for immunization: Secondary | ICD-10-CM | POA: Insufficient documentation

## 2019-06-13 NOTE — Progress Notes (Signed)
   Covid-19 Vaccination Clinic  Name:  Abigail Fox    MRN: [id number] DOB: July 25, 1985  06/13/2019  Abigail Fox was observed post Covid-19 immunization for 15 minutes without incident. She was provided with Vaccine Information Sheet and instruction to access the V-Safe system.   Abigail Fox was instructed to call 911 with any severe reactions post vaccine: Marland Kitchen Difficulty breathing  . Swelling of face and throat  . A fast heartbeat  . A bad rash all over body  . Dizziness and weakness   Immunizations Administered    Name Date Dose VIS Date Route   Pfizer COVID-19 Vaccine 06/13/2019  9:50 AM 0.3 mL 03/19/2019 Intramuscular   Manufacturer: [employer], [employer]   Lot: [id number]   NDC: [address]-1

## 2019-07-13 ENCOUNTER — Ambulatory Visit: Payer: Medicaid Other | Attending: Internal Medicine

## 2019-07-13 DIAGNOSIS — Z23 Encounter for immunization: Secondary | ICD-10-CM

## 2019-07-13 NOTE — Progress Notes (Signed)
   Covid-19 Vaccination Clinic  Name:  Abigail Fox    MRN: [id number] DOB: [birth year]  07/13/2019  Ms. Prindle was observed post Covid-19 immunization for 15 minutes without incident. She was provided with Vaccine Information Sheet and instruction to access the V-Safe system.   Ms. Vlachos was instructed to call 911 with any severe reactions post vaccine: Marland Kitchen Difficulty breathing  . Swelling of face and throat  . A fast heartbeat  . A bad rash all over body  . Dizziness and weakness   Immunizations Administered    Name Date Dose VIS Date Route   Pfizer COVID-19 Vaccine 07/13/2019  9:41 AM 0.3 mL 03/19/2019 Intramuscular   Manufacturer: [employer], [employer]   Lot: [id number]   NDC: [address]-3

## 2020-02-22 ENCOUNTER — Other Ambulatory Visit: Payer: Self-pay

## 2020-02-22 ENCOUNTER — Inpatient Hospital Stay (HOSPITAL_COMMUNITY)
Admission: EM | Admit: 2020-02-22 | Discharge: 2020-02-22 | Disposition: A | Discharge status: Home / Self Care | Payer: Managed Care, Other (non HMO) | Attending: Obstetrics and Gynecology | Admitting: Obstetrics and Gynecology

## 2020-02-22 ENCOUNTER — Inpatient Hospital Stay (HOSPITAL_COMMUNITY): Payer: Managed Care, Other (non HMO)

## 2020-02-22 ENCOUNTER — Encounter (HOSPITAL_COMMUNITY): Payer: Self-pay

## 2020-02-22 DIAGNOSIS — O99611 Diseases of the digestive system complicating pregnancy, first trimester: Secondary | ICD-10-CM | POA: Diagnosis not present

## 2020-02-22 DIAGNOSIS — R1032 Left lower quadrant pain: Secondary | ICD-10-CM | POA: Diagnosis present

## 2020-02-22 DIAGNOSIS — O219 Vomiting of pregnancy, unspecified: Secondary | ICD-10-CM | POA: Diagnosis not present

## 2020-02-22 DIAGNOSIS — Z3A01 Less than 8 weeks gestation of pregnancy: Secondary | ICD-10-CM | POA: Diagnosis not present

## 2020-02-22 DIAGNOSIS — R102 Pelvic and perineal pain: Secondary | ICD-10-CM | POA: Insufficient documentation

## 2020-02-22 DIAGNOSIS — R748 Abnormal levels of other serum enzymes: Secondary | ICD-10-CM | POA: Insufficient documentation

## 2020-02-22 DIAGNOSIS — O26891 Other specified pregnancy related conditions, first trimester: Secondary | ICD-10-CM | POA: Diagnosis not present

## 2020-02-22 DIAGNOSIS — K529 Noninfective gastroenteritis and colitis, unspecified: Secondary | ICD-10-CM | POA: Diagnosis not present

## 2020-02-22 DIAGNOSIS — O99891 Other specified diseases and conditions complicating pregnancy: Secondary | ICD-10-CM

## 2020-02-22 DIAGNOSIS — O26899 Other specified pregnancy related conditions, unspecified trimester: Secondary | ICD-10-CM

## 2020-02-22 DIAGNOSIS — Z3490 Encounter for supervision of normal pregnancy, unspecified, unspecified trimester: Secondary | ICD-10-CM

## 2020-02-22 DIAGNOSIS — R7401 Elevation of levels of liver transaminase levels: Secondary | ICD-10-CM | POA: Diagnosis not present

## 2020-02-22 LAB — CBC
HCT: 42 % (ref 36.0–46.0)
Hemoglobin: 14.2 g/dL (ref 12.0–15.0)
MCH: 30.7 pg (ref 26.0–34.0)
MCHC: 33.8 g/dL (ref 30.0–36.0)
MCV: 90.7 fL (ref 80.0–100.0)
Platelets: 153 10*3/uL (ref 150–400)
RBC: 4.63 MIL/uL (ref 3.87–5.11)
RDW: 12.7 % (ref 11.5–15.5)
WBC: 12.1 10*3/uL — ABNORMAL HIGH (ref 4.0–10.5)
nRBC: 0 % (ref 0.0–0.2)

## 2020-02-22 LAB — WET PREP, GENITAL
Clue Cells Wet Prep HPF POC: NONE SEEN
Sperm: NONE SEEN
Trich, Wet Prep: NONE SEEN
Yeast Wet Prep HPF POC: NONE SEEN

## 2020-02-22 LAB — COMPREHENSIVE METABOLIC PANEL
ALT: 90 U/L — ABNORMAL HIGH (ref 0–44)
AST: 59 U/L — ABNORMAL HIGH (ref 15–41)
Albumin: 3.9 g/dL (ref 3.5–5.0)
Alkaline Phosphatase: 58 U/L (ref 38–126)
Anion gap: 11 (ref 5–15)
BUN: 8 mg/dL (ref 6–20)
CO2: 21 mmol/L — ABNORMAL LOW (ref 22–32)
Calcium: 9.6 mg/dL (ref 8.9–10.3)
Chloride: 103 mmol/L (ref 98–111)
Creatinine, Ser: 0.84 mg/dL (ref 0.44–1.00)
GFR, Estimated: >60 mL/min (ref >60)
Glucose, Bld: 125 mg/dL — ABNORMAL HIGH (ref 70–99)
Potassium: 3.9 mmol/L (ref 3.5–5.1)
Sodium: 135 mmol/L (ref 135–145)
Total Bilirubin: 0.7 mg/dL (ref 0.3–1.2)
Total Protein: 7.5 g/dL (ref 6.5–8.1)

## 2020-02-22 LAB — URINALYSIS, ROUTINE W REFLEX MICROSCOPIC
Bilirubin Urine: NEGATIVE
Glucose, UA: NEGATIVE mg/dL
Hgb urine dipstick: NEGATIVE
Ketones, ur: 5 mg/dL — AB
Leukocytes,Ua: NEGATIVE
Nitrite: NEGATIVE
Protein, ur: NEGATIVE mg/dL
Specific Gravity, Urine: 1.018 (ref 1.005–1.030)
pH: 5 (ref 5.0–8.0)

## 2020-02-22 LAB — ABO/RH: ABO/RH(D): O POS

## 2020-02-22 LAB — HCG, QUANTITATIVE, PREGNANCY: hCG, Beta Chain, Quant, S: 25741 m[IU]/mL — ABNORMAL HIGH (ref <5)

## 2020-02-22 LAB — I-STAT BETA HCG BLOOD, ED (MC, WL, AP ONLY): I-stat hCG, quantitative: >2000 m[IU]/mL — ABNORMAL HIGH (ref <5)

## 2020-02-22 LAB — LIPASE, BLOOD: Lipase: 30 U/L (ref 11–51)

## 2020-02-22 MED ORDER — ONDANSETRON 4 MG PO TBDP
4.0000 mg | ORAL_TABLET | Freq: Once | ORAL | Status: AC | PRN
  Administered 2020-02-22: 4 mg via ORAL
  Filled 2020-02-22: qty 1

## 2020-02-22 MED ORDER — DICYCLOMINE HCL 10 MG PO CAPS: 10.0000 mg | ORAL_CAPSULE | Freq: Four times a day (QID) | ORAL | 0 refills | Status: DC | PRN

## 2020-02-22 MED ORDER — DICYCLOMINE HCL 10 MG PO CAPS: 10.0000 mg | ORAL_CAPSULE | Freq: Once | ORAL | Status: DC

## 2020-02-22 MED ORDER — ACETAMINOPHEN 325 MG PO TABS
650.0000 mg | ORAL_TABLET | Freq: Once | ORAL | Status: AC
  Administered 2020-02-22: 650 mg via ORAL
  Filled 2020-02-22: qty 2

## 2020-02-22 NOTE — ED Triage Notes (Signed)
Pt reports 1 week of generalized abd pain, emesis and diarrhea. Tried multiple OTC medications without relief.

## 2020-02-22 NOTE — Progress Notes (Signed)
GC/Chlamydia & wet prep cultures obtained by this RN.

## 2020-02-22 NOTE — MAU Note (Signed)
Presents with c/o abdominal pain that began 5 days ago.  Reports pain is a throbbing sensation.  Denies VB.  LMP unsure, approximately 3 months ago.

## 2020-02-22 NOTE — Discharge Instructions (Signed)
Safe Medications in Pregnancy    Acne: Benzoyl Peroxide Salicylic Acid  Backache/Headache: Tylenol: 2 regular strength every 4 hours OR              2 Extra strength every 6 hours  Colds/Coughs/Allergies: Benadryl (alcohol free) 25 mg every 6 hours as needed Breath right strips Claritin Cepacol throat lozenges Chloraseptic throat spray Cold-Eeze- up to three times per day Cough drops, alcohol free Flonase (by prescription only) Guaifenesin Mucinex Robitussin DM (plain only, alcohol free) Saline nasal spray/drops Sudafed (pseudoephedrine) & Actifed ** use only after [redacted] weeks gestation and if you do not have high blood pressure Tylenol Vicks Vaporub Zinc lozenges Zyrtec   Constipation: Colace Ducolax suppositories Fleet enema Glycerin suppositories Metamucil Milk of magnesia Miralax Senokot Smooth move tea  Diarrhea: Kaopectate Imodium A-D  *NO pepto Bismol  Hemorrhoids: Anusol Anusol HC Preparation H Tucks  Indigestion: Tums Maalox Mylanta Zantac  Pepcid  Insomnia: Benadryl (alcohol free) 25mg  every 6 hours as needed Tylenol PM Unisom, no Gelcaps  Leg Cramps: Tums MagGel  Nausea/Vomiting:  Bonine Dramamine Emetrol Ginger extract Sea bands Meclizine  Nausea medication to take during pregnancy:  Unisom (doxylamine succinate 25 mg tablets) Take one tablet daily at bedtime. If symptoms are not adequately controlled, the dose can be increased to a maximum recommended dose of two tablets daily (1/2 tablet in the morning, 1/2 tablet mid-afternoon and one at bedtime). Vitamin B6 100mg  tablets. Take one tablet twice a day (up to 200 mg per day).  Skin Rashes: Aveeno products Benadryl cream or 25mg  every 6 hours as needed Calamine Lotion 1% cortisone cream  Yeast infection: Gyne-lotrimin 7 Monistat 7   **If taking multiple medications, please check labels to avoid duplicating the same active ingredients **take  medication as directed on the label ** Do not exceed 4000 mg of tylenol in 24 hours **Do not take medications that contain aspirin or ibuprofen            Abdominal Pain During Pregnancy  Abdominal pain is common during pregnancy, and has many possible causes. Some causes are more serious than others, and sometimes the cause is not known. Abdominal pain can be a sign that labor is starting. It can also be caused by normal growth and stretching of muscles and ligaments during pregnancy. Always tell your health care provider if you have any abdominal pain. Follow these instructions at home:  Do not have sex or put anything in your vagina until your pain goes away completely.  Get plenty of rest until your pain improves.  Drink enough fluid to keep your urine pale yellow.  Take over-the-counter and prescription medicines only as told by your health care provider.  Keep all follow-up visits as told by your health care provider. This is important. Contact a health care provider if:  Your pain continues or gets worse after resting.  You have lower abdominal pain that: ? Comes and goes at regular intervals. ? Spreads to your back. ? Is similar to menstrual cramps.  You have pain or burning when you urinate. Get help right away if:  You have a fever or chills.  You have vaginal bleeding.  You are leaking fluid from your vagina.  You are passing tissue from your vagina.  You have vomiting or diarrhea that lasts for more than 24 hours.  Your baby is moving less than usual.  You feel very weak or faint.  You have shortness of breath.  You develop severe pain in  your upper abdomen. Summary  Abdominal pain is common during pregnancy, and has many possible causes.  If you experience abdominal pain during pregnancy, tell your health care provider right away.  Follow your health care provider's home care instructions and keep all follow-up visits as directed. This  information is not intended to replace advice given to you by your health care provider. Make sure you discuss any questions you have with your health care provider. Document Revised: 07/13/2018 Document Reviewed: 06/27/2016 Elsevier Patient Education  2020 [employer].       Prenatal Care Providers           [facility] - accepts patients without insurance  Phone: [phone]  [facility] [employer] @ [facility]   Phone: [phone]  [facility]       Phone: [phone]            [facility] @ [place]     Phone: [phone]          [facility] [phone] @ [employer]   Phone: [phone]  [facility] @ [facility] - accepts patients without insurance  Phone: [phone]  [facility] @ [facility] Phone: [phone]     [facility] - accepts patients without insurance Phone: [phone]  [facility]  Phone: [phone]  [phone] OB/GYN Phone: [phone]  [facility] Phone: [phone]  [facility] Phone: [phone]  [facility] Phone: [phone]  [facility]  Phone: [phone]           Food Choices to Help Relieve Diarrhea, Adult When you have diarrhea, the foods you eat and your eating habits are very important. Choosing the right foods and drinks can help:  Relieve diarrhea.  Replace lost fluids and nutrients.  Prevent dehydration. What general guidelines should I follow?  Relieving diarrhea  Choose foods with less than 2 g or .07 oz. of fiber per serving.  Limit fats to less than 8 tsp (38 g or 1.34 oz.) a day.  Avoid the following: ? Foods and beverages sweetened with high-fructose corn syrup, honey, or sugar alcohols such as xylitol, sorbitol, and mannitol. ? Foods that contain a lot of fat or  sugar. ? Fried, greasy, or spicy foods. ? High-fiber grains, breads, and cereals. ? Raw fruits and vegetables.  Eat foods that are rich in probiotics. These foods include dairy products such as yogurt and fermented milk products. They help increase healthy bacteria in the stomach and intestines (gastrointestinal tract, or GI tract).  If you have lactose intolerance, avoid dairy products. These may make your diarrhea worse.  Take medicine to help stop diarrhea (antidiarrheal medicine) only as told by your health care provider. Replacing nutrients  Eat small meals or snacks every 3-4 hours.  Eat bland foods, such as white rice, toast, or baked potato, until your diarrhea starts to get better. Gradually reintroduce nutrient-rich foods as tolerated or as told by your health care provider. This includes: ? Well-cooked protein foods. ? Peeled, seeded, and soft-cooked fruits and vegetables. ? Low-fat dairy products.  Take vitamin and mineral supplements as told by your health care provider. Preventing dehydration  Start by sipping water or a special solution to prevent dehydration (oral rehydration solution, ORS). Urine that is clear or pale yellow means that you are getting enough fluid.  Try to drink at least 8-10 cups of fluid each  day to help replace lost fluids.  You may add other liquids in addition to water, such as clear juice or decaffeinated sports drinks, as tolerated or as told by your health care provider.  Avoid drinks with caffeine, such as coffee, tea, or soft drinks.  Avoid alcohol. What foods are recommended?     The items listed may not be a complete list. Talk with your health care provider about what dietary choices are best for you. Grains White rice. White, [place], or pita breads (fresh or toasted), including plain rolls, buns, or bagels. White pasta. Saltine, soda, or graham crackers. Pretzels. Low-fiber cereal. Cooked cereals made with water (such as cornmeal,  farina, or cream cereals). Plain muffins. Matzo. Melba toast. Zwieback. Vegetables Potatoes (without the skin). Most well-cooked and canned vegetables without skins or seeds. Tender lettuce. Fruits Apple sauce. Fruits canned in juice. Cooked apricots, cherries, grapefruit, peaches, pears, or plums. Fresh bananas and cantaloupe. Meats and other protein foods Baked or boiled chicken. Eggs. Tofu. Fish. Seafood. Smooth nut butters. Ground or well-cooked tender beef, ham, veal, lamb, pork, or poultry. Dairy Plain yogurt, kefir, and unsweetened liquid yogurt. Lactose-free milk, buttermilk, skim milk, or soy milk. Low-fat or nonfat hard cheese. Beverages Water. Low-calorie sports drinks. Fruit juices without pulp. Strained tomato and vegetable juices. Decaffeinated teas. Sugar-free beverages not sweetened with sugar alcohols. Oral rehydration solutions, if approved by your health care provider. Seasoning and other foods Bouillon, broth, or soups made from recommended foods. What foods are not recommended? The items listed may not be a complete list. Talk with your health care provider about what dietary choices are best for you. Grains Whole grain, whole wheat, bran, or rye breads, rolls, pastas, and crackers. Wild or brown rice. Whole grain or bran cereals. Barley. Oats and oatmeal. Corn tortillas or taco shells. Granola. Popcorn. Vegetables Raw vegetables. Fried vegetables. Cabbage, broccoli, Brussels sprouts, artichokes, baked beans, beet greens, corn, kale, legumes, peas, sweet potatoes, and yams. Potato skins. Cooked spinach and cabbage. Fruits Dried fruit, including raisins and dates. Raw fruits. Stewed or dried prunes. Canned fruits with syrup. Meat and other protein foods Fried or fatty meats. Deli meats. Chunky nut butters. Nuts and seeds. Beans and lentils. Tomasa Blase. Hot dogs. Sausage. Dairy High-fat cheeses. Whole milk, chocolate milk, and beverages made with milk, such as milk shakes.  Half-and-half. Cream. sour cream. Ice cream. Beverages Caffeinated beverages (such as coffee, tea, soda, or energy drinks). Alcoholic beverages. Fruit juices with pulp. Prune juice. Soft drinks sweetened with high-fructose corn syrup or sugar alcohols. High-calorie sports drinks. Fats and oils Butter. Cream sauces. Margarine. Salad oils. Plain salad dressings. Olives. Avocados. Mayonnaise. Sweets and desserts Sweet rolls, doughnuts, and sweet breads. Sugar-free desserts sweetened with sugar alcohols such as xylitol and sorbitol. Seasoning and other foods Honey. Hot sauce. Chili powder. Gravy. Cream-based or milk-based soups. Pancakes and waffles. Summary  When you have diarrhea, the foods you eat and your eating habits are very important.  Make sure you get at least 8-10 cups of fluid each day, or enough to keep your urine clear or pale yellow.  Eat bland foods and gradually reintroduce healthy, nutrient-rich foods as tolerated, or as told by your health care provider.  Avoid high-fiber, fried, greasy, or spicy foods. This information is not intended to replace advice given to you by your health care provider. Make sure you discuss any questions you have with your health care provider. Document Revised: 07/16/2018 Document Reviewed: 03/22/2016 Elsevier Patient Education  2020 Elsevier  Inc.        Morning Sickness  Morning sickness is when a woman feels nauseous during pregnancy. This nauseous feeling may or may not come with vomiting. It often occurs in the morning, but it can be a problem at any time of day. Morning sickness is most common during the first trimester. In some cases, it may continue throughout pregnancy. Although morning sickness is unpleasant, it is usually harmless unless the woman develops severe and continual vomiting (hyperemesis gravidarum), a condition that requires more intense treatment. What are the causes? The exact cause of this condition is not known, but  it seems to be related to normal hormonal changes that occur in pregnancy. What increases the risk? You are more likely to develop this condition if:  You experienced nausea or vomiting before your pregnancy.  You had morning sickness during a previous pregnancy.  You are pregnant with more than one baby, such as twins. What are the signs or symptoms? Symptoms of this condition include:  Nausea.  Vomiting. How is this diagnosed? This condition is usually diagnosed based on your signs and symptoms. How is this treated? In many cases, treatment is not needed for this condition. Making some changes to what you eat may help to control symptoms. Your health care provider may also prescribe or recommend:  Vitamin B6 supplements.  Anti-nausea medicines.  Ginger. Follow these instructions at home: Medicines  Take over-the-counter and prescription medicines only as told by your health care provider. Do not use any prescription, over-the-counter, or herbal medicines for morning sickness without first talking with your health care provider.  Taking multivitamins before getting pregnant can prevent or decrease the severity of morning sickness in most women. Eating and drinking  Eat a piece of dry toast or crackers before getting out of bed in the morning.  Eat 5 or 6 small meals a day.  Eat dry and bland foods, such as rice or a baked potato. Foods that are high in carbohydrates are often helpful.  Avoid greasy, fatty, and spicy foods.  Have someone cook for you if the smell of any food causes nausea and vomiting.  If you feel nauseous after taking prenatal vitamins, take the vitamins at night or with a snack.  Snack on protein foods between meals if you are hungry. Nuts, yogurt, and cheese are good options.  Drink fluids throughout the day.  Try ginger ale made with real ginger, ginger tea made from fresh grated ginger, or ginger candies. General instructions  Do not use any  products that contain nicotine or tobacco, such as cigarettes and e-cigarettes. If you need help quitting, ask your health care provider.  Get an air purifier to keep the air in your house free of odors.  Get plenty of fresh air.  Try to avoid odors that trigger your nausea.  Consider trying these methods to help relieve symptoms: ? Wearing an acupressure wristband. These wristbands are often worn for seasickness. ? Acupuncture. Contact a health care provider if:  Your home remedies are not working and you need medicine.  You feel dizzy or light-headed.  You are losing weight. Get help right away if:  You have persistent and uncontrolled nausea and vomiting.  You faint.  You have severe pain in your abdomen. Summary  Morning sickness is when a woman feels nauseous during pregnancy. This nauseous feeling may or may not come with vomiting.  Morning sickness is most common during the first trimester.  It often occurs in  the morning, but it can be a problem at any time of day.  In many cases, treatment is not needed for this condition. Making some changes to what you eat may help to control symptoms. This information is not intended to replace advice given to you by your health care provider. Make sure you discuss any questions you have with your health care provider. Document Revised: 03/07/2017 Document Reviewed: 04/27/2016 Elsevier Patient Education  2020 [employer]

## 2020-02-22 NOTE — ED Triage Notes (Signed)
Emergency Medicine Provider OB Triage Evaluation Note  Abigail Fox is a 34 y.o. female, [id number], at Unknown gestation who presents to the emergency department with complaints of abdominal pain ongoing for 5 days. Located to the LUQ/LLQ and left flank. Associated with NV and urinary frequency. Was initially having diarrhea but this is now resolved.  Review of  Systems  Positive: abd pain, urinary frequency/urgency, NV Negative: diarrhea, fevers, vaginal bleeding  Physical Exam  BP 140/89 (BP Location: Right Arm)   Pulse 65   Temp 98.3 F (36.8 C) (Oral)   Resp 20   Ht 5\' 3"  (1.6 m)   Wt 108.9 kg   SpO2 100%   BMI 42.51 kg/m  General: Awake, no distress  HEENT: Atraumatic  Resp: Normal effort  Cardiac: Normal rate Abd: Nondistended, nontender  MSK: Moves all extremities without difficulty Neuro: Speech clear  Medical Decision Making  Pt evaluated for pregnancy concern and is stable for transfer to MAU. Pt is in agreement with plan for transfer.  11:25 AM Discussed with MAU APP, , who accepts patient in transfer.  Clinical Impression  No diagnosis found.     Joni Reining, PA-C 02/22/20 1125

## 2020-02-22 NOTE — MAU Provider Note (Signed)
History     CSN: [id number]  Arrival date and time: 02/22/20 2952   First Provider Initiated Contact with Patient 02/22/20 1233      Chief Complaint  Patient presents with  . Abdominal Pain  . Emesis   [name] is a 34 y.o. [id number] at Unknown who presents to MAU for LLQ/LBP. Patient reports others in her house had nausea and vomiting, which she then got, but reports that others in her house recovered, but she did not. Patient also reports that at home medications did not work to treat her symptoms, like they did for others in her household. Patient last had something to drink at [facility] [employer] [facility]. Patient is actively moving in bed and cannot sit still due to her reported pain. Patient reports she has been able to drink fluids.  Patient originally reported to the ED, but found out for the first time that she was pregnant while she was there. Patient reports she is currently taking Syeda continuously, but recently switched from a norethindrone-only pill. Patient reports she made the switch about 3-4 months ago. Patient reports last remembered LMP was 3 months ago.  Pt reports her symptoms started 5 days ago and have progressively gotten worse since that time. Patient reports nausea and vomiting with onset of LLQ pain and reports that she has not been able to eat for several days. Patient reports she received Zofran in the ED and has vomited x6 since Zofran administration. Patient reports her biggest concern is her pain, which she is rating as 8/10. Patient reports she was given Tylenol in the ED, which did not help.  Pt denies VB, vaginal discharge/odor/itching. Pt denies constipation, diarrhea, or urinary problems. Pt denies fever, chills, fatigue, sweating or changes in appetite. Pt denies SOB or chest pain. Pt denies dizziness, HA, light-headedness, weakness.  Problems this pregnancy include: pt has not yet been seen. Allergies? NKDA Current medications/supplements? Zofran, Tylenol,  Valtrex, buprenorphine  BID, Syeda, ducolax Prenatal care provider? [facility], last seen 6 months   OB History    Gravida  4   Para  1   Term  1   Preterm  0   AB  2   Living  1     SAB  1   TAB      Ectopic      Multiple  0   Live Births  1           Past Medical History:  Diagnosis Date  . Anxiety   . Drug addiction (HCC)    subutex  . Hepatitis    hepatitis C  . Kidney stones   . MRSA infection 2010,2013  . Ovarian cyst     Past Surgical History:  Procedure Laterality Date  . CESAREAN SECTION N/A 06/08/2017   Procedure: CESAREAN SECTION;  Surgeon: Geryl Rankins, MD;  Location: [facility] BIRTHING SUITES;  Service: Obstetrics;  Laterality: N/A;  . CHOLECYSTECTOMY N/A 01/15/2018   Procedure: LAPAROSCOPIC CHOLECYSTECTOMY WITH INTRAOPERATIVE CHOLANGIOGRAM ERAS PATHWAY;  Surgeon: Gaynelle Adu, MD;  Location: [facility] ORS;  Service: General;  Laterality: N/A;  . DILATION AND EVACUATION N/A 05/14/2016   Procedure: DILATATION AND EVACUATION (D&E) 2ND TRIMESTER WITH ULTRA SOUND GUIDANCE;  Surgeon: Tereso Newcomer, MD;  Location: [facility] ORS;  Service: Gynecology;  Laterality: N/A;  . miscarriage    . TOOTH EXTRACTION      Family History  Problem Relation Age of Onset  . Diabetes Mother   . Hypertension Mother   .  Heart disease Maternal Grandfather   . Kidney cancer Maternal Grandmother   . Lung cancer Paternal Grandmother     Social History   Tobacco Use  . Smoking status: Never Smoker  . Smokeless tobacco: Never Used  Vaping Use  . Vaping Use: Never used  Substance Use Topics  . Alcohol use: No  . Drug use: Yes    Types: Marijuana    Comment: February 14, 2021    Allergies: No Known Allergies  No medications prior to admission.    Review of Systems  Constitutional: Negative for chills, diaphoresis, fatigue and fever.  Eyes: Negative for visual disturbance.  Respiratory: Negative for shortness of breath.   Cardiovascular: Negative for chest pain.    Gastrointestinal: Positive for nausea and vomiting. Negative for abdominal pain, constipation and diarrhea.  Genitourinary: Positive for pelvic pain. Negative for dysuria, flank pain, frequency, urgency, vaginal bleeding and vaginal discharge.  Musculoskeletal: Positive for back pain.  Neurological: Negative for dizziness, weakness, light-headedness and headaches.   Physical Exam   Blood pressure 115/67, pulse 60, temperature 98.1 F (36.7 C), temperature source Oral, resp. rate 18, height  (1.6 m), weight 114 kg, SpO2 98 %.  Patient Vitals for the past 24 hrs:  BP Temp Temp src Pulse Resp SpO2 Height Weight  02/22/20 1558 115/67 -- -- 60 -- -- -- --  02/22/20 1245 120/76 98.1 F (36.7 C) Oral 77 18 98 % -- --  02/22/20 1213 131/90 97.9 F (36.6 C) Oral 63 20 97 % -- --  02/22/20 1158 -- -- -- -- -- --  (1.6 m) 114 kg  02/22/20 0935 -- -- -- -- -- --  (1.6 m) 108.9 kg  02/22/20 0931 140/89 98.3 F (36.8 C) Oral 65 20 100 % -- --   Physical Exam Vitals and nursing note reviewed.  Constitutional:      General: She is not in acute distress.    Appearance: Normal appearance. She is not ill-appearing, toxic-appearing or diaphoretic.  HENT:     Head: Normocephalic and atraumatic.  Abdominal:     General: There is no distension.     Palpations: Abdomen is soft. There is no mass.     Tenderness: There is abdominal tenderness in the left lower quadrant. There is no right CVA tenderness, left CVA tenderness, guarding or rebound.     Hernia: No hernia is present.  Musculoskeletal:       Arms:  Skin:    General: Skin is warm and dry.  Neurological:     Mental Status: She is alert and oriented to person, place, and time.  Psychiatric:        Mood and Affect: Mood normal.        Behavior: Behavior normal.        Thought Content: Thought content normal.        Judgment: Judgment normal.    Results for orders placed or performed during the hospital encounter of  02/22/20 (from the past 24 hour(s))  Lipase, blood     Status: None   Collection Time: 02/22/20  9:45 AM  Result Value Ref Range   Lipase 30 11 - 51 U/L  Comprehensive metabolic panel     Status: Abnormal   Collection Time: 02/22/20  9:45 AM  Result Value Ref Range   Sodium 135 135 - 145 mmol/L   Potassium 3.9 3.5 - 5.1 mmol/L   Chloride 103 98 - 111 mmol/L   CO2 21 (L)  22 - 32 mmol/L   Glucose, Bld 125 (H) 70 - 99 mg/dL   BUN 8 6 - 20 mg/dL   Creatinine, Ser 2.59 0.44 - 1.00 mg/dL   Calcium 9.6 8.9 - 56.3 mg/dL   Total Protein 7.5 6.5 - 8.1 g/dL   Albumin 3.9 3.5 - 5.0 g/dL   AST 59 (H) 15 - 41 U/L   ALT 90 (H) 0 - 44 U/L   Alkaline Phosphatase 58 38 - 126 U/L   Total Bilirubin 0.7 0.3 - 1.2 mg/dL   GFR, Estimated >87 >56 mL/min   Anion gap 11 5 - 15  CBC     Status: Abnormal   Collection Time: 02/22/20  9:45 AM  Result Value Ref Range   WBC 12.1 (H) 4.0 - 10.5 K/uL   RBC 4.63 3.87 - 5.11 MIL/uL   Hemoglobin 14.2 12.0 - 15.0 g/dL   HCT 43.3 36 - 46 %   MCV 90.7 80.0 - 100.0 fL   MCH 30.7 26.0 - 34.0 pg   MCHC 33.8 30.0 - 36.0 g/dL   RDW 29.5 18.8 - 41.6 %   Platelets 153 150 - 400 K/uL   nRBC 0.0 0.0 - 0.2 %  I-Stat beta hCG blood, ED     Status: Abnormal   Collection Time: 02/22/20 10:00 AM  Result Value Ref Range   I-stat hCG, quantitative >2,000.0 (H) <5 mIU/mL   Comment 3          Urinalysis, Routine w reflex microscopic Urine, Clean Catch     Status: Abnormal   Collection Time: 02/22/20 11:58 AM  Result Value Ref Range   Color, Urine YELLOW YELLOW   APPearance HAZY (A) CLEAR   Specific Gravity, Urine 1.018 1.005 - 1.030   pH 5.0 5.0 - 8.0   Glucose, UA NEGATIVE NEGATIVE mg/dL   Hgb urine dipstick NEGATIVE NEGATIVE   Bilirubin Urine NEGATIVE NEGATIVE   Ketones, ur 5 (A) NEGATIVE mg/dL   Protein, ur NEGATIVE NEGATIVE mg/dL   Nitrite NEGATIVE NEGATIVE   Leukocytes,Ua NEGATIVE NEGATIVE  hCG, quantitative, pregnancy     Status: Abnormal   Collection Time:  02/22/20  1:05 PM  Result Value Ref Range   hCG, Beta Chain, Quant, S 25,741 (H) <5 mIU/mL  ABO/Rh     Status: None   Collection Time: 02/22/20  1:05 PM  Result Value Ref Range   ABO/RH(D) O POS    No rh immune globuloin      NOT A RH IMMUNE GLOBULIN CANDIDATE, PT RH POSITIVE Performed at [facility], [address], [place], [place] [address]   Wet prep, genital     Status: Abnormal   Collection Time: 02/22/20  2:26 PM   Specimen: PATH Cytology Cervicovaginal Ancillary Only  Result Value Ref Range   Yeast Wet Prep HPF POC NONE SEEN NONE SEEN   Trich, Wet Prep NONE SEEN NONE SEEN   Clue Cells Wet Prep HPF POC NONE SEEN NONE SEEN   WBC, Wet Prep HPF POC FEW (A) NONE SEEN   Sperm NONE SEEN    US OB LESS THAN 14 WEEKS WITH OB TRANSVAGINAL  Result Date: 02/22/2020 CLINICAL DATA:  Pelvic pain for 5 days. EXAM: OBSTETRIC <14 WK [place] AND TRANSVAGINAL OB US TECHNIQUE: Both transabdominal and transvaginal ultrasound examinations were performed for complete evaluation of the gestation as well as the maternal uterus, adnexal regions, and pelvic cul-de-sac. Transvaginal technique was performed to assess early pregnancy. COMPARISON:  None. FINDINGS:  Intrauterine gestational sac: Present Yolk sac:  Present Embryo:  Present Cardiac Activity: Present Heart Rate: 66 bpm.  Difficult to demonstrate. CRL:  4.3 mm   6 w   1 d                  US EDC: 10/16/2020 Subchorionic hemorrhage:  None visualized. Maternal uterus/adnexae: Both ovaries are normal. No free pelvic fluid. IMPRESSION: 1. Single intrauterine gestational sac and small embryo estimated at 6 weeks and 1 days gestation. 2. Cardiac activity is noted but the heart rate is only 66 beats per minute. Recommend follow-up ultrasound examination in 10 days to 2 weeks. 3. No subchorionic hemorrhage. 4. Normal ovaries. Electronically Signed   By: Rudie MeyerP.  Gallerani M.D.   On: 02/22/2020 13:32    MAU Course  Procedures  MDM -r/o ectopic, + N/V, LLQ pain,  LBP -UA: hazy/5ketones -CBC: WBCs 12.1, otherwise WNL -CMP: AST/ALT 59/90 -US: +yolk sac, +embryo, FHR 66, 6716w1d, normal ovaries, no free fluid -hCG: 16,10925,741 -ABO: O Positive -WetPrep: WNL -GC/CT collected -consulted with Dr. Vergie LivingPickens re: pain management, recommends not to give patient anything prior to US to prevent masking of ectopic pain -reassessment after US shows pain unchanged and abdominal exam with focal tenderness in LLQ on deep palpation without associated mass, no tenderness in other quadrants -vomiting x1 while in MAU -consulted with Dr. Adrian BlackwaterStinson d/t his status as a suboxone prescriber, who believes that this is GI in origin and patient can be prescribed bentyl and given advice to take immodium PRN after diarrhea, no pain medication is necessary at this time -upon discharge, pt reports she does not feel like anything was done for her today and that she feels this visit was a "waste of time." Patient reports she "cannot feel like this" and "all I have been given is Tylenol." Explained to patient rationale for discharge and that she can return to ED/Urgent Care with abdominal pain that is unrelieved by Bentyl or with worsening symptoms. Also explained to patient that our goal in MAU was to rule out any life-threatening pregnancy-related emergencies, which was done today by finding an IUP on US. Patient states she is happy that the pregnancy is OK, but needs to feel normal so she can return to work and take care of her other children. At this time, patient able to sit up on her own in bed and talk to provider and sit still. Offered patient transfer to ED, pt unsure, but requests to have dose of Bentyl in MAU, but simultaneously states that she knows it will not work for her because she has had Bentyl in the past. Notified patient that if Bentyl does not work she still has the option to be seen in the ED. Patient and partner seem unhappy with this option due to the possible elongated wait times in  the ED. Patient advised that she would need to wait at least 20 minutes after Bentyl administration prior to leaving MAU. Patient agreeable. Patient also requesting RX for Diclegis. When asked about the reason for this request, pt states it was because her OB told her she would need Diclegis each pregnancy to prevent hyperemesis. Discussed use of Diclegis in pregnancy and that it is not for hyperemesis prevention. Patient reports she feels like she can eat and drink at this time and requests something to drink. -approximately 15min after order placed for Bentyl, pt called out stating that this was taking too long and she wants to be discharged home. -pt  discharged to home in stable condition  Orders Placed This Encounter  Procedures  . Wet prep, genital    Standing Status:   Standing    Number of Occurrences:   1  . Culture, OB Urine    Standing Status:   Standing    Number of Occurrences:   1  . US OB LESS THAN 14 WEEKS WITH OB TRANSVAGINAL    Standing Status:   Standing    Number of Occurrences:   1    Order Specific Question:   Symptom/Reason for Exam    Answer:   Pelvic pain in pregnancy [527782]  . US OB LESS THAN 14 WEEKS WITH OB TRANSVAGINAL    Standing Status:   Future    Standing Expiration Date:   02/21/2021    Scheduling Instructions:     Please schedule patient for follow-up US, Monday - Thursday between 8:00 am - 10:00 am and 12:00 pm - 3:00 pm. The patient will follow-up with [facility] immediately after [place] for results.    Order Specific Question:   Reason for Exam (SYMPTOM  OR DIAGNOSIS REQUIRED)    Answer:   viability    Order Specific Question:   Preferred Imaging Location?    Answer:   [facility]  . Lipase, blood    Standing Status:   Standing    Number of Occurrences:   1  . Comprehensive metabolic panel    Standing Status:   Standing    Number of Occurrences:   1  . CBC    Standing Status:   Standing    Number of Occurrences:   1  . Urinalysis, Routine w  reflex microscopic    Standing Status:   Standing    Number of Occurrences:   1  . hCG, quantitative, pregnancy    Standing Status:   Standing    Number of Occurrences:   1  . Diet NPO time specified    Standing Status:   Standing    Number of Occurrences:   1  . I-Stat beta hCG blood, ED    Standing Status:   Standing    Number of Occurrences:   1  . ABO/Rh    Standing Status:   Standing    Number of Occurrences:   1  . Discharge patient    Order Specific Question:   Discharge disposition    Answer:   01-Home or Self Care [1]    Order Specific Question:   Discharge patient date    Answer:   02/22/2020   Meds ordered this encounter  Medications  . ondansetron (ZOFRAN-ODT) disintegrating tablet 4 mg  . acetaminophen (TYLENOL) tablet 650 mg  . dicyclomine (BENTYL) 10 MG capsule    Sig: Take 1 capsule (10 mg total) by mouth 4 (four) times daily as needed for up to 7 doses for spasms.    Dispense:  7 capsule    Refill:  0    Order Specific Question:   Supervising Provider    Answer:   [place] Bing [id number]  . DISCONTD: dicyclomine (BENTYL) capsule 10 mg    Assessment and Plan   1. Intrauterine pregnancy   2. Pelvic pain in pregnancy   3. [redacted] weeks gestation of pregnancy   4. Gastroenteritis   5. Elevated liver enzymes     Allergies as of 02/22/2020   No Known Allergies     Medication List    STOP taking these medications   [name] 0.35 MG  tablet Generic drug: norethindrone   ibuprofen 800 MG tablet Commonly known as: ADVIL   ondansetron 4 MG tablet Commonly known as: ZOFRAN     TAKE these medications   buprenorphine 8 MG Subl SL tablet Commonly known as: SUBUTEX Place 8 mg under the tongue 2 (two) times daily.   dicyclomine 10 MG capsule Commonly known as: Bentyl Take 1 capsule (10 mg total) by mouth 4 (four) times daily as needed for up to 7 doses for spasms.   multivitamin with minerals Tabs tablet Take 1 tablet by mouth daily.   oxyCODONE 5 MG  immediate release tablet Commonly known as: Oxy IR/ROXICODONE Take 1 tablet (5 mg total) by mouth every 8 (eight) hours as needed for severe pain.   valACYclovir 500 MG tablet Commonly known as: VALTREX Take 500 mg by mouth daily.       -will call with culture results, if positive -f/u US scheduled in two weeks for viability -RX bentyl -safe meds in pregnancy list given -list of OB providers given, pt to start [facility] -pt to contact subutex provider to discuss possible increase in dosage -pt to Urgent Care/ED with worsening symptoms -return MAU precautions -pt discharged to home in stable condition  Joni Reining E Keyosha Tiedt 02/22/2020, 4:06 PM

## 2020-02-23 LAB — GC/CHLAMYDIA PROBE AMP (~~LOC~~) NOT AT ARMC
Chlamydia: NEGATIVE
Comment: NEGATIVE
Comment: NORMAL
Neisseria Gonorrhea: NEGATIVE

## 2020-02-24 LAB — CULTURE, OB URINE: Culture: 3000 — AB

## 2020-02-25 ENCOUNTER — Encounter: Payer: Self-pay | Admitting: Advanced Practice Midwife

## 2020-02-25 DIAGNOSIS — O2343 Unspecified infection of urinary tract in pregnancy, third trimester: Secondary | ICD-10-CM | POA: Insufficient documentation

## 2020-02-25 DIAGNOSIS — B951 Streptococcus, group B, as the cause of diseases classified elsewhere: Secondary | ICD-10-CM | POA: Insufficient documentation

## 2020-03-08 ENCOUNTER — Other Ambulatory Visit: Payer: Self-pay

## 2020-03-08 ENCOUNTER — Ambulatory Visit
Admission: RE | Admit: 2020-03-08 | Discharge: 2020-03-08 | Disposition: A | Discharge status: Home / Self Care | Payer: Managed Care, Other (non HMO) | Source: Ambulatory Visit | Attending: Women's Health | Admitting: Women's Health

## 2020-03-08 DIAGNOSIS — O3680X0 Pregnancy with inconclusive fetal viability, not applicable or unspecified: Secondary | ICD-10-CM | POA: Insufficient documentation

## 2020-03-08 DIAGNOSIS — Z3A01 Less than 8 weeks gestation of pregnancy: Secondary | ICD-10-CM | POA: Diagnosis not present

## 2020-03-09 ENCOUNTER — Inpatient Hospital Stay: Admission: RE | Admit: 2020-03-09 | Payer: Managed Care, Other (non HMO) | Source: Ambulatory Visit

## 2020-04-03 ENCOUNTER — Inpatient Hospital Stay (HOSPITAL_COMMUNITY)
Admission: EM | Admit: 2020-04-03 | Discharge: 2020-04-04 | Disposition: A | Discharge status: Home / Self Care | Payer: Managed Care, Other (non HMO) | Attending: Emergency Medicine | Admitting: Emergency Medicine

## 2020-04-03 ENCOUNTER — Encounter (HOSPITAL_COMMUNITY): Payer: Self-pay | Admitting: Obstetrics and Gynecology

## 2020-04-03 DIAGNOSIS — Z3A11 11 weeks gestation of pregnancy: Secondary | ICD-10-CM | POA: Insufficient documentation

## 2020-04-03 DIAGNOSIS — R1031 Right lower quadrant pain: Secondary | ICD-10-CM

## 2020-04-03 DIAGNOSIS — R52 Pain, unspecified: Secondary | ICD-10-CM

## 2020-04-03 DIAGNOSIS — O99321 Drug use complicating pregnancy, first trimester: Secondary | ICD-10-CM

## 2020-04-03 DIAGNOSIS — O99891 Other specified diseases and conditions complicating pregnancy: Secondary | ICD-10-CM | POA: Diagnosis not present

## 2020-04-03 DIAGNOSIS — O99820 Streptococcus B carrier state complicating pregnancy: Secondary | ICD-10-CM | POA: Diagnosis not present

## 2020-04-03 DIAGNOSIS — F12188 Cannabis abuse with other cannabis-induced disorder: Secondary | ICD-10-CM | POA: Diagnosis not present

## 2020-04-03 DIAGNOSIS — O210 Mild hyperemesis gravidarum: Secondary | ICD-10-CM | POA: Diagnosis not present

## 2020-04-03 DIAGNOSIS — O2343 Unspecified infection of urinary tract in pregnancy, third trimester: Secondary | ICD-10-CM

## 2020-04-03 DIAGNOSIS — R112 Nausea with vomiting, unspecified: Secondary | ICD-10-CM

## 2020-04-03 DIAGNOSIS — B951 Streptococcus, group B, as the cause of diseases classified elsewhere: Secondary | ICD-10-CM | POA: Diagnosis not present

## 2020-04-03 DIAGNOSIS — O26891 Other specified pregnancy related conditions, first trimester: Secondary | ICD-10-CM | POA: Diagnosis not present

## 2020-04-03 LAB — CBC WITH DIFFERENTIAL/PLATELET
Abs Immature Granulocytes: 0.12 10*3/uL — ABNORMAL HIGH (ref 0.00–0.07)
Basophils Absolute: 0 10*3/uL (ref 0.0–0.1)
Basophils Relative: 0 %
Eosinophils Absolute: 0 10*3/uL (ref 0.0–0.5)
Eosinophils Relative: 0 %
HCT: 39.8 % (ref 36.0–46.0)
Hemoglobin: 13.7 g/dL (ref 12.0–15.0)
Immature Granulocytes: 1 %
Lymphocytes Relative: 7 %
Lymphs Abs: 1.1 10*3/uL (ref 0.7–4.0)
MCH: 30.6 pg (ref 26.0–34.0)
MCHC: 34.4 g/dL (ref 30.0–36.0)
MCV: 89 fL (ref 80.0–100.0)
Monocytes Absolute: 0.3 10*3/uL (ref 0.1–1.0)
Monocytes Relative: 2 %
Neutro Abs: 14.9 10*3/uL — ABNORMAL HIGH (ref 1.7–7.7)
Neutrophils Relative %: 90 %
Platelets: 174 10*3/uL (ref 150–400)
RBC: 4.47 MIL/uL (ref 3.87–5.11)
RDW: 12.5 % (ref 11.5–15.5)
WBC: 16.4 10*3/uL — ABNORMAL HIGH (ref 4.0–10.5)
nRBC: 0 % (ref 0.0–0.2)

## 2020-04-03 LAB — URINALYSIS, ROUTINE W REFLEX MICROSCOPIC
Bilirubin Urine: NEGATIVE
Glucose, UA: NEGATIVE mg/dL
Hgb urine dipstick: NEGATIVE
Ketones, ur: 20 mg/dL — AB
Leukocytes,Ua: NEGATIVE
Nitrite: NEGATIVE
Protein, ur: NEGATIVE mg/dL
Specific Gravity, Urine: 1.016 (ref 1.005–1.030)
pH: 5 (ref 5.0–8.0)

## 2020-04-03 MED ORDER — SODIUM CHLORIDE 0.9 % IV SOLN
25.0000 mg | Freq: Once | INTRAVENOUS | Status: AC
  Administered 2020-04-03: 23:00:00 25 mg via INTRAVENOUS
  Filled 2020-04-03: qty 1

## 2020-04-03 MED ORDER — HYDROMORPHONE HCL 1 MG/ML IJ SOLN
1.0000 mg | Freq: Once | INTRAMUSCULAR | Status: AC
  Administered 2020-04-03: 23:00:00 1 mg via INTRAVENOUS
  Filled 2020-04-03: qty 1

## 2020-04-03 NOTE — MAU Provider Note (Signed)
Chief Complaint:  Abdominal Pain and Nausea   Event Date/Time   First Provider Initiated Contact with Patient 04/03/20 2207     HPI: Abigail Fox is a 34 y.o. G4P1021 at [redacted]w[redacted]d who was transferred to [facility]U from the [facility] for r/o acute abdomen for severe RLQ (10/10) pain, nausea and vomiting x3 days. She has a confirmed IUP from 02/22/20 when she presented with similar symptoms. She did not have her pain treated during that visit as it was determined to be GI in nature. She has a history of Subutex treatment, and uses marijuana but says she has not smoked since she found out she was pregnant. Has Bentyl for spasms but has not taken it. She denies any vaginal bleeding or discharge, fever, falls or recent illness.   Past Medical History:  Diagnosis Date  . Anxiety   . Drug addiction (HCC)    subutex  . Hepatitis    hepatitis C  . Kidney stones   . MRSA infection 2010,2013  . Ovarian cyst    OB History  Gravida Para Term Preterm AB Living  4 1 1  0 2 1  SAB IAB Ectopic Multiple Live Births  1     0 1    # Outcome Date GA Lbr Len/2nd Weight Sex Delivery Anes PTL Lv  4 Current           3 Term 06/08/17 [redacted]w[redacted]d  5 lb 11.4 oz (2.59 kg) F CS-LTranv EPI  LIV  2 AB  [redacted]w[redacted]d       FD  1 SAB            Past Surgical History:  Procedure Laterality Date  . CESAREAN SECTION N/A 06/08/2017   Procedure: CESAREAN SECTION;  Surgeon: 08/08/2017, MD;  Location: [facility] BIRTHING SUITES;  Service: Obstetrics;  Laterality: N/A;  . CHOLECYSTECTOMY N/A 01/15/2018   Procedure: LAPAROSCOPIC CHOLECYSTECTOMY WITH INTRAOPERATIVE CHOLANGIOGRAM ERAS PATHWAY;  Surgeon: 03/17/2018, MD;  Location: [facility] ORS;  Service: General;  Laterality: N/A;  . DILATION AND EVACUATION N/A 05/14/2016   Procedure: DILATATION AND EVACUATION (D&E) 2ND TRIMESTER WITH ULTRA SOUND GUIDANCE;  Surgeon: 07/12/2016, MD;  Location: [facility] ORS;  Service: Gynecology;  Laterality: N/A;  . miscarriage    . TOOTH EXTRACTION     Family History   Problem Relation Age of Onset  . Diabetes Mother   . Hypertension Mother   . Heart disease Maternal Grandfather   . Kidney cancer Maternal Grandmother   . Lung cancer Paternal Grandmother    Social History   Tobacco Use  . Smoking status: Never Smoker  . Smokeless tobacco: Never Used  Vaping Use  . Vaping Use: Never used  Substance Use Topics  . Alcohol use: No  . Drug use: Not Currently    Types: Marijuana    Comment: February 15, 2020   No Known Allergies Medications Prior to Admission  Medication Sig Dispense Refill Last Dose  . buprenorphine (SUBUTEX) 8 MG SUBL SL tablet Place 8 mg under the tongue 2 (two) times daily.  0   . dicyclomine (BENTYL) 10 MG capsule Take 1 capsule (10 mg total) by mouth 4 (four) times daily as needed for up to 7 doses for spasms. 7 capsule 0   . Multiple Vitamin (MULTIVITAMIN WITH MINERALS) TABS tablet Take 1 tablet by mouth daily.     February 17, 2020 oxyCODONE (OXY IR/ROXICODONE) 5 MG immediate release tablet Take 1 tablet (5 mg total) by mouth every 8 (eight) hours as  needed for severe pain. 10 tablet 0   . valACYclovir (VALTREX) 500 MG tablet Take 500 mg by mouth daily.   1     I have reviewed patient's Past Medical Hx, Surgical Hx, Family Hx, Social Hx, medications and allergies.   ROS:  Review of Systems  Constitutional: Negative for fever.  HENT: Negative for congestion and sore throat.   Eyes: Negative for visual disturbance.  Respiratory: Negative for cough and shortness of breath.   Cardiovascular: Negative for chest pain.  Gastrointestinal: Positive for abdominal pain, nausea and vomiting.  Genitourinary: Negative for flank pain, vaginal bleeding and vaginal discharge.  Neurological: Negative for dizziness and headaches.  All other systems reviewed and are negative.   Physical Exam   Patient Vitals for the past 24 hrs:  BP Temp Temp src Pulse Resp SpO2  04/04/20 0319 134/60 98.3 F (36.8 C) Oral 68 18 100 %  04/03/20 2148 122/67 98.7 F  (37.1 C) Oral 61 18 100 %  04/03/20 1849 132/73 98.3 F (36.8 C) Oral 61 18 100 %  04/03/20 1735 119/73 98 F (36.7 C) Oral 65 18 100 %   Constitutional: Well-developed, well-nourished female in distress (laying sprawled on the bed, moaning and writhing in pain, unable to keep her eyes open for conversing or exam). Vomiting with loud retching throughout initial MAU visit. Cardiovascular: normal rate & rhythm, no murmur Respiratory: normal effort, lung sounds clear throughout GI: Abd soft, tenderness in the RLQ upon pressing but not rebound and negative McBurney's and Psoas signs, gravid appropriate for gestational age. Pos BS x 4 MS: Extremities nontender, no edema, normal ROM Neurologic: Alert and oriented x 4.  GU: no CVA tenderness  Pelvic exam deferred   Labs: Results for orders placed or performed during the hospital encounter of 04/03/20 (from the past 24 hour(s))  CBC with Differential/Platelet     Status: Abnormal   Collection Time: 04/03/20  8:51 PM  Result Value Ref Range   WBC 16.4 (H) 4.0 - 10.5 K/uL   RBC 4.47 3.87 - 5.11 MIL/uL   Hemoglobin 13.7 12.0 - 15.0 g/dL   HCT 64.4 03.4 - 74.2 %   MCV 89.0 80.0 - 100.0 fL   MCH 30.6 26.0 - 34.0 pg   MCHC 34.4 30.0 - 36.0 g/dL   RDW 59.5 63.8 - 75.6 %   Platelets 174 150 - 400 K/uL   nRBC 0.0 0.0 - 0.2 %   Neutrophils Relative % 90 %   Neutro Abs 14.9 (H) 1.7 - 7.7 K/uL   Lymphocytes Relative 7 %   Lymphs Abs 1.1 0.7 - 4.0 K/uL   Monocytes Relative 2 %   Monocytes Absolute 0.3 0.1 - 1.0 K/uL   Eosinophils Relative 0 %   Eosinophils Absolute 0.0 0.0 - 0.5 K/uL   Basophils Relative 0 %   Basophils Absolute 0.0 0.0 - 0.1 K/uL   Immature Granulocytes 1 %   Abs Immature Granulocytes 0.12 (H) 0.00 - 0.07 K/uL  Urinalysis, Routine w reflex microscopic Urine, Clean Catch     Status: Abnormal   Collection Time: 04/03/20 10:28 PM  Result Value Ref Range   Color, Urine YELLOW YELLOW   APPearance CLEAR CLEAR   Specific  Gravity, Urine 1.016 1.005 - 1.030   pH 5.0 5.0 - 8.0   Glucose, UA NEGATIVE NEGATIVE mg/dL   Hgb urine dipstick NEGATIVE NEGATIVE   Bilirubin Urine NEGATIVE NEGATIVE   Ketones, ur 20 (A) NEGATIVE mg/dL   Protein, ur  NEGATIVE NEGATIVE mg/dL   Nitrite NEGATIVE NEGATIVE   Leukocytes,Ua NEGATIVE NEGATIVE   Imaging:  MR PELVIS WO CONTRAST  Result Date: 04/04/2020 CLINICAL DATA:  Eleven weeks pregnant.  Lower abdominal pain. EXAM: MRI ABDOMEN AND PELVIS WITHOUT CONTRAST TECHNIQUE: Multiplanar multisequence MR imaging of the abdomen and pelvis was performed. No intravenous contrast was administered. COMPARISON:  None. FINDINGS: COMBINED FINDINGS FOR BOTH MR ABDOMEN AND PELVIS An abbreviated protocol was utilized as the patient could not tolerate the full length study due to pain. Lower chest: Negative Hepatobiliary: No mass or other parenchymal abnormality identified. Pancreas: No mass, inflammatory changes, or other parenchymal abnormality identified. Spleen:  Within normal limits in size and appearance. Adrenals/Urinary Tract: No masses identified. No evidence of hydronephrosis. Stomach/Bowel: Unremarkable.  Normal appendix. Vascular/Lymphatic:  Normal flow voids Reproductive: Early stage gravid uterus. Other:  None Musculoskeletal: No suspicious bone lesions identified. IMPRESSION: Normal appendix.  No acute finding in the abdomen or pelvis. Electronically Signed   By: Deatra RobinsonKevin  Herman M.D.   On: 04/04/2020 02:13   MR ABDOMEN WO CONTRAST  Result Date: 04/04/2020 CLINICAL DATA:  Eleven weeks pregnant.  Lower abdominal pain. EXAM: MRI ABDOMEN AND PELVIS WITHOUT CONTRAST TECHNIQUE: Multiplanar multisequence MR imaging of the abdomen and pelvis was performed. No intravenous contrast was administered. COMPARISON:  None. FINDINGS: COMBINED FINDINGS FOR BOTH MR ABDOMEN AND PELVIS An abbreviated protocol was utilized as the patient could not tolerate the full length study due to pain. Lower chest: Negative  Hepatobiliary: No mass or other parenchymal abnormality identified. Pancreas: No mass, inflammatory changes, or other parenchymal abnormality identified. Spleen:  Within normal limits in size and appearance. Adrenals/Urinary Tract: No masses identified. No evidence of hydronephrosis. Stomach/Bowel: Unremarkable.  Normal appendix. Vascular/Lymphatic:  Normal flow voids Reproductive: Early stage gravid uterus. Other:  None Musculoskeletal: No suspicious bone lesions identified. IMPRESSION: Normal appendix.  No acute finding in the abdomen or pelvis. Electronically Signed   By: Deatra RobinsonKevin  Herman M.D.   On: 04/04/2020 02:13    MAU Course: Orders Placed This Encounter  Procedures  . MR PELVIS WO CONTRAST  . MR ABDOMEN WO CONTRAST  . CBC with Differential/Platelet  . Urinalysis, Routine w reflex microscopic Urine, Clean Catch  . Discharge patient   Meds ordered this encounter  Medications  . promethazine (PHENERGAN) 25 mg in sodium chloride 0.9 % 1,000 mL infusion  . HYDROmorphone (DILAUDID) injection 1 mg  . haloperidol lactate (HALDOL) injection 5 mg  . dexamethasone (DECADRON) injection 10 mg  . ondansetron (ZOFRAN) injection 4 mg  . ondansetron (ZOFRAN ODT) 8 MG disintegrating tablet    Sig: Take 1 tablet (8 mg total) by mouth every 8 (eight) hours as needed for nausea or vomiting.    Dispense:  20 tablet    Refill:  0    Order Specific Question:   Supervising Provider    Answer:   Samara SnidePRATT, TANYA S [[id number]]    MDM: Reviewed pt presentation and past MAU visits with Dr. Debroah LoopArnold who agreed she needed to rule out an acute abdomen despite negative McBurney's/Psoas. Also discussed pain management and agreed dilaudid was best treatment in this circumstance.  Dilaudid and phenergan IV given with an LR bolus, pt reported nausea was better and pain decreased to 6/10. She initially declined MRI and requested CT, but then changed her mind and agreed to MRI.   Pt still complaining of pain and nausea with  vomiting upon return to MAU from radiology. Zofran, Haldol and decadron given with complete  relief of abdominal pain and nausea. Pt requested zofran prescription at discharge.  Assessment: 1. Cannabinoid hyperemesis syndrome   2. GBS (group b Streptococcus) UTI complicating pregnancy, third trimester   3. Pain associated with hyperemesis   4. Right lower quadrant abdominal pain     Plan: Discharge home in stable condition with return precautions.  Patient counseled not to use marijuana to cope with nausea in pregnancy  Follow-up Information    Ob/[facility], [place]. Go to.   Specialty: Obstetrics and Gynecology Why: as scheduled for ongoing prenatal care Contact information: [address] [place] [address] [phone]               Allergies as of 04/04/2020   No Known Allergies     Medication List    TAKE these medications   buprenorphine 8 MG Subl SL tablet Commonly known as: SUBUTEX Place 8 mg under the tongue 2 (two) times daily.   dicyclomine 10 MG capsule Commonly known as: Bentyl Take 1 capsule (10 mg total) by mouth 4 (four) times daily as needed for up to 7 doses for spasms.   multivitamin with minerals Tabs tablet Take 1 tablet by mouth daily.   ondansetron 8 MG disintegrating tablet Commonly known as: Zofran ODT Take 1 tablet (8 mg total) by mouth every 8 (eight) hours as needed for nausea or vomiting.   oxyCODONE 5 MG immediate release tablet Commonly known as: Oxy IR/ROXICODONE Take 1 tablet (5 mg total) by mouth every 8 (eight) hours as needed for severe pain.   valACYclovir 500 MG tablet Commonly known as: VALTREX Take 500 mg by mouth daily.       Edd Arbour, CNM, MSN, [facility] 04/04/20 4:24 AM

## 2020-04-03 NOTE — ED Notes (Signed)
Pt assessed by APP. Report given to MAU charge. Pt transferred to MAU via transport.

## 2020-04-03 NOTE — MAU Note (Signed)
Pt c/o nausea, vomiting and abdominal pain that started Christmas eve.  She is [redacted]wks pregnant.  Has brown discharge.

## 2020-04-03 NOTE — ED Provider Notes (Signed)
34 year old G4P1 presents to the ED due to severe bilateral lower abdominal pain, worse in RLQ, associate with nausea and vomiting.  Patient is currently [redacted] weeks pregnant.  Admits to brown vaginal discharge.  Denies obvious bloody discharge and vaginal fluid.  Upon arrival, vitals all within normal limits.  Patient is afebrile, not tachycardic or hypoxic.  Abdomen soft, non-distended with RLQ tenderness.   Discussed case with Sam in MAU and explained I cannot rule out appendicitis given location of tenderness on exam. Sam agrees to accept patient to MAU to rule out OB etiology. Patient stable for discharge.    Mannie Stabile, PA-C 04/03/20 1746    Rolan Bucco, MD 04/03/20 Harrietta Guardian

## 2020-04-04 ENCOUNTER — Inpatient Hospital Stay (HOSPITAL_COMMUNITY): Payer: Managed Care, Other (non HMO)

## 2020-04-04 MED ORDER — DEXAMETHASONE SODIUM PHOSPHATE 10 MG/ML IJ SOLN
10.0000 mg | Freq: Once | INTRAMUSCULAR | Status: AC
  Administered 2020-04-04: 03:00:00 10 mg via INTRAVENOUS
  Filled 2020-04-04: qty 1

## 2020-04-04 MED ORDER — HALOPERIDOL LACTATE 5 MG/ML IJ SOLN
5.0000 mg | Freq: Once | INTRAMUSCULAR | Status: AC
  Administered 2020-04-04: 03:00:00 5 mg via INTRAVENOUS
  Filled 2020-04-04: qty 1

## 2020-04-04 MED ORDER — ONDANSETRON 8 MG PO TBDP: 8.0000 mg | ORAL_TABLET | Freq: Three times a day (TID) | ORAL | 0 refills | Status: DC | PRN

## 2020-04-04 MED ORDER — ONDANSETRON HCL 4 MG/2ML IJ SOLN
4.0000 mg | Freq: Once | INTRAMUSCULAR | Status: AC
  Administered 2020-04-04: 04:00:00 4 mg via INTRAVENOUS
  Filled 2020-04-04: qty 2

## 2020-04-04 NOTE — Discharge Instructions (Signed)
Hyperemesis Gravidarum Hyperemesis gravidarum is a severe form of nausea and vomiting that happens during pregnancy. Hyperemesis is worse than morning sickness. It may cause you to have nausea or vomiting all day for many days. It may keep you from eating and drinking enough food and liquids, which can lead to dehydration, malnutrition, and weight loss. Hyperemesis usually occurs during the first half (the first 20 weeks) of pregnancy. It often goes away once a woman is in her second half of pregnancy. However, sometimes hyperemesis continues through an entire pregnancy. What are the causes? The cause of this condition is not known. It may be related to changes in chemicals (hormones) in the body during pregnancy, such as the high level of pregnancy hormone (human chorionic gonadotropin) or the increase in the female sex hormone (estrogen). What are the signs or symptoms? Symptoms of this condition include:  Nausea that does not go away.  Vomiting that does not allow you to keep any food down.  Weight loss.  Body fluid loss (dehydration).  Having no desire to eat, or not liking food that you have previously enjoyed. How is this diagnosed? This condition may be diagnosed based on:  A physical exam.  Your medical history.  Your symptoms.  Blood tests.  Urine tests. How is this treated? This condition is managed by controlling symptoms. This may include:  Following an eating plan. This can help lessen nausea and vomiting.  Taking prescription medicines. An eating plan and medicines are often used together to help control symptoms. If medicines do not help relieve nausea and vomiting, you may need to receive fluids through an IV at the hospital. Follow these instructions at home: DO NOT USE MARIJUANA Eating and drinking   Avoid the following ? Drinking fluids with meals. Try not to drink anything during the 30 minutes before and after your meals. ? Drinking more than 1 cup of  fluid at a time. ? Eating foods that trigger your symptoms. These may include spicy foods, coffee, high-fat foods, very sweet foods, and acidic foods. ? Skipping meals. Nausea can be more intense on an empty stomach. If you cannot tolerate food, do not force it. Try sucking on ice chips or other frozen items and make up for missed calories later. ? Lying down within 2 hours after eating. ? Being exposed to environmental triggers. These may include food smells, smoky rooms, closed spaces, rooms with strong smells, warm or humid places, overly loud and noisy rooms, and rooms with motion or flickering lights. Try eating meals in a well-ventilated area that is free of strong smells. ? Quick and sudden changes in your movement. ? Taking iron pills and multivitamins that contain iron. If you take prescription iron pills, do not stop taking them unless your health care provider approves. ? Preparing food. The smell of food can spoil your appetite or trigger nausea.  To help relieve your symptoms: ? Listen to your body. Everyone is different and has different preferences. Find what works best for you. ? Eat and drink slowly. ? Eat 5-6 small meals daily instead of 3 large meals. Eating small meals and snacks can help you avoid an empty stomach. ? In the morning, before getting out of bed, eat a couple of crackers to avoid moving around on an empty stomach. ? Try eating starchy foods as these are usually tolerated well. Examples include cereal, toast, bread, potatoes, pasta, rice, and pretzels. ? Include at least 1 serving of protein with your meals and  snacks. Protein options include lean meats, poultry, seafood, beans, nuts, nut butters, eggs, cheese, and yogurt. ? Try eating a protein-rich snack before bed. Examples of a protein-rick snack include cheese and crackers or a peanut butter sandwich made with 1 slice of whole-wheat bread and 1 tsp (5 g) of peanut butter. ? Eat or suck on things that have ginger  in them. It may help relieve nausea. Add  tsp ground ginger to hot tea or choose ginger tea. ? Try drinking 100% fruit juice or an electrolyte drink. An electrolyte drink contains sodium, potassium, and chloride. ? Drink fluids that are cold, clear, and carbonated or sour. Examples include lemonade, ginger ale, lemon-lime soda, ice water, and sparkling water. ? Brush your teeth or use a mouth rinse after meals. ? Talk with your health care provider about starting a supplement of vitamin B6. General instructions  Take over-the-counter and prescription medicines only as told by your health care provider.  Follow instructions from your health care provider about eating or drinking restrictions.  Continue to take your prenatal vitamins as told by your health care provider. If you are having trouble taking your prenatal vitamins, talk with your health care provider about different options.  Keep all follow-up and pre-birth (prenatal) visits as told by your health care provider. This is important. Contact a health care provider if:  You have pain in your abdomen.  You have a severe headache.  You have vision problems.  You are losing weight.  You feel weak or dizzy. Get help right away if:  You cannot drink fluids without vomiting.  You vomit blood.  You have constant nausea and vomiting.  You are very weak.  You faint.  You have a fever and your symptoms suddenly get worse. Summary  Hyperemesis gravidarum is a severe form of nausea and vomiting that happens during pregnancy.  Making some changes to your eating habits may help relieve nausea and vomiting.  This condition may be managed with medicine.  If medicines do not help relieve nausea and vomiting, you may need to receive fluids through an IV at the hospital. This information is not intended to replace advice given to you by your health care provider. Make sure you discuss any questions you have with your health care  provider. Document Revised: 04/14/2017 Document Reviewed: 11/22/2015 Elsevier Patient Education  2020 [employer].

## 2020-04-10 DIAGNOSIS — O99211 Obesity complicating pregnancy, first trimester: Secondary | ICD-10-CM | POA: Diagnosis not present

## 2020-04-10 DIAGNOSIS — F1120 Opioid dependence, uncomplicated: Secondary | ICD-10-CM | POA: Diagnosis not present

## 2020-04-10 DIAGNOSIS — Z3A12 12 weeks gestation of pregnancy: Secondary | ICD-10-CM | POA: Diagnosis not present

## 2020-04-10 DIAGNOSIS — O26899 Other specified pregnancy related conditions, unspecified trimester: Secondary | ICD-10-CM | POA: Diagnosis not present

## 2020-04-12 ENCOUNTER — Other Ambulatory Visit: Payer: Self-pay

## 2020-04-12 ENCOUNTER — Inpatient Hospital Stay (HOSPITAL_COMMUNITY)
Admission: AD | Admit: 2020-04-12 | Discharge: 2020-04-13 | Disposition: A | Discharge status: Home / Self Care | Payer: BC Managed Care – PPO | Attending: Obstetrics and Gynecology | Admitting: Obstetrics and Gynecology

## 2020-04-12 DIAGNOSIS — Z9049 Acquired absence of other specified parts of digestive tract: Secondary | ICD-10-CM | POA: Insufficient documentation

## 2020-04-12 DIAGNOSIS — Z8619 Personal history of other infectious and parasitic diseases: Secondary | ICD-10-CM | POA: Insufficient documentation

## 2020-04-12 DIAGNOSIS — O99321 Drug use complicating pregnancy, first trimester: Secondary | ICD-10-CM | POA: Diagnosis not present

## 2020-04-12 DIAGNOSIS — O4691 Antepartum hemorrhage, unspecified, first trimester: Secondary | ICD-10-CM | POA: Diagnosis not present

## 2020-04-12 DIAGNOSIS — Z79899 Other long term (current) drug therapy: Secondary | ICD-10-CM | POA: Diagnosis not present

## 2020-04-12 DIAGNOSIS — B951 Streptococcus, group B, as the cause of diseases classified elsewhere: Secondary | ICD-10-CM

## 2020-04-12 DIAGNOSIS — O209 Hemorrhage in early pregnancy, unspecified: Secondary | ICD-10-CM | POA: Diagnosis not present

## 2020-04-12 DIAGNOSIS — Z3A13 13 weeks gestation of pregnancy: Secondary | ICD-10-CM | POA: Diagnosis not present

## 2020-04-12 NOTE — MAU Note (Signed)
I was at work and walked more today (works at [employer]). About 1700 went to BR and saw bright blood. Bleeding continues. No pain.

## 2020-04-12 NOTE — MAU Note (Signed)
First call from lobby.

## 2020-04-12 NOTE — Progress Notes (Signed)
Pt called 2nd time and not in lobby

## 2020-04-13 ENCOUNTER — Encounter (HOSPITAL_COMMUNITY): Payer: Self-pay | Admitting: Obstetrics and Gynecology

## 2020-04-13 DIAGNOSIS — O4691 Antepartum hemorrhage, unspecified, first trimester: Secondary | ICD-10-CM

## 2020-04-13 DIAGNOSIS — Z3A13 13 weeks gestation of pregnancy: Secondary | ICD-10-CM | POA: Diagnosis not present

## 2020-04-13 NOTE — Discharge Instructions (Signed)

## 2020-04-13 NOTE — MAU Note (Signed)
Fetal heart tones noted by bedside u/s by Philipp Deputy CNM.

## 2020-04-13 NOTE — MAU Provider Note (Signed)
History     CSN: [id number]  Arrival date and time: 04/12/20 1906   None     Chief Complaint  Patient presents with  . Vaginal Bleeding   HPI [name] is a 35yo female [phone] at 13.2wks who presents for eval of vag bldg which began suddenly this evening and has subsequently slowed down. Denies abd pain/cramping. She has established her [facility] and had an u/s on 04/10/20 which was normal. Blood type O+. U/S report from 12/1 showed [facility]. Wet prep and GC/chlam neg 02/22/20.  OB History    Gravida  4   Para  1   Term  1   Preterm  0   AB  2   Living  1     SAB  1   IAB      Ectopic      Multiple  0   Live Births  1           Past Medical History:  Diagnosis Date  . Anxiety   . Drug addiction (HCC)    subutex  . Hepatitis    hepatitis C  . Kidney stones   . MRSA infection 2010,2013  . Ovarian cyst     Past Surgical History:  Procedure Laterality Date  . CESAREAN SECTION N/A 06/08/2017   Procedure: CESAREAN SECTION;  Surgeon: Geryl Rankins, MD;  Location: [facility] BIRTHING SUITES;  Service: Obstetrics;  Laterality: N/A;  . CHOLECYSTECTOMY N/A 01/15/2018   Procedure: LAPAROSCOPIC CHOLECYSTECTOMY WITH INTRAOPERATIVE CHOLANGIOGRAM ERAS PATHWAY;  Surgeon: Gaynelle Adu, MD;  Location: [facility] ORS;  Service: General;  Laterality: N/A;  . DILATION AND EVACUATION N/A 05/14/2016   Procedure: DILATATION AND EVACUATION (D&E) 2ND TRIMESTER WITH ULTRA SOUND GUIDANCE;  Surgeon: Tereso Newcomer, MD;  Location: [facility] ORS;  Service: Gynecology;  Laterality: N/A;  . miscarriage    . TOOTH EXTRACTION      Family History  Problem Relation Age of Onset  . Diabetes Mother   . Hypertension Mother   . Heart disease Maternal Grandfather   . Kidney cancer Maternal Grandmother   . Lung cancer Paternal Grandmother     Social History   Tobacco Use  . Smoking status: Never Smoker  . Smokeless tobacco: Never Used  Vaping Use  . Vaping Use: Never used  Substance Use Topics  .  Alcohol use: No  . Drug use: Not Currently    Types: Marijuana    Comment: February 15, 2020    Allergies: No Known Allergies  Medications Prior to Admission  Medication Sig Dispense Refill Last Dose  . buprenorphine (SUBUTEX) 8 MG SUBL SL tablet Place 8 mg under the tongue 2 (two) times daily.  0 04/12/2020 at Unknown time  . ondansetron (ZOFRAN ODT) 8 MG disintegrating tablet Take 1 tablet (8 mg total) by mouth every 8 (eight) hours as needed for nausea or vomiting. 20 tablet 0 Past Week at Unknown time  . valACYclovir (VALTREX) 500 MG tablet Take 500 mg by mouth daily.   1 04/12/2020 at Unknown time  . dicyclomine (BENTYL) 10 MG capsule Take 1 capsule (10 mg total) by mouth 4 (four) times daily as needed for up to 7 doses for spasms. 7 capsule 0   . Multiple Vitamin (MULTIVITAMIN WITH MINERALS) TABS tablet Take 1 tablet by mouth daily.     Marland Kitchen oxyCODONE (OXY IR/ROXICODONE) 5 MG immediate release tablet Take 1 tablet (5 mg total) by mouth every 8 (eight) hours as needed for severe  pain. 10 tablet 0     Review of Systems No other pertinents other than what is listed in HPI.  Physical Exam   Blood pressure 114/76, pulse 89, temperature 98.6 F (37 C), resp. rate 16, height 5\' 3"  (1.6 m), weight 117 kg.  Physical Exam Constitutional:      Appearance: She is obese.  HENT:     Head: Normocephalic.     Nose: Nose normal.     Mouth/Throat:     Mouth: Mucous membranes are moist.  Cardiovascular:     Rate and Rhythm: Normal rate.  Pulmonary:     Effort: Pulmonary effort is normal.  Abdominal:     Palpations: Abdomen is soft.     Comments: FHTs via bedside u/s: 140s, +FM visualized  Genitourinary:    Comments: SE: sm bloody mucous at cervix; no active bldg; cx C/L Musculoskeletal:        General: Normal range of motion.     Cervical back: Normal range of motion.  Skin:    General: Skin is warm.  Neurological:     General: No focal deficit present.     Mental Status: She is alert.   Psychiatric:        Mood and Affect: Mood normal.     MAU Course  Procedures  MDM Bedside u/s Spec exam  Assessment and Plan  IUP@13 .2wks Vag bldg SCH on 8wk u/s  D/C home with bleeding precautions Keep OB appt on 04/24/20 at [facility] CNM 04/13/2020, 12:41 AM

## 2020-04-13 NOTE — Progress Notes (Signed)
Written and verbal d/c instructions given and understanding voiced. 

## 2020-05-08 DIAGNOSIS — F1120 Opioid dependence, uncomplicated: Secondary | ICD-10-CM | POA: Diagnosis not present

## 2020-05-30 ENCOUNTER — Other Ambulatory Visit: Payer: Self-pay | Admitting: Obstetrics and Gynecology

## 2020-05-30 DIAGNOSIS — Z363 Encounter for antenatal screening for malformations: Secondary | ICD-10-CM

## 2020-05-30 DIAGNOSIS — Z3482 Encounter for supervision of other normal pregnancy, second trimester: Secondary | ICD-10-CM | POA: Diagnosis not present

## 2020-05-30 DIAGNOSIS — O99212 Obesity complicating pregnancy, second trimester: Secondary | ICD-10-CM

## 2020-05-30 DIAGNOSIS — Z36 Encounter for antenatal screening for chromosomal anomalies: Secondary | ICD-10-CM | POA: Diagnosis not present

## 2020-06-02 ENCOUNTER — Ambulatory Visit: Payer: BC Managed Care – PPO | Admitting: *Deleted

## 2020-06-02 ENCOUNTER — Ambulatory Visit: Payer: BC Managed Care – PPO

## 2020-06-02 ENCOUNTER — Other Ambulatory Visit: Payer: Self-pay

## 2020-06-02 ENCOUNTER — Other Ambulatory Visit: Payer: Self-pay | Admitting: Obstetrics

## 2020-06-02 ENCOUNTER — Ambulatory Visit: Payer: BC Managed Care – PPO | Attending: Obstetrics and Gynecology

## 2020-06-02 ENCOUNTER — Encounter: Payer: Self-pay | Admitting: *Deleted

## 2020-06-02 DIAGNOSIS — E669 Obesity, unspecified: Secondary | ICD-10-CM | POA: Diagnosis not present

## 2020-06-02 DIAGNOSIS — B951 Streptococcus, group B, as the cause of diseases classified elsewhere: Secondary | ICD-10-CM | POA: Insufficient documentation

## 2020-06-02 DIAGNOSIS — O98412 Viral hepatitis complicating pregnancy, second trimester: Secondary | ICD-10-CM

## 2020-06-02 DIAGNOSIS — O09292 Supervision of pregnancy with other poor reproductive or obstetric history, second trimester: Secondary | ICD-10-CM

## 2020-06-02 DIAGNOSIS — O99322 Drug use complicating pregnancy, second trimester: Secondary | ICD-10-CM

## 2020-06-02 DIAGNOSIS — Z363 Encounter for antenatal screening for malformations: Secondary | ICD-10-CM | POA: Diagnosis not present

## 2020-06-02 DIAGNOSIS — Z3A23 23 weeks gestation of pregnancy: Secondary | ICD-10-CM

## 2020-06-02 DIAGNOSIS — O34219 Maternal care for unspecified type scar from previous cesarean delivery: Secondary | ICD-10-CM

## 2020-06-02 DIAGNOSIS — O99212 Obesity complicating pregnancy, second trimester: Secondary | ICD-10-CM

## 2020-06-02 DIAGNOSIS — O2343 Unspecified infection of urinary tract in pregnancy, third trimester: Secondary | ICD-10-CM

## 2020-06-02 DIAGNOSIS — Z3A20 20 weeks gestation of pregnancy: Secondary | ICD-10-CM

## 2020-06-02 DIAGNOSIS — B182 Chronic viral hepatitis C: Secondary | ICD-10-CM

## 2020-06-02 DIAGNOSIS — R945 Abnormal results of liver function studies: Secondary | ICD-10-CM

## 2020-06-02 DIAGNOSIS — O09299 Supervision of pregnancy with other poor reproductive or obstetric history, unspecified trimester: Secondary | ICD-10-CM

## 2020-06-02 DIAGNOSIS — O322XX0 Maternal care for transverse and oblique lie, not applicable or unspecified: Secondary | ICD-10-CM

## 2020-06-02 DIAGNOSIS — Z362 Encounter for other antenatal screening follow-up: Secondary | ICD-10-CM

## 2020-06-02 DIAGNOSIS — F1910 Other psychoactive substance abuse, uncomplicated: Secondary | ICD-10-CM

## 2020-06-02 DIAGNOSIS — O26612 Liver and biliary tract disorders in pregnancy, second trimester: Secondary | ICD-10-CM

## 2020-06-05 DIAGNOSIS — F1120 Opioid dependence, uncomplicated: Secondary | ICD-10-CM | POA: Diagnosis not present

## 2020-06-23 ENCOUNTER — Ambulatory Visit: Payer: BC Managed Care – PPO | Admitting: *Deleted

## 2020-06-23 ENCOUNTER — Other Ambulatory Visit: Payer: Self-pay

## 2020-06-23 ENCOUNTER — Ambulatory Visit: Payer: BC Managed Care – PPO | Attending: Obstetrics

## 2020-06-23 ENCOUNTER — Encounter: Payer: Self-pay | Admitting: *Deleted

## 2020-06-23 ENCOUNTER — Other Ambulatory Visit: Payer: Self-pay | Admitting: *Deleted

## 2020-06-23 DIAGNOSIS — O99322 Drug use complicating pregnancy, second trimester: Secondary | ICD-10-CM | POA: Diagnosis not present

## 2020-06-23 DIAGNOSIS — O99212 Obesity complicating pregnancy, second trimester: Secondary | ICD-10-CM

## 2020-06-23 DIAGNOSIS — O2692 Pregnancy related conditions, unspecified, second trimester: Secondary | ICD-10-CM | POA: Diagnosis not present

## 2020-06-23 DIAGNOSIS — O99323 Drug use complicating pregnancy, third trimester: Secondary | ICD-10-CM

## 2020-06-23 DIAGNOSIS — B951 Streptococcus, group B, as the cause of diseases classified elsewhere: Secondary | ICD-10-CM | POA: Diagnosis not present

## 2020-06-23 DIAGNOSIS — O09299 Supervision of pregnancy with other poor reproductive or obstetric history, unspecified trimester: Secondary | ICD-10-CM | POA: Diagnosis not present

## 2020-06-23 DIAGNOSIS — O98412 Viral hepatitis complicating pregnancy, second trimester: Secondary | ICD-10-CM

## 2020-06-23 DIAGNOSIS — Z3A23 23 weeks gestation of pregnancy: Secondary | ICD-10-CM | POA: Diagnosis not present

## 2020-06-23 DIAGNOSIS — O34219 Maternal care for unspecified type scar from previous cesarean delivery: Secondary | ICD-10-CM | POA: Diagnosis not present

## 2020-06-23 DIAGNOSIS — Z362 Encounter for other antenatal screening follow-up: Secondary | ICD-10-CM | POA: Diagnosis not present

## 2020-06-23 DIAGNOSIS — F1910 Other psychoactive substance abuse, uncomplicated: Secondary | ICD-10-CM | POA: Insufficient documentation

## 2020-06-23 DIAGNOSIS — E669 Obesity, unspecified: Secondary | ICD-10-CM | POA: Diagnosis not present

## 2020-06-23 DIAGNOSIS — O09292 Supervision of pregnancy with other poor reproductive or obstetric history, second trimester: Secondary | ICD-10-CM

## 2020-06-23 DIAGNOSIS — F1911 Other psychoactive substance abuse, in remission: Secondary | ICD-10-CM

## 2020-06-23 DIAGNOSIS — B1920 Unspecified viral hepatitis C without hepatic coma: Secondary | ICD-10-CM

## 2020-06-23 DIAGNOSIS — F1120 Opioid dependence, uncomplicated: Secondary | ICD-10-CM | POA: Diagnosis not present

## 2020-06-23 DIAGNOSIS — O2342 Unspecified infection of urinary tract in pregnancy, second trimester: Secondary | ICD-10-CM | POA: Insufficient documentation

## 2020-06-23 DIAGNOSIS — R945 Abnormal results of liver function studies: Secondary | ICD-10-CM

## 2020-06-23 DIAGNOSIS — O26612 Liver and biliary tract disorders in pregnancy, second trimester: Secondary | ICD-10-CM

## 2020-07-03 DIAGNOSIS — F1120 Opioid dependence, uncomplicated: Secondary | ICD-10-CM | POA: Diagnosis not present

## 2020-07-24 ENCOUNTER — Other Ambulatory Visit: Payer: Self-pay

## 2020-07-24 ENCOUNTER — Ambulatory Visit: Payer: BC Managed Care – PPO | Attending: Obstetrics and Gynecology

## 2020-07-24 ENCOUNTER — Ambulatory Visit: Payer: BC Managed Care – PPO | Admitting: *Deleted

## 2020-07-24 ENCOUNTER — Encounter: Payer: Self-pay | Admitting: *Deleted

## 2020-07-24 DIAGNOSIS — B951 Streptococcus, group B, as the cause of diseases classified elsewhere: Secondary | ICD-10-CM | POA: Insufficient documentation

## 2020-07-24 DIAGNOSIS — O2343 Unspecified infection of urinary tract in pregnancy, third trimester: Secondary | ICD-10-CM | POA: Insufficient documentation

## 2020-07-24 DIAGNOSIS — F1911 Other psychoactive substance abuse, in remission: Secondary | ICD-10-CM | POA: Diagnosis not present

## 2020-07-25 ENCOUNTER — Other Ambulatory Visit: Payer: Self-pay | Admitting: *Deleted

## 2020-07-25 DIAGNOSIS — B182 Chronic viral hepatitis C: Secondary | ICD-10-CM

## 2020-07-25 DIAGNOSIS — R945 Abnormal results of liver function studies: Secondary | ICD-10-CM

## 2020-07-25 DIAGNOSIS — Z3483 Encounter for supervision of other normal pregnancy, third trimester: Secondary | ICD-10-CM | POA: Diagnosis not present

## 2020-07-25 DIAGNOSIS — Z23 Encounter for immunization: Secondary | ICD-10-CM | POA: Diagnosis not present

## 2020-07-25 DIAGNOSIS — O98412 Viral hepatitis complicating pregnancy, second trimester: Secondary | ICD-10-CM

## 2020-07-25 DIAGNOSIS — Z3490 Encounter for supervision of normal pregnancy, unspecified, unspecified trimester: Secondary | ICD-10-CM | POA: Diagnosis not present

## 2020-07-25 DIAGNOSIS — Z3A27 27 weeks gestation of pregnancy: Secondary | ICD-10-CM

## 2020-07-25 DIAGNOSIS — O99212 Obesity complicating pregnancy, second trimester: Secondary | ICD-10-CM | POA: Diagnosis not present

## 2020-07-25 DIAGNOSIS — O99322 Drug use complicating pregnancy, second trimester: Secondary | ICD-10-CM | POA: Diagnosis not present

## 2020-07-25 DIAGNOSIS — O26612 Liver and biliary tract disorders in pregnancy, second trimester: Secondary | ICD-10-CM

## 2020-07-25 DIAGNOSIS — O09292 Supervision of pregnancy with other poor reproductive or obstetric history, second trimester: Secondary | ICD-10-CM

## 2020-07-25 DIAGNOSIS — Z362 Encounter for other antenatal screening follow-up: Secondary | ICD-10-CM | POA: Diagnosis not present

## 2020-07-25 DIAGNOSIS — E669 Obesity, unspecified: Secondary | ICD-10-CM

## 2020-07-25 DIAGNOSIS — F1910 Other psychoactive substance abuse, uncomplicated: Secondary | ICD-10-CM | POA: Diagnosis not present

## 2020-07-25 DIAGNOSIS — Z6841 Body Mass Index (BMI) 40.0 and over, adult: Secondary | ICD-10-CM

## 2020-07-25 DIAGNOSIS — O34219 Maternal care for unspecified type scar from previous cesarean delivery: Secondary | ICD-10-CM

## 2020-07-31 DIAGNOSIS — F1120 Opioid dependence, uncomplicated: Secondary | ICD-10-CM | POA: Diagnosis not present

## 2020-08-01 DIAGNOSIS — R7309 Other abnormal glucose: Secondary | ICD-10-CM | POA: Diagnosis not present

## 2020-08-08 ENCOUNTER — Other Ambulatory Visit: Payer: Self-pay

## 2020-08-08 ENCOUNTER — Inpatient Hospital Stay (HOSPITAL_COMMUNITY): Payer: BC Managed Care – PPO

## 2020-08-08 ENCOUNTER — Inpatient Hospital Stay (HOSPITAL_COMMUNITY)
Admission: AD | Admit: 2020-08-08 | Discharge: 2020-08-08 | Disposition: A | Discharge status: Home / Self Care | Payer: BC Managed Care – PPO | Attending: Obstetrics and Gynecology | Admitting: Obstetrics and Gynecology

## 2020-08-08 ENCOUNTER — Encounter (HOSPITAL_COMMUNITY): Payer: Self-pay | Admitting: Obstetrics and Gynecology

## 2020-08-08 DIAGNOSIS — Z20822 Contact with and (suspected) exposure to covid-19: Secondary | ICD-10-CM | POA: Insufficient documentation

## 2020-08-08 DIAGNOSIS — K5909 Other constipation: Secondary | ICD-10-CM

## 2020-08-08 DIAGNOSIS — O99891 Other specified diseases and conditions complicating pregnancy: Secondary | ICD-10-CM

## 2020-08-08 DIAGNOSIS — O26893 Other specified pregnancy related conditions, third trimester: Secondary | ICD-10-CM | POA: Diagnosis not present

## 2020-08-08 DIAGNOSIS — O219 Vomiting of pregnancy, unspecified: Secondary | ICD-10-CM

## 2020-08-08 DIAGNOSIS — R109 Unspecified abdominal pain: Secondary | ICD-10-CM | POA: Diagnosis not present

## 2020-08-08 DIAGNOSIS — Z3A30 30 weeks gestation of pregnancy: Secondary | ICD-10-CM | POA: Diagnosis not present

## 2020-08-08 DIAGNOSIS — O34219 Maternal care for unspecified type scar from previous cesarean delivery: Secondary | ICD-10-CM | POA: Insufficient documentation

## 2020-08-08 DIAGNOSIS — R1084 Generalized abdominal pain: Secondary | ICD-10-CM | POA: Diagnosis not present

## 2020-08-08 DIAGNOSIS — O212 Late vomiting of pregnancy: Secondary | ICD-10-CM

## 2020-08-08 DIAGNOSIS — R519 Headache, unspecified: Secondary | ICD-10-CM | POA: Diagnosis not present

## 2020-08-08 LAB — URINALYSIS, ROUTINE W REFLEX MICROSCOPIC
Bilirubin Urine: NEGATIVE
Glucose, UA: NEGATIVE mg/dL
Hgb urine dipstick: NEGATIVE
Ketones, ur: 20 mg/dL — AB
Leukocytes,Ua: NEGATIVE
Nitrite: NEGATIVE
Protein, ur: NEGATIVE mg/dL
Specific Gravity, Urine: 1.018 (ref 1.005–1.030)
pH: 5 (ref 5.0–8.0)

## 2020-08-08 LAB — CBC
HCT: 35.2 % — ABNORMAL LOW (ref 36.0–46.0)
Hemoglobin: 12 g/dL (ref 12.0–15.0)
MCH: 30.7 pg (ref 26.0–34.0)
MCHC: 34.1 g/dL (ref 30.0–36.0)
MCV: 90 fL (ref 80.0–100.0)
Platelets: 149 10*3/uL — ABNORMAL LOW (ref 150–400)
RBC: 3.91 MIL/uL (ref 3.87–5.11)
RDW: 13.7 % (ref 11.5–15.5)
WBC: 14.4 10*3/uL — ABNORMAL HIGH (ref 4.0–10.5)
nRBC: 0 % (ref 0.0–0.2)

## 2020-08-08 LAB — WET PREP, GENITAL
Clue Cells Wet Prep HPF POC: NONE SEEN
Sperm: NONE SEEN
Trich, Wet Prep: NONE SEEN
Yeast Wet Prep HPF POC: NONE SEEN

## 2020-08-08 LAB — COMPREHENSIVE METABOLIC PANEL
ALT: 50 U/L — ABNORMAL HIGH (ref 0–44)
AST: 40 U/L (ref 15–41)
Albumin: 3.3 g/dL — ABNORMAL LOW (ref 3.5–5.0)
Alkaline Phosphatase: 84 U/L (ref 38–126)
Anion gap: 10 (ref 5–15)
BUN: 6 mg/dL (ref 6–20)
CO2: 21 mmol/L — ABNORMAL LOW (ref 22–32)
Calcium: 8.7 mg/dL — ABNORMAL LOW (ref 8.9–10.3)
Chloride: 104 mmol/L (ref 98–111)
Creatinine, Ser: 0.69 mg/dL (ref 0.44–1.00)
GFR, Estimated: >60 mL/min (ref >60)
Glucose, Bld: 128 mg/dL — ABNORMAL HIGH (ref 70–99)
Potassium: 3.9 mmol/L (ref 3.5–5.1)
Sodium: 135 mmol/L (ref 135–145)
Total Bilirubin: 0.6 mg/dL (ref 0.3–1.2)
Total Protein: 6.6 g/dL (ref 6.5–8.1)

## 2020-08-08 LAB — RAPID URINE DRUG SCREEN, HOSP PERFORMED
Amphetamines: NOT DETECTED
Barbiturates: NOT DETECTED
Benzodiazepines: NOT DETECTED
Cocaine: NOT DETECTED
Opiates: NOT DETECTED
Tetrahydrocannabinol: POSITIVE — AB

## 2020-08-08 LAB — RESP PANEL BY RT-PCR (FLU A&B, COVID) ARPGX2
Influenza A by PCR: NEGATIVE
Influenza B by PCR: NEGATIVE
SARS Coronavirus 2 by RT PCR: NEGATIVE

## 2020-08-08 LAB — PROTEIN / CREATININE RATIO, URINE
Creatinine, Urine: 157.79 mg/dL
Protein Creatinine Ratio: 0.09 mg/mg{Cre} (ref 0.00–0.15)
Total Protein, Urine: 14 mg/dL

## 2020-08-08 MED ORDER — ALUM & MAG HYDROXIDE-SIMETH 200-200-20 MG/5ML PO SUSP
30.0000 mL | Freq: Once | ORAL | Status: AC
  Administered 2020-08-08: 30 mL via ORAL
  Filled 2020-08-08: qty 30

## 2020-08-08 MED ORDER — ACETAMINOPHEN 325 MG PO TABS
650.0000 mg | ORAL_TABLET | Freq: Once | ORAL | Status: AC
  Administered 2020-08-08: 650 mg via ORAL
  Filled 2020-08-08: qty 2

## 2020-08-08 MED ORDER — BUTALBITAL-APAP-CAFFEINE 50-325-40 MG PO TABS
1.0000 | ORAL_TABLET | Freq: Once | ORAL | Status: AC
  Administered 2020-08-08: 1 via ORAL
  Filled 2020-08-08: qty 1

## 2020-08-08 MED ORDER — ACETAMINOPHEN 500 MG PO TABS: 1000.0000 mg | ORAL_TABLET | Freq: Once | ORAL | Status: DC

## 2020-08-08 MED ORDER — SODIUM CHLORIDE 0.9 % IV SOLN
12.5000 mg | Freq: Once | INTRAVENOUS | Status: AC
  Administered 2020-08-08: 12.5 mg via INTRAVENOUS
  Filled 2020-08-08: qty 0.5

## 2020-08-08 MED ORDER — ONDANSETRON 4 MG PO TBDP
8.0000 mg | ORAL_TABLET | Freq: Once | ORAL | Status: AC
  Administered 2020-08-08: 8 mg via ORAL
  Filled 2020-08-08: qty 2

## 2020-08-08 MED ORDER — POLYETHYLENE GLYCOL 3350 17 G PO PACK: 17.0000 g | PACK | Freq: Every day | ORAL | 0 refills | Status: DC

## 2020-08-08 MED ORDER — FAMOTIDINE 20 MG PO TABS: 20.0000 mg | ORAL_TABLET | Freq: Two times a day (BID) | ORAL | 1 refills | Status: DC

## 2020-08-08 MED ORDER — ONDANSETRON 8 MG PO TBDP: 8.0000 mg | ORAL_TABLET | Freq: Three times a day (TID) | ORAL | 0 refills | Status: DC | PRN

## 2020-08-08 MED ORDER — LACTATED RINGERS IV BOLUS
1000.0000 mL | Freq: Once | INTRAVENOUS | Status: AC
  Administered 2020-08-08: 1000 mL via INTRAVENOUS

## 2020-08-08 NOTE — MAU Provider Note (Signed)
Chief Complaint:  Abdominal Pain, Headache, Nausea, Emesis   Event Date/Time   First Provider Initiated Contact with Patient 08/08/20 1601     HPI: Abigail Fox is a 35 y.o. G4P1021 at [redacted]w[redacted]d who presents to maternity admissions reporting several days of sudden onset severe abdominal pain and headache. Pt also reports recurrent vomiting ("too many times to count today"). She was last able to eat on 5/1 in the evening. Pt presented to MAU today given lack of improvement and need for symptom relief. She is prescribed subutex and had her last dose this morning. She denies any other medications for pain. Denies recent substance use. No known sick contacts. 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 She is very stressed about missing work. She has a history of prior Cesarean at 39 weeks as well as history of preeclampsia with prior to pregnancy. She desires repeat Cesarean. She also has a history of elevated LFTs.   She reports good fetal movement, denies LOF, vaginal bleeding, vaginal itching/burning, urinary symptoms, or fever/chills.    Past Medical History: Past Medical History:  Diagnosis Date  . Anxiety   . Drug addiction (HCC)    subutex  . Hepatitis    hepatitis C  . Kidney stones   . MRSA infection 2010,2013  . Ovarian cyst     Past obstetric history: OB History  Gravida Para Term Preterm AB Living  0 2 1  SAB IAB Ectopic Multiple Live Births  1     0 1    # Outcome Date GA Lbr Len/2nd Weight Sex Delivery Anes PTL Lv  4 Current           3 Term 06/08/17 [redacted]w[redacted]d  2590 g F CS-LTranv EPI  LIV  2 AB  [redacted]w[redacted]d       FD  1 SAB             Past Surgical History: Past Surgical History:  Procedure Laterality Date  . CESAREAN SECTION N/A 06/08/2017   Procedure: CESAREAN SECTION;  Surgeon: Geryl Rankins, MD;  Location: [facility];  Service: Obstetrics;  Laterality: N/A;  . CHOLECYSTECTOMY N/A 01/15/2018   Procedure: LAPAROSCOPIC CHOLECYSTECTOMY WITH  INTRAOPERATIVE CHOLANGIOGRAM ERAS PATHWAY;  Surgeon: Gaynelle Adu, MD;  Location: [facility] ORS;  Service: General;  Laterality: N/A;  . DILATION AND EVACUATION N/A 05/14/2016   Procedure: DILATATION AND EVACUATION (D&E) 2ND TRIMESTER WITH ULTRA SOUND GUIDANCE;  Surgeon: Tereso Newcomer, MD;  Location: [facility] ORS;  Service: Gynecology;  Laterality: N/A;  . miscarriage    . TOOTH EXTRACTION      Family History: Family History  Problem Relation Age of Onset  . Diabetes Mother   . Hypertension Mother   . Heart disease Maternal Grandfather   . Kidney cancer Maternal Grandmother   . Lung cancer Paternal Grandmother     Social History: Social History   Tobacco Use  . Smoking status: Never Smoker  . Smokeless tobacco: Never Used  Vaping Use  . Vaping Use: Never used  Substance Use Topics  . Alcohol use: No  . Drug use: Not Currently    Types: Marijuana    Comment: February 15, 2020    Allergies: No Known Allergies  Meds:  Medications Prior to Admission  Medication Sig Dispense Refill Last Dose  . buprenorphine (SUBUTEX) 8 MG SUBL SL tablet Place 8 mg under the tongue 2 (two) times daily.  0 08/08/2020 at Unknown time  . Doxylamine-Pyridoxine 10-10  MG TBEC Take by mouth.   08/08/2020 at Unknown time  . Prenatal Vit-Fe Fumarate-FA (PRENATAL MULTIVITAMIN) TABS tablet Take 1 tablet by mouth daily at 12 noon.   08/08/2020 at Unknown time  . valACYclovir (VALTREX) 500 MG tablet Take 500 mg by mouth daily.   1 08/08/2020 at Unknown time  . [DISCONTINUED] ondansetron (ZOFRAN ODT) 8 MG disintegrating tablet Take 1 tablet (8 mg total) by mouth every 8 (eight) hours as needed for nausea or vomiting. 20 tablet 0 08/08/2020 at Unknown time    ROS:  Review of Systems  Constitutional: Negative for chills, fatigue and fever.  HENT: Negative for congestion and sore throat.   Eyes: Positive for visual disturbance. Negative for photophobia.  Respiratory: Negative for chest tightness, shortness of breath and  wheezing.   Cardiovascular: Negative for chest pain.  Gastrointestinal: Positive for abdominal pain, nausea and vomiting.  Genitourinary: Negative for dysuria, vaginal bleeding, vaginal discharge and vaginal pain.  Musculoskeletal: Negative for back pain.  Neurological: Positive for headaches. Negative for seizures.  Psychiatric/Behavioral: The patient is nervous/anxious.    I have reviewed patient's Past Medical Hx, Surgical Hx, Family Hx, Social Hx, medications and allergies.   Physical Exam   Patient Vitals for the past 24 hrs:  BP Temp Temp src Pulse Resp SpO2 Height Weight  08/08/20 1801 (!) 94/47 -- -- 76 -- -- -- --  08/08/20 1800 -- -- -- -- -- 96 % -- --  08/08/20 1755 -- -- -- -- -- 97 % -- --  08/08/20 1750 -- -- -- -- -- 97 % -- --  08/08/20 1746 (!) 106/52 -- -- 70 -- -- -- --  08/08/20 1745 -- -- -- -- -- 98 % -- --  08/08/20 1740 -- -- -- -- -- 100 % -- --  08/08/20 1739 101/63 -- -- 73 -- -- -- --  08/08/20 1726 (!) 102/46 -- -- 74 -- -- -- --  08/08/20 1725 -- -- -- -- -- 95 % -- --  08/08/20 1720 -- -- -- -- -- 96 % -- --  08/08/20 1715 -- -- -- -- -- 97 % -- --  08/08/20 1710 -- -- -- -- -- 98 % -- --  08/08/20 1705 -- -- -- -- -- 95 % -- --  08/08/20 1700 -- -- -- -- -- 98 % -- --  08/08/20 1650 -- -- -- -- -- 97 % -- --  08/08/20 1645 -- -- -- -- -- 98 % -- --  08/08/20 1640 -- -- -- -- -- 98 % -- --  08/08/20 1630 -- -- -- -- -- 97 % -- --  08/08/20 1625 -- -- -- -- -- 98 % -- --  08/08/20 1620 -- -- -- -- -- 98 % -- --  08/08/20 1615 -- -- -- -- -- 98 % -- --  08/08/20 1610 -- -- -- -- -- 99 % -- --  08/08/20 1605 -- -- -- -- -- 97 % -- --  08/08/20 1600 -- -- -- -- -- 96 % -- --  08/08/20 1555 -- -- -- -- -- 97 % -- --  08/08/20 1550 107/73 -- -- 80 -- 98 % -- --  08/08/20 1537 132/84 (!) 97.4 F (36.3 C) Oral 93 20 99 % 5\' 4"  (1.626 m) 117.8 kg   Constitutional: Well-developed, well-nourished female in no acute distress.  Cardiovascular: normal  rate Respiratory: normal effort GI: Abd soft, diffuse mild tenderness to palpation, gravid appropriate for gestational age.  MS: Extremities nontender, no edema, normal ROM Neurologic: Alert and oriented x 4.   PELVIC EXAM: external genitalia normal Bimanual exam: Cervix 0/long/high, firm, anterior, neg CMT  FHT:  Baseline 130, moderate variability, accelerations present, intermittent variable decels appropriate for gestational age Contractions: none visible on toco   Labs: Results for orders placed or performed during the hospital encounter of 08/08/20 (from the past 24 hour(s))  CBC     Status: Abnormal   Collection Time: 08/08/20  4:07 PM  Result Value Ref Range   WBC 14.4 (H) 4.0 - 10.5 K/uL   RBC 3.91 3.87 - 5.11 MIL/uL   Hemoglobin 12.0 12.0 - 15.0 g/dL   HCT 09.8 (L) 11.9 - 14.7 %   MCV 90.0 80.0 - 100.0 fL   MCH 30.7 26.0 - 34.0 pg   MCHC 34.1 30.0 - 36.0 g/dL   RDW 82.9 56.2 - 13.0 %   Platelets 149 (L) 150 - 400 K/uL   nRBC 0.0 0.0 - 0.2 %  Comprehensive metabolic panel     Status: Abnormal   Collection Time: 08/08/20  4:07 PM  Result Value Ref Range   Sodium 135 135 - 145 mmol/L   Potassium 3.9 3.5 - 5.1 mmol/L   Chloride 104 98 - 111 mmol/L   CO2 21 (L) 22 - 32 mmol/L   Glucose, Bld 128 (H) 70 - 99 mg/dL   BUN 6 6 - 20 mg/dL   Creatinine, Ser 8.65 0.44 - 1.00 mg/dL   Calcium 8.7 (L) 8.9 - 10.3 mg/dL   Total Protein 6.6 6.5 - 8.1 g/dL   Albumin 3.3 (L) 3.5 - 5.0 g/dL   AST 40 15 - 41 U/L   ALT 50 (H) 0 - 44 U/L   Alkaline Phosphatase 84 38 - 126 U/L   Total Bilirubin 0.6 0.3 - 1.2 mg/dL   GFR, Estimated >78 >46 mL/min   Anion gap 10 5 - 15  Wet prep, genital     Status: Abnormal   Collection Time: 08/08/20  4:19 PM  Result Value Ref Range   Yeast Wet Prep HPF POC NONE SEEN NONE SEEN   Trich, Wet Prep NONE SEEN NONE SEEN   Clue Cells Wet Prep HPF POC NONE SEEN NONE SEEN   WBC, Wet Prep HPF POC FEW (A) NONE SEEN   Sperm NONE SEEN   Urinalysis, Routine w  reflex microscopic Urine, Clean Catch     Status: Abnormal   Collection Time: 08/08/20  5:43 PM  Result Value Ref Range   Color, Urine YELLOW YELLOW   APPearance CLEAR CLEAR   Specific Gravity, Urine 1.018 1.005 - 1.030   pH 5.0 5.0 - 8.0   Glucose, UA NEGATIVE NEGATIVE mg/dL   Hgb urine dipstick NEGATIVE NEGATIVE   Bilirubin Urine NEGATIVE NEGATIVE   Ketones, ur 20 (A) NEGATIVE mg/dL   Protein, ur NEGATIVE NEGATIVE mg/dL   Nitrite NEGATIVE NEGATIVE   Leukocytes,Ua NEGATIVE NEGATIVE  Protein / creatinine ratio, urine     Status: None   Collection Time: 08/08/20  5:43 PM  Result Value Ref Range   Creatinine, Urine 157.79 mg/dL   Total Protein, Urine 14 mg/dL   Protein Creatinine Ratio 0.09 0.00 - 0.15 mg/mg[Cre]  Urine rapid drug screen (hosp performed)     Status: Abnormal   Collection Time: 08/08/20  5:43 PM  Result Value Ref Range   Opiates NONE DETECTED NONE DETECTED   Cocaine NONE DETECTED NONE DETECTED   Benzodiazepines NONE DETECTED NONE  DETECTED   Amphetamines NONE DETECTED NONE DETECTED   Tetrahydrocannabinol POSITIVE (A) NONE DETECTED   Barbiturates NONE DETECTED NONE DETECTED  Resp Panel by RT-PCR (Flu A&B, Covid) Nasopharyngeal Swab     Status: None   Collection Time: 08/08/20  7:43 PM   Specimen: Nasopharyngeal Swab; Nasopharyngeal(NP) swabs in vial transport medium  Result Value Ref Range   SARS Coronavirus 2 by RT PCR NEGATIVE NEGATIVE   Influenza A by PCR NEGATIVE NEGATIVE   Influenza B by PCR NEGATIVE NEGATIVE   --/--/O POS (11/16 1305)  Imaging:  [place] MFM OB FOLLOW UP  Result Date: 07/25/2020 ----------------------------------------------------------------------  OBSTETRICS REPORT                       (Signed Final 07/25/2020 11:29 am) ---------------------------------------------------------------------- Patient Info  ID #:       [id number]                          D.O.B.:  [birth year] (34 yrs)  Name:       Abigail Fox                  Visit Date: 07/24/2020  03:43 pm ---------------------------------------------------------------------- Performed By  Attending:        Lin Landsman      Referred By:      Steva Ready                    MD  Performed By:     Tommie Raymond BS,       [facility]                                [facility] ---------------------------------------------------------------------- Orders  #  Description                           Code        Ordered By  1  [place] MFM OB FOLLOW UP                   21308.65    RAVI [facility] ----------------------------------------------------------------------  #  Order #                     Accession #                Episode #  1  [id number]  [id number]                 [id number] ---------------------------------------------------------------------- Indications  [redacted] weeks gestation of pregnancy                Z3A.27  Substance abuse affecting pregnancy,           O99.320 F19.10  antepartum (subutex)  Obesity complicating pregnancy, second         O99.212  trimester (BMI 45)  Medical complication of pregnancy (Elevated    O26.90  LFTs, Hep C)  Previous cesarean delivery, antepartum         O34.219  Poor obstetrical history (Fetal demise at 5522   O09.299  weeks)  Encounter for other antenatal screening        Z36.2  follow-up ---------------------------------------------------------------------- Fetal Evaluation  Num Of Fetuses:         1  Fetal Heart Rate(bpm):  129  Cardiac Activity:       Observed  Presentation:           Cephalic  Placenta:               Anterior  P. Cord Insertion:      Previously Visualized  Amniotic Fluid  AFI FV:      Within normal limits  AFI Sum(cm)     %Tile       Largest Pocket(cm)  17.6            67          6.4  RUQ(cm)       RLQ(cm)       LUQ(cm)        LLQ(cm)  4.2            2.7           6.4            4.3 ---------------------------------------------------------------------- Biometry  BPD:      67.1  mm     G. Age:  27w 0d         15  %    CI:        76.98   %    70 - 86                                                          FL/HC:      21.3   %    18.8 - 20.6  HC:      242.2  mm     G. Age:  26w 2d        1.2  %    HC/AC:      1.04        1.05 - 1.21  AC:      232.6  mm     G. Age:  27w 4d         34  %    FL/BPD:     76.8   %    71 - 87  FL:       51.5  mm     G. Age:  27w 4d         26  %    FL/AC:      22.1   %    20 - 24  CER:  31.3  mm     G. Age:  27w 1d         28  %  LV:        2.6  mm  CM:        7.3  mm  Est. FW:    1071  gm      2 lb 6 oz     22  % ---------------------------------------------------------------------- OB History  Gravidity:    4         Term:   1        Prem:   0        SAB:   2  TOP:          0       Ectopic:  0        Living: 1 ---------------------------------------------------------------------- Gestational Age  U/S Today:     27w 1d                                        EDD:   10/22/20  Best:          27w 6d     Det. By:  Marcella Dubs         EDD:   10/17/20                                      (03/08/20) ---------------------------------------------------------------------- Anatomy  Cranium:               Appears normal         LVOT:                   Appears normal  Cavum:                 Appears normal         Aortic Arch:            Appears normal  Ventricles:            Appears normal         Ductal Arch:            Appears normal  Choroid Plexus:        Appears normal         Diaphragm:              Appears normal  Cerebellum:            Appears normal         Stomach:                Appears normal, left                                                                        sided  Posterior Fossa:       Appears normal         Abdomen:                Appears normal  Nuchal Fold:  Not applicable (>20    Abdominal Wall:          Appears nml (cord                         wks GA)                                        insert, abd wall)  Face:                  Orbits and profile     Cord Vessels:           Appears normal (3                         previously seen                                vessel cord)  Lips:                  Appears normal         Kidneys:                Appear normal  Palate:                Not well visualized    Bladder:                Appears normal  Thoracic:              Appears normal         Spine:                  Limited views                                                                        appear normal  Heart:                 Appears normal         Upper Extremities:      Previously seen                         (4CH, axis, and                         situs)  RVOT:                  Appears normal         Lower Extremities:      Previously seen  Other:  Fetus appears to be female. Technically difficult due to maternal          habitus and fetal position. ---------------------------------------------------------------------- Cervix Uterus Adnexa  Cervix  Not visualized (advanced GA >24wks)  Uterus  No abnormality visualized.  Right Ovary  Within normal limits.  Left Ovary  Within normal limits.  Cul De Sac  No free fluid seen.  Adnexa  No abnormality visualized. ---------------------------------------------------------------------- Impression  Follow up growth  due to subutex exposure and elevated BMI  Normal interval growth with measurements consistent with  dates  Good fetal movement and amniotic fluid volume  I discussed today's visit with Ms. Kaestner and recommended a  repeat growth in 4 weeks, and to initiate weekly testing at 36  weeks. ----------------------------------------------------------------------               Lin Landsman, MD Electronically Signed Final Report   07/25/2020 11:29 am ----------------------------------------------------------------------   MAU Course/MDM: Orders Placed  This Encounter  Procedures  . Wet prep, genital  . OB Urine Culture  . Resp Panel by RT-PCR (Flu A&B, Covid) Nasopharyngeal Swab  . DG Abd 1 View  . Urinalysis, Routine w reflex microscopic Urine, Clean Catch  . CBC  . Comprehensive metabolic panel  . Protein / creatinine ratio, urine  . Urine rapid drug screen (hosp performed)  . Airborne and Contact precautions  . Insert peripheral IV  . Discharge patient Discharge disposition: 01-Home or Self Care; Discharge patient date: 08/08/2020    Meds ordered this encounter  Medications  . ondansetron (ZOFRAN-ODT) disintegrating tablet 8 mg  . DISCONTD: acetaminophen (TYLENOL) tablet 1,000 mg  . butalbital-acetaminophen-caffeine (FIORICET) 50-325-40 MG per tablet 1 tablet  . acetaminophen (TYLENOL) tablet 650 mg  . lactated ringers bolus 1,000 mL  . alum & mag hydroxide-simeth (MAALOX/MYLANTA) 200-200-20 MG/5ML suspension 30 mL  . promethazine (PHENERGAN) 12.5 mg in sodium chloride 0.9 % 50 mL IVPB  . ondansetron (ZOFRAN ODT) 8 MG disintegrating tablet    Sig: Take 1 tablet (8 mg total) by mouth every 8 (eight) hours as needed for nausea or vomiting.    Dispense:  20 tablet    Refill:  0  . famotidine (PEPCID) 20 MG tablet    Sig: Take 1 tablet (20 mg total) by mouth 2 (two) times daily.    Dispense:  60 tablet    Refill:  1  . polyethylene glycol (MIRALAX) 17 g packet    Sig: Take 17 g by mouth daily.    Dispense:  14 each    Refill:  0    NST reviewed and reactive. Discussed pt with Dr. Connye Burkitt (On-call Attending) regarding presentation, exam findings and test results.  Treatments in MAU included: ODT zofran, fioricet, tylenol, IVF bolus, GI cocktail. Pt discharge with strict return precautions for preterm labor, preeclampsia.  Assessment & Plan: Tylesha Gibeault is a 35 y.o. G4P1021 at [redacted]w[redacted]d who presents to maternity admissions reporting several days of sudden onset severe abdominal pain and headache. No known triggers or sick  contacts. Pt reports inability to tolerate po intake for 2 days. No associated urinary symptoms. Reassuringly, pt afebrile with normal vital signs and normal preeclampsia labs. Reassuring abdominal exam but likely chronic constipation given minimal bowel sounds and chronic suboxone. UDS+ for THC. Symptoms improved s/p treatment in MAU as noted above. 1. Acute nonintractable headache, unspecified headache type: Pt with h/o preeclampsia in prior pregnancy. Reassuringly, headache improved with fioricet. Preeclampsia labs within normal limits and all normal blood pressures in MAU.  - recommended good hydration and prn tylenol - provided strict return precautions for preeclampsia symptoms  2. Generalized abdominal pain  Chronic Constipation: Improved with tylenol, antiemetics, GI cocktail and IVF. Unclear etiology but no concerning signs on CBC or CMP. Possible that chronic constipation is contributing. - recommended good hydration and prn tylenol - provided miralax daily for constipation  3. Nausea and vomiting in pregnancy: - prescribed ODT zofran and pepcid 20mg  BID -  tolerating po fluids prior to discharge   Discharge home with strict return precautions for preeclampsia, preterm labor or persistent/worsening symptoms. Labor precautions and fetal kick counts. Plan to follow-up with [facility] on 08/10/20 as previously scheduled. Provided work note through 08/10/20.  Allergies as of 08/08/2020   No Known Allergies     Medication List    TAKE these medications   buprenorphine 8 MG Subl SL tablet Commonly known as: SUBUTEX Place 8 mg under the tongue 2 (two) times daily.   Doxylamine-Pyridoxine 10-10 MG Tbec Take by mouth.   famotidine 20 MG tablet Commonly known as: PEPCID Take 1 tablet (20 mg total) by mouth 2 (two) times daily.   ondansetron 8 MG disintegrating tablet Commonly known as: Zofran ODT Take 1 tablet (8 mg total) by mouth every 8 (eight) hours as needed for nausea or vomiting.    polyethylene glycol 17 g packet Commonly known as: MiraLax Take 17 g by mouth daily.   prenatal multivitamin Tabs tablet Take 1 tablet by mouth daily at 12 noon.   valACYclovir 500 MG tablet Commonly known as: VALTREX Take 500 mg by mouth daily.     Sheila Oats, MD OB Fellow, Faculty [facility] 08/08/2020 9:17 PM

## 2020-08-08 NOTE — MAU Note (Signed)
Abigail Fox is a 35 y.o. at [redacted]w[redacted]d here in MAU reporting: stomach pain and headache for the past 2 days. Both pains are intermittent. No bleeding or LOF. DFM. Also reporting nausea and vomiting for 2 days. States she has thrown up a lot.   Onset of complaint: ongoing  Pain score: abdominal pain 7/10, headache 5/10  Vitals:   08/08/20 1537  BP: 132/84  Pulse: 93  Resp: 20  Temp: (!) 97.4 F (36.3 C)  SpO2: 99%     FHT:143  Lab orders placed from triage: UA

## 2020-08-08 NOTE — Discharge Instructions (Signed)
-  Take tylenol as needed for headache and stay well hydrated. -Take zofran every 8 hours as needed for nausea and vomiting. -Take miralax daily to help with chronic constipation. -Please follow-up with your prenatal provider as previously scheduled. -Please call sooner if concern of severe headache, vision changes, or elevated blood pressures.

## 2020-08-09 LAB — CULTURE, OB URINE: Culture: NO GROWTH

## 2020-08-09 LAB — GC/CHLAMYDIA PROBE AMP (~~LOC~~) NOT AT ARMC
Chlamydia: NEGATIVE
Comment: NEGATIVE
Comment: NORMAL
Neisseria Gonorrhea: NEGATIVE

## 2020-08-28 ENCOUNTER — Other Ambulatory Visit: Payer: Self-pay

## 2020-08-28 ENCOUNTER — Ambulatory Visit: Payer: BC Managed Care – PPO | Admitting: *Deleted

## 2020-08-28 ENCOUNTER — Encounter: Payer: Self-pay | Admitting: *Deleted

## 2020-08-28 ENCOUNTER — Ambulatory Visit: Payer: BC Managed Care – PPO | Attending: Maternal & Fetal Medicine

## 2020-08-28 DIAGNOSIS — O99323 Drug use complicating pregnancy, third trimester: Secondary | ICD-10-CM | POA: Diagnosis not present

## 2020-08-28 DIAGNOSIS — O2343 Unspecified infection of urinary tract in pregnancy, third trimester: Secondary | ICD-10-CM

## 2020-08-28 DIAGNOSIS — O09293 Supervision of pregnancy with other poor reproductive or obstetric history, third trimester: Secondary | ICD-10-CM

## 2020-08-28 DIAGNOSIS — F1120 Opioid dependence, uncomplicated: Secondary | ICD-10-CM | POA: Diagnosis not present

## 2020-08-28 DIAGNOSIS — Z6841 Body Mass Index (BMI) 40.0 and over, adult: Secondary | ICD-10-CM | POA: Diagnosis not present

## 2020-08-28 DIAGNOSIS — O99213 Obesity complicating pregnancy, third trimester: Secondary | ICD-10-CM

## 2020-08-28 DIAGNOSIS — B1920 Unspecified viral hepatitis C without hepatic coma: Secondary | ICD-10-CM

## 2020-08-28 DIAGNOSIS — Z3A32 32 weeks gestation of pregnancy: Secondary | ICD-10-CM

## 2020-08-28 DIAGNOSIS — B951 Streptococcus, group B, as the cause of diseases classified elsewhere: Secondary | ICD-10-CM

## 2020-08-28 DIAGNOSIS — R7401 Elevation of levels of liver transaminase levels: Secondary | ICD-10-CM

## 2020-08-28 DIAGNOSIS — O26613 Liver and biliary tract disorders in pregnancy, third trimester: Secondary | ICD-10-CM

## 2020-08-28 DIAGNOSIS — O98413 Viral hepatitis complicating pregnancy, third trimester: Secondary | ICD-10-CM

## 2020-08-28 DIAGNOSIS — O34219 Maternal care for unspecified type scar from previous cesarean delivery: Secondary | ICD-10-CM

## 2020-08-28 DIAGNOSIS — Z362 Encounter for other antenatal screening follow-up: Secondary | ICD-10-CM

## 2020-08-28 DIAGNOSIS — E669 Obesity, unspecified: Secondary | ICD-10-CM

## 2020-08-28 DIAGNOSIS — F1910 Other psychoactive substance abuse, uncomplicated: Secondary | ICD-10-CM

## 2020-08-29 ENCOUNTER — Other Ambulatory Visit: Payer: Self-pay | Admitting: *Deleted

## 2020-08-29 DIAGNOSIS — Z6841 Body Mass Index (BMI) 40.0 and over, adult: Secondary | ICD-10-CM

## 2020-09-18 ENCOUNTER — Other Ambulatory Visit: Payer: Self-pay

## 2020-09-18 ENCOUNTER — Ambulatory Visit: Payer: BC Managed Care – PPO | Attending: Obstetrics

## 2020-09-18 ENCOUNTER — Encounter: Payer: Self-pay | Admitting: *Deleted

## 2020-09-18 ENCOUNTER — Ambulatory Visit: Payer: BC Managed Care – PPO | Admitting: *Deleted

## 2020-09-18 VITALS — BP 111/72 | HR 89

## 2020-09-18 DIAGNOSIS — Z6841 Body Mass Index (BMI) 40.0 and over, adult: Secondary | ICD-10-CM | POA: Diagnosis not present

## 2020-09-18 DIAGNOSIS — O2343 Unspecified infection of urinary tract in pregnancy, third trimester: Secondary | ICD-10-CM

## 2020-09-18 DIAGNOSIS — O99213 Obesity complicating pregnancy, third trimester: Secondary | ICD-10-CM

## 2020-09-18 DIAGNOSIS — B182 Chronic viral hepatitis C: Secondary | ICD-10-CM

## 2020-09-18 DIAGNOSIS — O34219 Maternal care for unspecified type scar from previous cesarean delivery: Secondary | ICD-10-CM

## 2020-09-18 DIAGNOSIS — O98413 Viral hepatitis complicating pregnancy, third trimester: Secondary | ICD-10-CM

## 2020-09-18 DIAGNOSIS — O99323 Drug use complicating pregnancy, third trimester: Secondary | ICD-10-CM | POA: Diagnosis not present

## 2020-09-18 DIAGNOSIS — F1910 Other psychoactive substance abuse, uncomplicated: Secondary | ICD-10-CM

## 2020-09-18 DIAGNOSIS — O09293 Supervision of pregnancy with other poor reproductive or obstetric history, third trimester: Secondary | ICD-10-CM

## 2020-09-18 DIAGNOSIS — Z3A35 35 weeks gestation of pregnancy: Secondary | ICD-10-CM

## 2020-09-18 DIAGNOSIS — B951 Streptococcus, group B, as the cause of diseases classified elsewhere: Secondary | ICD-10-CM | POA: Diagnosis not present

## 2020-09-21 DIAGNOSIS — Z3483 Encounter for supervision of other normal pregnancy, third trimester: Secondary | ICD-10-CM | POA: Diagnosis not present

## 2020-09-21 DIAGNOSIS — Z3490 Encounter for supervision of normal pregnancy, unspecified, unspecified trimester: Secondary | ICD-10-CM | POA: Diagnosis not present

## 2020-09-25 ENCOUNTER — Other Ambulatory Visit: Payer: Self-pay

## 2020-09-25 ENCOUNTER — Ambulatory Visit: Payer: BC Managed Care – PPO | Admitting: *Deleted

## 2020-09-25 ENCOUNTER — Ambulatory Visit: Payer: BC Managed Care – PPO | Attending: Obstetrics

## 2020-09-25 ENCOUNTER — Encounter: Payer: Self-pay | Admitting: *Deleted

## 2020-09-25 VITALS — BP 116/72 | HR 74

## 2020-09-25 DIAGNOSIS — O34219 Maternal care for unspecified type scar from previous cesarean delivery: Secondary | ICD-10-CM | POA: Diagnosis not present

## 2020-09-25 DIAGNOSIS — O99213 Obesity complicating pregnancy, third trimester: Secondary | ICD-10-CM

## 2020-09-25 DIAGNOSIS — F1910 Other psychoactive substance abuse, uncomplicated: Secondary | ICD-10-CM

## 2020-09-25 DIAGNOSIS — Z3A36 36 weeks gestation of pregnancy: Secondary | ICD-10-CM | POA: Diagnosis not present

## 2020-09-25 DIAGNOSIS — B951 Streptococcus, group B, as the cause of diseases classified elsewhere: Secondary | ICD-10-CM

## 2020-09-25 DIAGNOSIS — O09293 Supervision of pregnancy with other poor reproductive or obstetric history, third trimester: Secondary | ICD-10-CM

## 2020-09-25 DIAGNOSIS — B182 Chronic viral hepatitis C: Secondary | ICD-10-CM

## 2020-09-25 DIAGNOSIS — Z6841 Body Mass Index (BMI) 40.0 and over, adult: Secondary | ICD-10-CM

## 2020-09-25 DIAGNOSIS — O99323 Drug use complicating pregnancy, third trimester: Secondary | ICD-10-CM | POA: Diagnosis not present

## 2020-09-25 DIAGNOSIS — O98413 Viral hepatitis complicating pregnancy, third trimester: Secondary | ICD-10-CM

## 2020-09-25 DIAGNOSIS — F1120 Opioid dependence, uncomplicated: Secondary | ICD-10-CM | POA: Diagnosis not present

## 2020-09-26 NOTE — H&P (Signed)
HPI: 35 y.o. [id number] @ [redacted]w[redacted]d estimated gestational age (as dated by 8 week ultrasound) presents for repeat cesarean section and bilateral salpingectomy. She is 100% positive that she does not desire future fertility.   Leakage of fluid:  No Vaginal bleeding:  No Contractions:  No Fetal movement:  Yes  Prenatal care has been provided by Dr. Steva Ready [facility])  ROS:  Denies fevers, chills, chest pain, visual changes, SOB, RUQ/epigastric pain, N/V, dysuria, hematuria, or sudden onset/worsening bilateral LE or facial edema.  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) Desires permanent sterilization  Prenatal Transfer Tool  Maternal Diabetes: No Genetic Screening: Declined Maternal Ultrasounds/Referrals: IUGR Fetal Ultrasounds or other Referrals:  Referred to Materal Fetal Medicine  Maternal Substance Abuse:  No Significant Maternal Medications:  Meds include: Other: Buprenorphine 8mg  BID Significant Maternal Lab Results: Group B Strep positive   Prenatal Labs Blood type:  O Positive Antibody screen:  Negative CBC:  H/H 11.2/32.3 Rubella: Immune RPR:  Non-reactive Hep B:  Negative Hep C:  Negative HIV:  Negative GC/CT:  Negative Glucola:  135.9 (abnormal), 3h GTT wnl  Immunizations: Tdap: Given prenatally Flu: Received prenatally  OBHx:  OB History     Gravida  33   Para  1   Term  1   Preterm  0   AB  2   Living  1      SAB  1   IAB      Ectopic      Multiple  0   Live Births  1          PMHx:  See above Meds:  PNV, Buprenorphine 8mg  BID Allergy:  No Known Allergies SurgHx: Cesarean section x 1,  Lap Cholescystectomy, D&C, Tooth extraction SocHx:   Denies Tobacco, ETOH, illicit drugs  O: There were no vitals taken for this visit. Gen. AAOx3, NAD CV.  RRR  Resp. CTAB, no wheezes/rales/rhonchi Abd. Gravid, soft, non-tender throughout, no rebound/guarding Extr.  No bilateral LE edema, no calf tenderness bilaterally  Last (10/02/20):  BPP 8/8, fetus cephalic, anterior placenta  Last growth (09/25/20): EFW 3304g (56%), AAFV, cephalic, anterior placenta  Labs: see orders  A/P:  35 y.o. [place] @ [redacted]w[redacted]d who is admitted for repeat CS and bilateral salpingectomy.  Repeat CS and bilateral salpingectomy - Admit to L&D - Admit labs (CBC, T&S, COVID screen) - CEFM/Toco - Diet:  NPO - IVF:  Per routine - VTE Prophylaxis:  SCDs - GBS Status:  Positive - Presentation:  Cephalic by previous 34 - Antibiotics:  Ancef 3g on call to OR - Shohl's on call to OR - Consented previously for CS, BS, and blood products if indicated  History of CS x 1 (desires repeat) - Performed in 2019 due to NRFHT by Dr. [redacted]w[redacted]d - Desires repeat CS  History of opioid use  - Medication: Buprenorphine 8mg  BID - Provider: Dr. [place]  Previous concern for early onset IUGR - Serial growth 2020 by MFM normal - Declined genetic screening  Large placental lake - Large placental lake with venous blood flow noted in anterior placenta - No signs of placenta previa by MFM  Obesity (BMI 43) - Encourage early ambulation postpartum -  Will likely place on prophylactic Lovenox postpartum while inpatient  History of Hepatitis C Antibody Positive - S/p evalution by GI, no treatment indicated  Anxiety/depression - Mood stable, no current medications  History of postpartum preeclampsia - Has been on ASA 81mg  this pregnancy for preeclampsia prophylaxis  Abnormal Hemoglobniopathy screen  - Reports FOB negative  Hx oral HSV  - Medications: Valtrex 1g daily suppression  Desires permanent  sterilization - Previously counseled on BTL vs prophylactic BS, patient opts for bilateral salpingectomy  Consents: I have explained to the patient that this surgery is performed to deliver their baby or babies through an incision in the abdomen and incision in the uterus.  Prior to surgery, the risks and benefits of the surgery, as well as alternative treatments were discussed.  The risks include, but are not limited to, possible need for cesarean delivery for all future pregnancies, bleeding at the time of surgery that could necessitate a blood transfusion and/or hysterectomy, rupture of the uterus during a future pregnancy that could cause a preterm delivery and/or requiring hysterectomy, infection, damage to surrounding organs and tissues, damage to bladder, damage to ureters, causing kidney damage, and requiring additional procedures, damage to bowels, resulting in further surgery, postoperative pain, short-term and long-term, scarring on the abdominal wall and intra-abdominally, need for further surgery, development of an incisional hernia, deep vein thrombosis and/or pulmonary embolism, wound infection and/or separation, painful intercourse, urinary leakage, impact on future pregnancies including but not limited to, abnormal location or attachment of the placenta to the uterus, such as placenta previa or accreta, that may necessitate a blood transfusion and/or hysterectomy, impact on total family size, complications the course of which cannot be predicted or prevented, and death. Patient was consented for prophylactic bilateral salpingectomy as method of permanent sterilization.  She understands that this is a permanent procedure and is not intended to be reversed.  She was counseled on decreased future risk of ovarian cancer but understands that the risk of ovarian cancer is not entirely eliminated.  She understands that there is an increased risk of bleeding and increased surgical time as compared to  bilateral tubal ligation. Patient was consented for blood products.  The patient is aware that bleeding may result in the need for a blood transfusion which includes risk of transmission of HIV (1:2 million), Hepatitis C (1:2 million), and Hepatitis B (1:200 thousand) and transfusion reaction.  Patient voiced understanding of the above risks as well as understanding of indications for blood transfusion.  , DO [phone] (office)

## 2020-10-02 ENCOUNTER — Other Ambulatory Visit: Payer: Self-pay

## 2020-10-02 ENCOUNTER — Encounter: Payer: Self-pay | Admitting: *Deleted

## 2020-10-02 ENCOUNTER — Ambulatory Visit: Payer: BC Managed Care – PPO | Admitting: *Deleted

## 2020-10-02 ENCOUNTER — Ambulatory Visit: Payer: BC Managed Care – PPO | Attending: Obstetrics

## 2020-10-02 VITALS — BP 131/85 | HR 85

## 2020-10-02 DIAGNOSIS — Z3A37 37 weeks gestation of pregnancy: Secondary | ICD-10-CM | POA: Diagnosis not present

## 2020-10-02 DIAGNOSIS — O99213 Obesity complicating pregnancy, third trimester: Secondary | ICD-10-CM | POA: Diagnosis not present

## 2020-10-02 DIAGNOSIS — O2343 Unspecified infection of urinary tract in pregnancy, third trimester: Secondary | ICD-10-CM

## 2020-10-02 DIAGNOSIS — O09293 Supervision of pregnancy with other poor reproductive or obstetric history, third trimester: Secondary | ICD-10-CM | POA: Insufficient documentation

## 2020-10-02 DIAGNOSIS — Z6841 Body Mass Index (BMI) 40.0 and over, adult: Secondary | ICD-10-CM

## 2020-10-02 DIAGNOSIS — O98413 Viral hepatitis complicating pregnancy, third trimester: Secondary | ICD-10-CM

## 2020-10-02 DIAGNOSIS — F1910 Other psychoactive substance abuse, uncomplicated: Secondary | ICD-10-CM

## 2020-10-02 DIAGNOSIS — O99323 Drug use complicating pregnancy, third trimester: Secondary | ICD-10-CM | POA: Diagnosis not present

## 2020-10-02 DIAGNOSIS — O34219 Maternal care for unspecified type scar from previous cesarean delivery: Secondary | ICD-10-CM | POA: Insufficient documentation

## 2020-10-02 DIAGNOSIS — B182 Chronic viral hepatitis C: Secondary | ICD-10-CM

## 2020-10-03 NOTE — Patient Instructions (Signed)
Abigail Fox  10/03/2020   Your procedure is scheduled on:  10/11/2020  Arrive at 1030 at Entrance C on [address] at [facility]  and [employer]. You are invited to use the FREE valet parking or use the Visitor's parking deck.  Pick up the phone at the desk and dial [phone].  Call this number if you have problems the morning of surgery: [phone]  Remember:   Do not eat food:(After Midnight) Desps de medianoche.  Do not drink clear liquids: (After Midnight) Desps de medianoche.  Take these medicines the morning of surgery with A SIP OF WATER:  Take subutex and valtrex as prescribed   Do not wear jewelry, make-up or nail polish.  Do not wear lotions, powders, or perfumes. Do not wear deodorant.  Do not shave 48 hours prior to surgery.  Do not bring valuables to the hospital.  [facility] is not   responsible for any belongings or valuables brought to the hospital.  Contacts, dentures or bridgework may not be worn into surgery.  Leave suitcase in the car. After surgery it may be brought to your room.  For patients admitted to the hospital, checkout time is 11:00 AM the day of              discharge.      Please read over the following fact sheets that you were given:     Preparing for Surgery

## 2020-10-05 ENCOUNTER — Telehealth (HOSPITAL_COMMUNITY): Payer: Self-pay | Admitting: *Deleted

## 2020-10-05 NOTE — Telephone Encounter (Signed)
Preadmission screen  

## 2020-10-06 ENCOUNTER — Encounter (HOSPITAL_COMMUNITY): Payer: Self-pay

## 2020-10-06 ENCOUNTER — Telehealth (HOSPITAL_COMMUNITY): Payer: Self-pay | Admitting: *Deleted

## 2020-10-06 NOTE — Telephone Encounter (Signed)
Preadmission screen  

## 2020-10-10 ENCOUNTER — Other Ambulatory Visit: Payer: Self-pay

## 2020-10-10 ENCOUNTER — Encounter (HOSPITAL_COMMUNITY): Payer: Self-pay | Admitting: Obstetrics and Gynecology

## 2020-10-10 ENCOUNTER — Other Ambulatory Visit (HOSPITAL_COMMUNITY)
Admission: RE | Admit: 2020-10-10 | Discharge: 2020-10-10 | Disposition: A | Discharge status: Home / Self Care | Payer: BC Managed Care – PPO | Source: Ambulatory Visit | Attending: Obstetrics and Gynecology | Admitting: Obstetrics and Gynecology

## 2020-10-10 ENCOUNTER — Encounter (HOSPITAL_COMMUNITY)
Admission: RE | Admit: 2020-10-10 | Discharge: 2020-10-10 | Disposition: A | Discharge status: Home / Self Care | Payer: BC Managed Care – PPO | Source: Ambulatory Visit | Attending: Obstetrics and Gynecology | Admitting: Obstetrics and Gynecology

## 2020-10-10 DIAGNOSIS — O9902 Anemia complicating childbirth: Secondary | ICD-10-CM | POA: Diagnosis not present

## 2020-10-10 DIAGNOSIS — Z3A39 39 weeks gestation of pregnancy: Secondary | ICD-10-CM | POA: Diagnosis not present

## 2020-10-10 DIAGNOSIS — D649 Anemia, unspecified: Secondary | ICD-10-CM | POA: Diagnosis not present

## 2020-10-10 DIAGNOSIS — O9832 Other infections with a predominantly sexual mode of transmission complicating childbirth: Secondary | ICD-10-CM | POA: Diagnosis not present

## 2020-10-10 DIAGNOSIS — O99892 Other specified diseases and conditions complicating childbirth: Secondary | ICD-10-CM | POA: Diagnosis not present

## 2020-10-10 DIAGNOSIS — Z98891 History of uterine scar from previous surgery: Secondary | ICD-10-CM | POA: Diagnosis not present

## 2020-10-10 DIAGNOSIS — O99214 Obesity complicating childbirth: Secondary | ICD-10-CM | POA: Diagnosis not present

## 2020-10-10 DIAGNOSIS — O34211 Maternal care for low transverse scar from previous cesarean delivery: Secondary | ICD-10-CM | POA: Diagnosis not present

## 2020-10-10 DIAGNOSIS — A6000 Herpesviral infection of urogenital system, unspecified: Secondary | ICD-10-CM | POA: Diagnosis not present

## 2020-10-10 DIAGNOSIS — Z3A00 Weeks of gestation of pregnancy not specified: Secondary | ICD-10-CM | POA: Diagnosis not present

## 2020-10-10 DIAGNOSIS — Z302 Encounter for sterilization: Secondary | ICD-10-CM | POA: Diagnosis not present

## 2020-10-10 DIAGNOSIS — O34219 Maternal care for unspecified type scar from previous cesarean delivery: Secondary | ICD-10-CM | POA: Diagnosis not present

## 2020-10-10 DIAGNOSIS — R059 Cough, unspecified: Secondary | ICD-10-CM | POA: Diagnosis not present

## 2020-10-10 DIAGNOSIS — O99824 Streptococcus B carrier state complicating childbirth: Secondary | ICD-10-CM | POA: Diagnosis not present

## 2020-10-10 DIAGNOSIS — Z01812 Encounter for preprocedural laboratory examination: Secondary | ICD-10-CM | POA: Insufficient documentation

## 2020-10-10 DIAGNOSIS — Z20822 Contact with and (suspected) exposure to covid-19: Secondary | ICD-10-CM | POA: Diagnosis not present

## 2020-10-10 LAB — CBC
HCT: 34.3 % — ABNORMAL LOW (ref 36.0–46.0)
Hemoglobin: 11.8 g/dL — ABNORMAL LOW (ref 12.0–15.0)
MCH: 31.1 pg (ref 26.0–34.0)
MCHC: 34.4 g/dL (ref 30.0–36.0)
MCV: 90.5 fL (ref 80.0–100.0)
Platelets: 147 10*3/uL — ABNORMAL LOW (ref 150–400)
RBC: 3.79 MIL/uL — ABNORMAL LOW (ref 3.87–5.11)
RDW: 13.7 % (ref 11.5–15.5)
WBC: 11.3 10*3/uL — ABNORMAL HIGH (ref 4.0–10.5)
nRBC: 0 % (ref 0.0–0.2)

## 2020-10-10 LAB — SARS CORONAVIRUS 2 (TAT 6-24 HRS): SARS Coronavirus 2: NEGATIVE

## 2020-10-10 LAB — TYPE AND SCREEN
ABO/RH(D): O POS
Antibody Screen: NEGATIVE

## 2020-10-11 ENCOUNTER — Encounter (HOSPITAL_COMMUNITY): Payer: Self-pay | Admitting: Obstetrics and Gynecology

## 2020-10-11 ENCOUNTER — Inpatient Hospital Stay (HOSPITAL_COMMUNITY): Payer: BC Managed Care – PPO | Admitting: Anesthesiology

## 2020-10-11 ENCOUNTER — Other Ambulatory Visit: Payer: Self-pay

## 2020-10-11 ENCOUNTER — Encounter (HOSPITAL_COMMUNITY)
Admission: RE | Disposition: A | Discharge status: Home / Self Care | Payer: Self-pay | Source: Home / Self Care | Attending: Obstetrics and Gynecology

## 2020-10-11 ENCOUNTER — Inpatient Hospital Stay (HOSPITAL_COMMUNITY)
Admission: RE | Admit: 2020-10-11 | Discharge: 2020-10-14 | DRG: 784 | Disposition: A | Discharge status: Home / Self Care | Payer: BC Managed Care – PPO | Attending: Obstetrics and Gynecology | Admitting: Obstetrics and Gynecology

## 2020-10-11 DIAGNOSIS — O9832 Other infections with a predominantly sexual mode of transmission complicating childbirth: Secondary | ICD-10-CM | POA: Diagnosis present

## 2020-10-11 DIAGNOSIS — O99824 Streptococcus B carrier state complicating childbirth: Secondary | ICD-10-CM | POA: Diagnosis present

## 2020-10-11 DIAGNOSIS — O99214 Obesity complicating childbirth: Secondary | ICD-10-CM | POA: Diagnosis present

## 2020-10-11 DIAGNOSIS — Z302 Encounter for sterilization: Secondary | ICD-10-CM

## 2020-10-11 DIAGNOSIS — A6000 Herpesviral infection of urogenital system, unspecified: Secondary | ICD-10-CM | POA: Diagnosis present

## 2020-10-11 DIAGNOSIS — Z98891 History of uterine scar from previous surgery: Secondary | ICD-10-CM

## 2020-10-11 DIAGNOSIS — R059 Cough, unspecified: Secondary | ICD-10-CM | POA: Diagnosis present

## 2020-10-11 DIAGNOSIS — Z20822 Contact with and (suspected) exposure to covid-19: Secondary | ICD-10-CM | POA: Diagnosis present

## 2020-10-11 DIAGNOSIS — Z3A39 39 weeks gestation of pregnancy: Secondary | ICD-10-CM | POA: Diagnosis not present

## 2020-10-11 DIAGNOSIS — O34211 Maternal care for low transverse scar from previous cesarean delivery: Secondary | ICD-10-CM | POA: Diagnosis present

## 2020-10-11 DIAGNOSIS — O99892 Other specified diseases and conditions complicating childbirth: Secondary | ICD-10-CM | POA: Diagnosis present

## 2020-10-11 LAB — CREATININE, SERUM
Creatinine, Ser: 0.7 mg/dL (ref 0.44–1.00)
GFR, Estimated: >60 mL/min (ref >60)

## 2020-10-11 LAB — RPR: RPR Ser Ql: NONREACTIVE

## 2020-10-11 SURGERY — Surgical Case
Anesthesia: Spinal | Site: Abdomen | Laterality: Bilateral | Wound class: Clean Contaminated

## 2020-10-11 MED ORDER — OXYCODONE HCL 5 MG PO TABS: 5.0000 mg | ORAL_TABLET | Freq: Once | ORAL | Status: DC | PRN

## 2020-10-11 MED ORDER — COCONUT OIL OIL
1.0000 | TOPICAL_OIL | Status: DC | PRN
Start: 2020-10-11 — End: 2020-10-14

## 2020-10-11 MED ORDER — CEFAZOLIN SODIUM-DEXTROSE 2-4 GM/100ML-% IV SOLN
INTRAVENOUS | Status: AC
  Filled 2020-10-11: qty 100

## 2020-10-11 MED ORDER — NALOXONE HCL 0.4 MG/ML IJ SOLN: 0.4000 mg | INTRAMUSCULAR | Status: DC | PRN

## 2020-10-11 MED ORDER — MORPHINE SULFATE (PF) 2 MG/ML IV SOLN
1.0000 mg | INTRAVENOUS | Status: DC | PRN
  Administered 2020-10-11: 1 mg via INTRAVENOUS
  Filled 2020-10-11: qty 1

## 2020-10-11 MED ORDER — SCOPOLAMINE 1 MG/3DAYS TD PT72: 1.0000 | MEDICATED_PATCH | Freq: Once | TRANSDERMAL | Status: DC

## 2020-10-11 MED ORDER — LACTATED RINGERS IV SOLN: INTRAVENOUS | Status: DC | PRN

## 2020-10-11 MED ORDER — ACETAMINOPHEN 10 MG/ML IV SOLN
INTRAVENOUS | Status: AC
  Filled 2020-10-11: qty 100

## 2020-10-11 MED ORDER — NALBUPHINE HCL 10 MG/ML IJ SOLN: 5.0000 mg | Freq: Once | INTRAMUSCULAR | Status: DC | PRN

## 2020-10-11 MED ORDER — MORPHINE SULFATE (PF) 0.5 MG/ML IJ SOLN
INTRAMUSCULAR | Status: DC | PRN
  Administered 2020-10-11: .15 mg via INTRATHECAL

## 2020-10-11 MED ORDER — SENNOSIDES-DOCUSATE SODIUM 8.6-50 MG PO TABS
2.0000 | ORAL_TABLET | Freq: Every day | ORAL | Status: DC
  Administered 2020-10-12 – 2020-10-14 (×3): 2 via ORAL
  Filled 2020-10-11 (×3): qty 2

## 2020-10-11 MED ORDER — KETOROLAC TROMETHAMINE 30 MG/ML IJ SOLN
INTRAMUSCULAR | Status: AC
  Filled 2020-10-11: qty 1

## 2020-10-11 MED ORDER — CEFAZOLIN IN SODIUM CHLORIDE 3-0.9 GM/100ML-% IV SOLN
3.0000 g | INTRAVENOUS | Status: AC
  Administered 2020-10-11: 3 g via INTRAVENOUS

## 2020-10-11 MED ORDER — ONDANSETRON HCL 4 MG/2ML IJ SOLN
INTRAMUSCULAR | Status: AC
  Filled 2020-10-11: qty 2

## 2020-10-11 MED ORDER — CHLORHEXIDINE GLUCONATE 0.12 % MT SOLN
15.0000 mL | Freq: Once | OROMUCOSAL | Status: AC
  Administered 2020-10-11: 15 mL via OROMUCOSAL

## 2020-10-11 MED ORDER — FENTANYL CITRATE (PF) 100 MCG/2ML IJ SOLN
INTRAMUSCULAR | Status: AC
  Filled 2020-10-11: qty 2

## 2020-10-11 MED ORDER — DIPHENHYDRAMINE HCL 50 MG/ML IJ SOLN: 12.5000 mg | INTRAMUSCULAR | Status: DC | PRN

## 2020-10-11 MED ORDER — MENTHOL 3 MG MT LOZG
1.0000 | LOZENGE | OROMUCOSAL | Status: DC | PRN
  Administered 2020-10-11 – 2020-10-13 (×2): 3 mg via ORAL
  Filled 2020-10-11 (×3): qty 9

## 2020-10-11 MED ORDER — PROMETHAZINE HCL 25 MG/ML IJ SOLN: 6.2500 mg | INTRAMUSCULAR | Status: DC | PRN

## 2020-10-11 MED ORDER — WITCH HAZEL-GLYCERIN EX PADS: 1.0000 "application " | MEDICATED_PAD | CUTANEOUS | Status: DC | PRN

## 2020-10-11 MED ORDER — PHENYLEPHRINE HCL (PRESSORS) 10 MG/ML IV SOLN
INTRAVENOUS | Status: DC | PRN
  Administered 2020-10-11: 80 ug via INTRAVENOUS

## 2020-10-11 MED ORDER — SODIUM CHLORIDE 0.9 % IR SOLN
Status: DC | PRN
  Administered 2020-10-11: 1000 mL

## 2020-10-11 MED ORDER — DIPHENHYDRAMINE HCL 25 MG PO CAPS: 25.0000 mg | ORAL_CAPSULE | ORAL | Status: DC | PRN

## 2020-10-11 MED ORDER — LACTATED RINGERS IV SOLN: INTRAVENOUS | Status: DC

## 2020-10-11 MED ORDER — KETOROLAC TROMETHAMINE 30 MG/ML IJ SOLN: 30.0000 mg | Freq: Four times a day (QID) | INTRAMUSCULAR | Status: DC | PRN

## 2020-10-11 MED ORDER — PHENYLEPHRINE HCL-NACL 20-0.9 MG/250ML-% IV SOLN
INTRAVENOUS | Status: DC | PRN
  Administered 2020-10-11: 60 ug/min via INTRAVENOUS

## 2020-10-11 MED ORDER — ONDANSETRON HCL 4 MG/2ML IJ SOLN
INTRAMUSCULAR | Status: DC | PRN
  Administered 2020-10-11: 4 mg via INTRAVENOUS

## 2020-10-11 MED ORDER — SIMETHICONE 80 MG PO CHEW
80.0000 mg | CHEWABLE_TABLET | Freq: Three times a day (TID) | ORAL | Status: DC
  Administered 2020-10-12 – 2020-10-14 (×8): 80 mg via ORAL
  Filled 2020-10-11 (×9): qty 1

## 2020-10-11 MED ORDER — SODIUM CHLORIDE 0.9% FLUSH: 3.0000 mL | INTRAVENOUS | Status: DC | PRN

## 2020-10-11 MED ORDER — PRENATAL MULTIVITAMIN CH
1.0000 | ORAL_TABLET | Freq: Every day | ORAL | Status: DC
  Administered 2020-10-12 – 2020-10-14 (×3): 1 via ORAL
  Filled 2020-10-11 (×3): qty 1

## 2020-10-11 MED ORDER — OXYCODONE HCL 5 MG PO TABS
5.0000 mg | ORAL_TABLET | ORAL | Status: DC | PRN
Start: 2020-10-11 — End: 2020-10-14
  Administered 2020-10-12 – 2020-10-13 (×9): 10 mg via ORAL
  Administered 2020-10-14: 5 mg via ORAL
  Administered 2020-10-14 (×2): 10 mg via ORAL
  Administered 2020-10-14: 5 mg via ORAL
  Administered 2020-10-14: 10 mg via ORAL
  Filled 2020-10-11 (×13): qty 2
  Filled 2020-10-11: qty 1

## 2020-10-11 MED ORDER — ACETAMINOPHEN 500 MG PO TABS
1000.0000 mg | ORAL_TABLET | Freq: Four times a day (QID) | ORAL | Status: DC | PRN
  Administered 2020-10-11 – 2020-10-14 (×9): 1000 mg via ORAL
  Filled 2020-10-11 (×9): qty 2

## 2020-10-11 MED ORDER — NALBUPHINE HCL 10 MG/ML IJ SOLN: 5.0000 mg | INTRAMUSCULAR | Status: DC | PRN

## 2020-10-11 MED ORDER — DIPHENHYDRAMINE HCL 25 MG PO CAPS: 25.0000 mg | ORAL_CAPSULE | Freq: Four times a day (QID) | ORAL | Status: DC | PRN

## 2020-10-11 MED ORDER — BUPRENORPHINE HCL 8 MG SL SUBL
8.0000 mg | SUBLINGUAL_TABLET | Freq: Two times a day (BID) | SUBLINGUAL | Status: DC
  Administered 2020-10-11 – 2020-10-14 (×6): 8 mg via SUBLINGUAL
  Filled 2020-10-11 (×6): qty 1

## 2020-10-11 MED ORDER — OXYTOCIN-SODIUM CHLORIDE 30-0.9 UT/500ML-% IV SOLN
INTRAVENOUS | Status: DC | PRN
  Administered 2020-10-11: 500 mL via INTRAVENOUS

## 2020-10-11 MED ORDER — SOD CITRATE-CITRIC ACID 500-334 MG/5ML PO SOLN
30.0000 mL | ORAL | Status: AC
  Administered 2020-10-11: 30 mL via ORAL

## 2020-10-11 MED ORDER — ZOLPIDEM TARTRATE 5 MG PO TABS
5.0000 mg | ORAL_TABLET | Freq: Every evening | ORAL | Status: DC | PRN
Start: 2020-10-11 — End: 2020-10-14

## 2020-10-11 MED ORDER — CEFAZOLIN IN SODIUM CHLORIDE 3-0.9 GM/100ML-% IV SOLN
INTRAVENOUS | Status: AC
  Filled 2020-10-11: qty 100

## 2020-10-11 MED ORDER — DIBUCAINE (PERIANAL) 1 % EX OINT: 1.0000 "application " | TOPICAL_OINTMENT | CUTANEOUS | Status: DC | PRN

## 2020-10-11 MED ORDER — MORPHINE SULFATE (PF) 0.5 MG/ML IJ SOLN
INTRAMUSCULAR | Status: AC
  Filled 2020-10-11: qty 10

## 2020-10-11 MED ORDER — STERILE WATER FOR IRRIGATION IR SOLN
Status: DC | PRN
  Administered 2020-10-11: 1000 mL

## 2020-10-11 MED ORDER — MEPERIDINE HCL 25 MG/ML IJ SOLN: 6.2500 mg | INTRAMUSCULAR | Status: DC | PRN

## 2020-10-11 MED ORDER — FAMOTIDINE 20 MG PO TABS
20.0000 mg | ORAL_TABLET | Freq: Once | ORAL | Status: AC
  Administered 2020-10-11: 20 mg via ORAL

## 2020-10-11 MED ORDER — FENTANYL CITRATE (PF) 100 MCG/2ML IJ SOLN: 25.0000 ug | INTRAMUSCULAR | Status: DC | PRN

## 2020-10-11 MED ORDER — SOD CITRATE-CITRIC ACID 500-334 MG/5ML PO SOLN: 30.0000 mL | Freq: Once | ORAL | Status: DC

## 2020-10-11 MED ORDER — ONDANSETRON HCL 4 MG/2ML IJ SOLN: 4.0000 mg | Freq: Three times a day (TID) | INTRAMUSCULAR | Status: DC | PRN

## 2020-10-11 MED ORDER — OXYTOCIN-SODIUM CHLORIDE 30-0.9 UT/500ML-% IV SOLN
INTRAVENOUS | Status: AC
  Filled 2020-10-11: qty 500

## 2020-10-11 MED ORDER — DEXAMETHASONE SODIUM PHOSPHATE 4 MG/ML IJ SOLN
INTRAMUSCULAR | Status: AC
  Filled 2020-10-11: qty 1

## 2020-10-11 MED ORDER — SCOPOLAMINE 1 MG/3DAYS TD PT72
MEDICATED_PATCH | TRANSDERMAL | Status: AC
  Filled 2020-10-11: qty 1

## 2020-10-11 MED ORDER — IBUPROFEN 600 MG PO TABS
600.0000 mg | ORAL_TABLET | Freq: Four times a day (QID) | ORAL | Status: DC
  Administered 2020-10-12 – 2020-10-14 (×8): 600 mg via ORAL
  Filled 2020-10-11 (×8): qty 1

## 2020-10-11 MED ORDER — BUPIVACAINE IN DEXTROSE 0.75-8.25 % IT SOLN
INTRATHECAL | Status: DC | PRN
  Administered 2020-10-11: 1.6 mL via INTRATHECAL

## 2020-10-11 MED ORDER — PHENYLEPHRINE 40 MCG/ML (10ML) SYRINGE FOR IV PUSH (FOR BLOOD PRESSURE SUPPORT)
PREFILLED_SYRINGE | INTRAVENOUS | Status: AC
  Filled 2020-10-11: qty 10

## 2020-10-11 MED ORDER — SIMETHICONE 80 MG PO CHEW: 80.0000 mg | CHEWABLE_TABLET | ORAL | Status: DC | PRN

## 2020-10-11 MED ORDER — NALOXONE HCL 4 MG/10ML IJ SOLN
1.0000 ug/kg/h | INTRAVENOUS | Status: DC | PRN
  Filled 2020-10-11: qty 5

## 2020-10-11 MED ORDER — ENOXAPARIN SODIUM 60 MG/0.6ML IJ SOSY
60.0000 mg | PREFILLED_SYRINGE | INTRAMUSCULAR | Status: DC
  Administered 2020-10-12 – 2020-10-14 (×3): 60 mg via SUBCUTANEOUS
  Filled 2020-10-11 (×3): qty 0.6

## 2020-10-11 MED ORDER — ACETAMINOPHEN 10 MG/ML IV SOLN
INTRAVENOUS | Status: DC | PRN
  Administered 2020-10-11: 1000 mg via INTRAVENOUS

## 2020-10-11 MED ORDER — FAMOTIDINE 20 MG PO TABS
ORAL_TABLET | ORAL | Status: AC
  Filled 2020-10-11: qty 1

## 2020-10-11 MED ORDER — CHLORHEXIDINE GLUCONATE 0.12 % MT SOLN
OROMUCOSAL | Status: AC
  Filled 2020-10-11: qty 15

## 2020-10-11 MED ORDER — ORAL CARE MOUTH RINSE: 15.0000 mL | Freq: Once | OROMUCOSAL | Status: AC

## 2020-10-11 MED ORDER — OXYTOCIN-SODIUM CHLORIDE 30-0.9 UT/500ML-% IV SOLN: 2.5000 [IU]/h | INTRAVENOUS | Status: AC

## 2020-10-11 MED ORDER — KETOROLAC TROMETHAMINE 30 MG/ML IJ SOLN
30.0000 mg | Freq: Four times a day (QID) | INTRAMUSCULAR | Status: AC
  Administered 2020-10-11 – 2020-10-12 (×4): 30 mg via INTRAVENOUS
  Filled 2020-10-11 (×4): qty 1

## 2020-10-11 MED ORDER — OXYCODONE HCL 5 MG/5ML PO SOLN: 5.0000 mg | Freq: Once | ORAL | Status: DC | PRN

## 2020-10-11 MED ORDER — FENTANYL CITRATE (PF) 100 MCG/2ML IJ SOLN
INTRAMUSCULAR | Status: DC | PRN
  Administered 2020-10-11: 15 ug via INTRATHECAL

## 2020-10-11 MED ORDER — SOD CITRATE-CITRIC ACID 500-334 MG/5ML PO SOLN
ORAL | Status: AC
  Filled 2020-10-11: qty 30

## 2020-10-11 MED ORDER — KETOROLAC TROMETHAMINE 30 MG/ML IJ SOLN
30.0000 mg | Freq: Four times a day (QID) | INTRAMUSCULAR | Status: DC | PRN
  Administered 2020-10-11: 30 mg via INTRAVENOUS

## 2020-10-11 MED ORDER — SCOPOLAMINE 1 MG/3DAYS TD PT72
1.0000 | MEDICATED_PATCH | Freq: Once | TRANSDERMAL | Status: DC
  Administered 2020-10-11: 1.5 mg via TRANSDERMAL

## 2020-10-11 SURGICAL SUPPLY — 42 items
APL PRP STRL LF DISP 70% ISPRP (MISCELLANEOUS) ×1
APL SKNCLS STERI-STRIP NONHPOA (GAUZE/BANDAGES/DRESSINGS) ×1
BENZOIN TINCTURE PRP APPL 2/3 (GAUZE/BANDAGES/DRESSINGS) ×2 IMPLANT
CHLORAPREP W/TINT 26 (MISCELLANEOUS) ×2 IMPLANT
CLAMP CORD UMBIL (MISCELLANEOUS) IMPLANT
CLOTH BEACON ORANGE TIMEOUT ST (SAFETY) ×2 IMPLANT
DRAPE C SECTION CLR SCREEN (DRAPES) ×2 IMPLANT
DRESSING PREVENA PLUS CUSTOM (GAUZE/BANDAGES/DRESSINGS) ×2 IMPLANT
DRSG OPSITE POSTOP 4X10 (GAUZE/BANDAGES/DRESSINGS) ×2 IMPLANT
DRSG PREVENA PLUS CUSTOM (GAUZE/BANDAGES/DRESSINGS) ×4
ELECT REM PT RETURN 9FT ADLT (ELECTROSURGICAL) ×2
ELECTRODE REM PT RTRN 9FT ADLT (ELECTROSURGICAL) ×1 IMPLANT
EXTRACTOR VACUUM KIWI (MISCELLANEOUS) IMPLANT
GLOVE BIO SURGEON STRL SZ7 (GLOVE) ×2 IMPLANT
GLOVE BIOGEL PI IND STRL 7.0 (GLOVE) ×2 IMPLANT
GLOVE BIOGEL PI INDICATOR 7.0 (GLOVE) ×2
GLOVE SURG POLYISO LF SZ6.5 (GLOVE) ×2 IMPLANT
GOWN STRL REUS W/ TWL LRG LVL3 (GOWN DISPOSABLE) ×3 IMPLANT
GOWN STRL REUS W/TWL LRG LVL3 (GOWN DISPOSABLE) ×6
KIT ABG SYR 3ML LUER SLIP (SYRINGE) IMPLANT
LIGASURE IMPACT 36 18CM CVD LR (INSTRUMENTS) ×2 IMPLANT
NEEDLE HYPO 25X5/8 SAFETYGLIDE (NEEDLE) IMPLANT
NS IRRIG 1000ML POUR BTL (IV SOLUTION) ×2 IMPLANT
PACK C SECTION WH (CUSTOM PROCEDURE TRAY) ×2 IMPLANT
PAD OB MATERNITY 4.3X12.25 (PERSONAL CARE ITEMS) ×2 IMPLANT
PENCIL SMOKE EVAC W/HOLSTER (ELECTROSURGICAL) ×2 IMPLANT
RETRACTOR TRAXI PANNICULUS (MISCELLANEOUS) ×1 IMPLANT
RTRCTR C-SECT PINK 25CM LRG (MISCELLANEOUS) ×2 IMPLANT
STRIP CLOSURE SKIN 1/2X4 (GAUZE/BANDAGES/DRESSINGS) ×2 IMPLANT
SUT MNCRL 0 VIOLET CTX 36 (SUTURE) ×2 IMPLANT
SUT MNCRL+ AB 3-0 CT1 36 (SUTURE) ×2 IMPLANT
SUT MONOCRYL 0 CTX 36 (SUTURE) ×2
SUT MONOCRYL AB 3-0 CT1 36IN (SUTURE) ×2
SUT PDS AB 0 CTX 36 PDP370T (SUTURE) ×6 IMPLANT
SUT PLAIN 0 NONE (SUTURE) IMPLANT
SUT VIC AB 2-0 CT1 27 (SUTURE)
SUT VIC AB 2-0 CT1 TAPERPNT 27 (SUTURE) IMPLANT
SUT VIC AB 4-0 KS 27 (SUTURE) ×2 IMPLANT
TOWEL OR 17X24 6PK STRL BLUE (TOWEL DISPOSABLE) ×4 IMPLANT
TRAXI PANNICULUS RETRACTOR (MISCELLANEOUS) ×1
TRAY FOLEY W/BAG SLVR 14FR LF (SET/KITS/TRAYS/PACK) ×2 IMPLANT
WATER STERILE IRR 1000ML POUR (IV SOLUTION) ×2 IMPLANT

## 2020-10-11 NOTE — Anesthesia Preprocedure Evaluation (Addendum)
Anesthesia Evaluation  Patient identified by MRN, date of birth, ID band Patient awake    Reviewed: Allergy & Precautions, NPO status , Patient's Chart, lab work & pertinent test results  History of Anesthesia Complications Negative for: history of anesthetic complications  Airway Mallampati: III  TM Distance: >3 FB Neck ROM: Full    Dental  (+) Dental Advisory Given   Pulmonary neg pulmonary ROS,    Pulmonary exam normal        Cardiovascular negative cardio ROS Normal cardiovascular exam     Neuro/Psych PSYCHIATRIC DISORDERS Anxiety negative neurological ROS     GI/Hepatic negative GI ROS, (+)     substance abuse (on subutex)  marijuana use, Hepatitis -, C  Endo/Other  Morbid obesity  Renal/GU negative Renal ROS     Musculoskeletal negative musculoskeletal ROS (+)   Abdominal   Peds  Hematology  (+) anemia ,  Plt 147k    Anesthesia Other Findings Covid test negative   Reproductive/Obstetrics (+) Pregnancy  Postpartum Pre-E with previous pregnancy                             Anesthesia Physical Anesthesia Plan  ASA: 3  Anesthesia Plan: Spinal   Post-op Pain Management:    Induction:   PONV Risk Score and Plan: 2 and Treatment may vary due to age or medical condition, Ondansetron and Scopolamine patch - Pre-op  Airway Management Planned: Natural Airway  Additional Equipment: None  Intra-op Plan:   Post-operative Plan:   Informed Consent: I have reviewed the patients History and Physical, chart, labs and discussed the procedure including the risks, benefits and alternatives for the proposed anesthesia with the patient or authorized representative who has indicated his/her understanding and acceptance.       Plan Discussed with: CRNA and Anesthesiologist  Anesthesia Plan Comments: (Labs reviewed, platelets acceptable. Discussed risks and benefits of spinal,  including spinal/epidural hematoma, infection, failed block, and PDPH. Patient expressed understanding and wished to proceed. )       Anesthesia Quick Evaluation

## 2020-10-11 NOTE — Interval H&P Note (Signed)
History and Physical Interval Note:  10/11/2020 12:22 PM  Abigail Fox  has presented today for surgery, with the diagnosis of History of Cesarean Delivery.  The various methods of treatment have been discussed with the patient and family. After consideration of risks, benefits and other options for treatment, the patient has consented to  Procedure(s): REPEAT CESAREAN SECTION WITH BILATERAL SALPINGECTOMY. (Bilateral) as a surgical intervention.  The patient's history has been reviewed, patient examined, no change in status, stable for surgery.  I have reviewed the patient's chart and labs.  Questions were answered to the patient's satisfaction.     Steva Ready

## 2020-10-11 NOTE — Anesthesia Procedure Notes (Signed)
Spinal  Patient location during procedure: OR Start time: 10/11/2020 12:37 PM End time: 10/11/2020 12:41 PM Reason for block: surgical anesthesia Staffing Performed: anesthesiologist  Anesthesiologist: Beryle Lathe, MD Preanesthetic Checklist Completed: patient identified, IV checked, risks and benefits discussed, surgical consent, monitors and equipment checked, pre-op evaluation and timeout performed Spinal Block Patient position: sitting Prep: DuraPrep Patient monitoring: heart rate, cardiac monitor, continuous pulse ox and blood pressure Approach: midline Location: L3-4 Injection technique: single-shot Needle Needle type: Pencan  Needle gauge: 24 G Additional Notes Consent was obtained prior to the procedure with all questions answered and concerns addressed. Risks including, but not limited to, bleeding, infection, nerve damage, paralysis, failed block, inadequate analgesia, allergic reaction, high spinal, itching, and headache were discussed and the patient wished to proceed. Functioning IV was confirmed and monitors were applied. Sterile prep and drape, including hand hygiene, mask, and sterile gloves were used. The patient was positioned and the spine was prepped. The skin was anesthetized with lidocaine. Free flow of clear CSF was obtained prior to injecting local anesthetic into the CSF. The spinal needle aspirated freely following injection. The needle was carefully withdrawn. The patient tolerated the procedure well.   Leslye Peer, MD

## 2020-10-11 NOTE — Progress Notes (Signed)
Patient rating pain a 8/10 and crying. All scheduled medications have been given. Anesthesia notified and ordered to go ahead and give a dose of morphine per [facility]. Will continue to monitor. Royston Cowper, RN

## 2020-10-11 NOTE — Lactation Note (Signed)
This note was copied from a baby's chart. Lactation Consultation Note  Patient Name: Abigail Fox [id number] Date: 10/11/2020 Reason for consult: Initial assessment;Term Age:33 hours  Initial visit to 4 hours old infant of a P2 mother. Mother states she is in a lot pain but would like assistance to latch. Infant unable to latch after several attempts. LC spoonfed ~4 mL of EBM. Mother requests DEBP to be set up. Reviewed how to use, frequency, care of parts and milk storage. [facility] taught father how to HE.   Plan: 1-Skin to skin 2-Aim for a deep, comfortable latch 3-Breastfeeding on demand or 8-12 times in 24h period. 4-Keep infant awake during breastfeeding session: massaging breast, infant's hand/shoulder/feet 5- Hand express and spoonfed as needed 6-Use manual pump/DEBP as needed 7-Monitor voids and stools as signs good intake.  8-Encouraged maternal rest, hydration and food intake.  9-Contact LC as needed for feeds/support/concerns/questions   All questions answered at this time. Provided Lactation services brochure and promoted [employer] booklet information.    Maternal Data Has patient been taught Hand Expression?: Yes Does the patient have breastfeeding experience prior to this delivery?: Yes How long did the patient breastfeed?: 4 months  Feeding Mother's Current Feeding Choice: Breast Milk  LATCH Score Latch: Too sleepy or reluctant, no latch achieved, no sucking elicited.  Audible Swallowing: None  Type of Nipple: Everted at rest and after stimulation (short shafted, pliable tissue)  Comfort (Breast/Nipple): Soft / non-tender  Hold (Positioning): Assistance needed to correctly position infant at breast and maintain latch.  LATCH Score: 5   Lactation Tools Discussed/Used Tools: Pump;Flanges Flange Size: 24 Breast pump type: Double-Electric Breast Pump Pump Education: Setup, frequency, and cleaning;Milk Storage Reason for Pumping: mother's reques Pumping frequency:  as needed Pumped volume:  (has not started yet)  Interventions Interventions: Breast feeding basics reviewed;Assisted with latch;Skin to skin;Breast massage;Hand express;Adjust position;Breast compression;DEBP;Hand pump;Expressed milk;Position options;Support pillows;Education  Discharge Pump: Personal;DEBP;Manual WIC Program: No  Consult Status Consult Status: Follow-up Date: 10/11/20 Follow-up type: In-patient    Mattix Imhof A Higuera Ancidey 10/11/2020, 5:40 PM

## 2020-10-11 NOTE — Op Note (Signed)
Pre Op Dx:   1. Single live IUP at [redacted]w[redacted]d 2. History of cesarean section, desires repeat 3. Desires permanent sterilization  Post Op Dx:  Same as pre-operative diagnoses  Procedure:  Low Transverse Cesarean Section and Bilateral Salpingectomy  Surgeon:  Dr. Steva Ready Assistants:  Dr. Gerald Leitz Anesthesia:  Spinal   EBL:  387cc  IVF:  1500cc UOP:  150cc  Drains:  Foley catheter  Specimen removed:  Placenta and bilateral fallopian tubes - both sent to pathology Device(s) implanted:  None Case Type:  Clean-contaminated Findings: Normal-appearing uterus, bilateral fallopian tubes, and ovaries. Minimal fascial scarring. No pelvic adhesive disease. Fetus in cephalic position with clear amniotic fluid. Complications: None Indications:  34 y.o. [id number] at [redacted]w[redacted]d with history of cesarean section who desired repeat. She also desired permanent sterilization. Procedure:  After informed consent was obtained, the patient was brought to the operating room.  Following administration of anesthesia, the patient was positioned in dorsal supine position with a leftward tilt and was prepped and draped in sterile fashion.  A preoperative time-out was performed.  The abdomen was entered in layers through a pfannenstiel incision and an Teacher, early years/pre was placed.  A low transverse hysterotomy was created sharply to the level of the membranes, then extended bluntly.  The fetus was delivered from cephalic presentation onto the field.  Bulb suctioning was performed.  The cord was doubly clamped and cut after a 60 second pause.  The newborn was passed to the warmer.  The placenta was delivered.  The uterus was swept free of clots and debris and closed in a running locked fashion with 0-Monocryl.  Hemostasis was verified. The left fallopian tube was traced to its fimbriated end and separated from the normal-appearing left ovary from the mesosalpinx using the Ligasure device in its entirety. Hemostasis verified. This  process was completed on the contralateral side. The abdomen was irrigated with warmed saline and cleared of clots. The peritoneum was closed in a running fashion with 2-0 Vicryl.  Subfascial spaces were inspected and hemostasis assured.  The fascia was closed in a running fashion with 0-PDS.  The subcutaneous tissues were irrigated and hemostasis assured.  The subcutaneous tissues were closed with 3-0 Monocryl.  The skin was closed with 4-0 Vicryl. The wound was dressed with a Prevena wound system. The patient was transferred to PACU.  All needle, sponge, and instrument counts were correct at the end of the case.   Disposition:  PACU  I performed the procedure and the assistant was needed due to the complexity of the anatomy.  Steva Ready, DO

## 2020-10-11 NOTE — Transfer of Care (Signed)
Immediate Anesthesia Transfer of Care Note  Patient: Abigail Fox  Procedure(s) Performed: REPEAT CESAREAN SECTION WITH BILATERAL SALPINGECTOMY. (Bilateral: Abdomen)  Patient Location: PACU  Anesthesia Type:Spinal  Level of Consciousness: awake, alert  and oriented  Airway & Oxygen Therapy: Patient Spontanous Breathing  Post-op Assessment: Report given to RN and Post -op Vital signs reviewed and stable  Post vital signs: Reviewed and stable  Last Vitals:  Vitals Value Taken Time  BP 107/71 10/11/20 1407  Temp    Pulse 83 10/11/20 1409  Resp 16 10/11/20 1409  SpO2 100 % 10/11/20 1409  Vitals shown include unvalidated device data.  Last Pain:  Vitals:   10/11/20 1051  TempSrc: Oral         Complications: No notable events documented.

## 2020-10-12 LAB — CBC
HCT: 30.6 % — ABNORMAL LOW (ref 36.0–46.0)
Hemoglobin: 10.4 g/dL — ABNORMAL LOW (ref 12.0–15.0)
MCH: 31 pg (ref 26.0–34.0)
MCHC: 34 g/dL (ref 30.0–36.0)
MCV: 91.3 fL (ref 80.0–100.0)
Platelets: 148 10*3/uL — ABNORMAL LOW (ref 150–400)
RBC: 3.35 MIL/uL — ABNORMAL LOW (ref 3.87–5.11)
RDW: 13.6 % (ref 11.5–15.5)
WBC: 13.6 10*3/uL — ABNORMAL HIGH (ref 4.0–10.5)
nRBC: 0 % (ref 0.0–0.2)

## 2020-10-12 MED ORDER — GUAIFENESIN 100 MG/5ML PO SOLN
5.0000 mL | ORAL | Status: DC | PRN
  Administered 2020-10-12 – 2020-10-14 (×10): 100 mg via ORAL
  Filled 2020-10-12 (×10): qty 15

## 2020-10-12 NOTE — Progress Notes (Addendum)
CSW will monitor infant's UDS and CDS and will make a report if warranted.   Blaine Hamper, MSW, LCSW Clinical Social Work [phone]

## 2020-10-12 NOTE — Progress Notes (Signed)
Subjective: Postpartum Day 1: Cesarean Delivery Patient reports incisional pain and tolerating PO.   Foley was removed this am pt has the urge to urinate  she is complaining of cough   Objective: Vital signs in last 24 hours: Temp:  [97.4 F (36.3 C)-98.3 F (36.8 C)] 98 F (36.7 C) (07/07 0513) Pulse Rate:  [59-82] 64 (07/07 0513) Resp:  [14-20] 16 (07/07 1200) BP: (103-124)/(60-98) 124/86 (07/07 0513) SpO2:  [95 %-100 %] 99 % (07/07 1200)  Physical Exam:  General: alert, cooperative, and no distress Lochia: appropriate Uterine Fundus: firm Incision: Prevena in place  DVT Evaluation: No evidence of DVT seen on physical exam.  Recent Labs    10/10/20 1017 10/12/20 0513  HGB 11.8* 10.4*  HCT 34.3* 30.6*    Assessment/Plan: Status post Cesarean section. Doing well postoperatively.  Continue current care  Awaiting return of bowel and bladder function  Pt encouraged to ambulate Cough - robitussin ordered.  Pt desires not for husband to be excused from work for the next week. Pt will discuss with Dr. Connye Burkitt tomorrow .  Abigail Fox 10/12/2020, 1:19 PM

## 2020-10-12 NOTE — Anesthesia Postprocedure Evaluation (Signed)
Anesthesia Post Note  Patient: Abigail Fox  Procedure(s) Performed: REPEAT CESAREAN SECTION WITH BILATERAL SALPINGECTOMY. (Bilateral: Abdomen)     Patient location during evaluation: PACU Anesthesia Type: Spinal Level of consciousness: awake and alert Pain management: pain level controlled Vital Signs Assessment: post-procedure vital signs reviewed and stable Respiratory status: spontaneous breathing and respiratory function stable Cardiovascular status: blood pressure returned to baseline and stable Postop Assessment: spinal receding and no apparent nausea or vomiting Anesthetic complications: no   No notable events documented.  Last Vitals:  Vitals:   10/12/20 0513 10/12/20 0800  BP: 124/86   Pulse: 64   Resp: 16 16  Temp: 36.7 C   SpO2:  99%    Last Pain:  Vitals:   10/12/20 0513  TempSrc:   PainSc: 7                  Beryle Lathe

## 2020-10-12 NOTE — Clinical Social Work Maternal (Signed)
CLINICAL SOCIAL WORK MATERNAL/CHILD NOTE  Patient Details  Name: Abigail Fox MRN: [id number] Date of Birth: 06/07/1985  Date:  10/12/2020  Clinical Social Worker Initiating Note:  Laurey Arrow Date/Time: Initiated:  10/12/20/1144     Child's Name:  Andy Gauss   Biological Parents:  Mother, Father   Need for Interpreter:  None   Reason for Referral:  Current Substance Use/Substance Use During Pregnancy  , Behavioral Health Concerns   Address:  [address] [place] [address]    Phone number:  [phone] (home)     Additional phone number: FOB's telephone number is [phone]  Household Members/Support Persons (HM/SP):   Household Member/Support Person 1, Household Member/Support Person 2   HM/SP Name Relationship DOB or Age  HM/SP -1 Brecklyn Galvis FOB/Husband 04/29/70  HM/SP -2 Margareth Kanner daughter 06/08/17  HM/SP -3        HM/SP -4        HM/SP -5        HM/SP -6        HM/SP -7        HM/SP -8          Natural Supports (not living in the home):  Extended Family, Parent, Friends, Immediate Family, Neighbors (MOB and FOB reported that FOB's family will also provide supports if needed.)   Professional Supports: None   Employment: Animator   Type of Work: Environmental manager, MSW   Education:  Production designer, theatre/television/film   Homebound arranged:    Museum/gallery curator Resources:  Multimedia programmer    Other Resources:   (Per MOB, the family does not qualify for benefits.)   Cultural/Religious Considerations Which May Impact Care: None reported  Strengths:  Ability to meet basic needs  , Lexicographer chosen, Home prepared for child  , Understanding of illness, Psychotropic Medications, Compliance with medical plan     Psychotropic Medications:  Subutex      Pediatrician:    [facility] [place]  Pediatrician List:   [facility] (Peds)  [place]       Pediatrician Fax Number:    Risk Factors/Current Problems:  Substance Use  , Mental Health Concerns     Cognitive State:  Able to Concentrate  , Alert  , Insightful  , Goal Oriented  , Linear Thinking     Mood/Affect:  Comfortable  , Interested  , Calm  , Happy  , Relaxed     CSW Assessment: CSW met with MOB and FOB in 408 to complete an assessment for hx of substance use, MOB currently on Subutex, and MH hx. When CSW arrived, MOB was bonding with infant as evidence by engaging in skin to skin and breastfeeding. FOB was also present and MOB gave CSW verbal permission for CSW to complete clinical assessment while FOB was present. The couple was easy to engage, and FOB appeared to be a great support to MOB.  MOB was inviting, forthcoming, and receptive to meeting with CSW.    CSW inquired about MOB's prescription Subutex and MOB reported "I have managed her symptoms for years with Subutex." CSW also provide information regarding NAS and the length of stay that is encouraged for infant. The parents were not happy with the idea of infant remaining inpatient for 5 days although CSW provided the rationale.  CSW encouraged the couple to speak with infant's provider; they agreed.  CSW explained hospital's substance exposure policy and MOB and FOB were understanding. MOB acknowledged the use of THC use during pregnancy and reported her last use was "about a month ago." They couple was made aware of the 2 drug screenings for the infant.  MOB was understanding and did not have any concerns. CSW informed MOB that CSW will continue to monitor infant's UDS and  CDS and will make a report to [place] if needed. MOB acknowledged CPS involvement after the birth of last daughter for the "same reason, so we are well aware of the process." MOB denied having any questions or concerns.  CSW offered resources for outpatient SA counseling and MOB declined.   Per MOB, the family has all essential items to care  for infant and they feel prepared for infant's discharge .  CSW asked about PMAD hx and MOB denied having a hx.  MOB acknowledged a hx of anxiety and depression and reported that she has not had any symtoms in over 5 years.  CSW provided education regarding the baby blues period vs. perinatal mood disorders; MOB reported being aware.    There are no barriers to discharge.   CSW Plan/Description:  No Further Intervention Required/No Barriers to Discharge, Sudden Infant Death Syndrome (SIDS) Education, Perinatal Mood and Anxiety Disorder (PMADs) Education, Neonatal Abstinence Syndrome (NAS) Education, Other Patient/Family Education, [place], Other Information/Referral to [employer], CSW Will Continue to Monitor Umbilical Cord Tissue Drug Screen Results and Make Report if Warranted   Laurey Arrow, MSW, LCSW Clinical Social Work [phone]   Dimple Nanas, [place] 10/12/2020, 11:49 AM

## 2020-10-12 NOTE — Lactation Note (Signed)
This note was copied from a baby's chart. Lactation Consultation Note  Patient Name: Abigail Fox EVOJJ'K Date: 10/12/2020 Reason for consult: Follow-up assessment;Mother's request;Difficult latch;Term Age:33 hours term female infant, -3% weight loss, 6 voids and 3 stools.  Per mom, she only attempted latch infant once today, but she desires to breastfeed infant, infant mostly been formula feeding. Per mom, she is using DEBP every 3 hours for 15 minutes. LC reviewed hand expression and dad assisted with hand expression , mom expressed 5 mls at beginning of infant's feeding and afterwards additional 10 mls of EBM. Mom made multiple attempts to latch infant but infant would not sustain latch, mom is short shafted and has small amount of edema in right breast only. Mom fitted with 20 mm NS on her left breast that was pre-filled with 0.5 mls of EBM, mom latched infant using the football hold position, infant sustained latch and BF for 15 minutes taking 5 mls of EBM at the breast.  Afterwards infant was given 10 mls of formula by curve tip on LC's gloved finger and appeared content afterwards. Mom plans to use DEBP after eating her dinner. LC reviewed infant 's input and output. LC discussed on 2nd day of life infant may start cluster feeding and this is normal behavior. Mom's plan: 1- Mom will continue to BF infant according to feeding cues, 8 to 12+ times within 24 hours, STS 2- When latching infant at the breast mom  will apply 20 mm NS ,and  mom knows to call RN or LC if she needs latch assistance. 3-After latching infant at the breast , mom will supplement infant with any of her EBM first and then formula, she can supplement infant using curve tip syringe or bottle nipple .   4- Mom will continue to use DEBP as previously advised every 3 hours for 15 minutes on initial setting.   Maternal Data    Feeding Mother's Current Feeding Choice: Breast Milk and Formula Nipple Type: Slow -  flow  LATCH Score Latch: Grasps breast easily, tongue down, lips flanged, rhythmical sucking.  Audible Swallowing: Spontaneous and intermittent  Type of Nipple: Everted at rest and after stimulation  Comfort (Breast/Nipple): Soft / non-tender  Hold (Positioning): Assistance needed to correctly position infant at breast and maintain latch.  LATCH Score: 9   Lactation Tools Discussed/Used Tools: Nipple Shields Nipple shield size: 20 (Mom is short shafted and infant not latching at breast has been having alot of formula with bottle nipples.)  Interventions Interventions: Assisted with latch;Skin to skin;Hand express;Breast compression;Adjust position;Support pillows;Position options;Expressed milk;DEBP;Education  Discharge    Consult Status Consult Status: Follow-up Date: 10/13/20 Follow-up type: In-patient    Danelle Earthly 10/12/2020, 5:09 PM

## 2020-10-13 LAB — SURGICAL PATHOLOGY

## 2020-10-13 NOTE — Lactation Note (Addendum)
This note was copied from a baby's chart. Lactation Consultation Note  Patient Name: Abigail Fox WUJWJ'[id number] Date: 10/13/2020 Reason for consult: Follow-up assessment;Term Age:33 hours P2, term female infant -5% weight loss. Per mom, infant had 5 voids and 3 stools that are seedy and green in color, LC changed stool diaper while in room.  Per mom, infant latched at the breast once today with dad's assistance and using 20 mm NS, mom would like LC assistance with latching infant at this time.  Per mom, infant has not had formula since earlier this morning, infant been receiving only her EBM and she is now pumping 40 mls per pumping session every 3 hours for 15 minutes on initial setting. Mom latched infant on her right breast in laid back football hold position, using a 20 mm NS, infant sustained latch and audible swallows observed, infant BF for 12 minutes and colostrum was present in NS.  Afterwards while MGM was holding infant infant was given additional 10 mls of mom's EBM using purple and gold slow flow bottle nipple. Mom started pumping while LC was in room, LC applied warm compresses to knot that mom had in her right breast at the 4 o'clock position, per mom, she feels it is started to alleviate with infant latching and pumping with gentle massage and warm compresses. Mom's current plan: 1- Continue to BF infant with feeding cues, using 20 mm NS. 2- Going forward mom plans to latch infant with each feeding, mom knows to call RN or LC if she needs further latch assistance. 3- Afterwards dad will supplement infant with mom's pumped EBM based on age/ hours of life 25+ mls if latched, and if  infant does not latch at the breast using bottle only 30 -60 mls per feeding. 4- Mom will continue to use DEBP every 3 hours for 15 minutes on initial setting. 5- Mom will inform Physician or LC services if knot in her  right breast is not resolving or worsens .  6- Mom will continue to latch infant on  both breast while feeding and use warm compresses on her right breast with gentle massage.  Maternal Data Has patient been taught Hand Expression?: Yes  Feeding Mother's Current Feeding Choice: Breast Milk Nipple Type: Slow - flow  LATCH Score Latch: Grasps breast easily, tongue down, lips flanged, rhythmical sucking.  Audible Swallowing: Spontaneous and intermittent  Type of Nipple: Flat  Comfort (Breast/Nipple): Soft / non-tender  Hold (Positioning): Assistance needed to correctly position infant at breast and maintain latch.  LATCH Score: 8   Lactation Tools Discussed/Used Tools: Pump Nipple shield size: 20 Flange Size: 24 Breast pump type: Double-Electric Breast Pump Reason for Pumping: Previously infant did not latch well and mom is usng 20 mm NS due to having flat nipples. Pumping frequency: Mom is pumping every 3 hours for 15 minutes Pumped volume: 40 mL  Interventions Interventions: Skin to skin;Breast massage;Adjust position;Breast compression;Support pillows;Position options;Expressed milk;Education;DEBP  Discharge Pump: DEBP  Consult Status Consult Status: Follow-up Date: 10/14/20 Follow-up type: In-patient    Danelle Earthly 10/13/2020, 4:57 PM

## 2020-10-13 NOTE — Progress Notes (Addendum)
Postpartum Note Day # 2  S:  Patient doing well.  Pain controlled.  Tolerating regular diet.   Ambulating and voiding without difficulty. Reports crying this morning due to incisional pain. Pain has improved since taking Roxicodone. Does not feel ready for discharge home today. Working on pumping/nursing which is going well. Understands that her infant will be undergoing NAS. Requests work note for her husband who will be her support person. Denies fevers, chills, chest pain, SOB, N/V, or worsening bilateral LE edema.  Lochia: Minimal Infant feeding:  Breast/bottle Circumcision:  N/A, female infant Contraception:  S/p bilateral salpingectomy at time of CS  O: Temp:  [98 F (36.7 C)] 98 F (36.7 C) (07/08 0530) Pulse Rate:  [63] 63 (07/08 0530) Resp:  [16-18] 18 (07/08 0530) BP: (143)/(77) 143/77 (07/08 0530) SpO2:  [99 %] 99 % (07/07 1200) Gen: NAD, pleasant and cooperative CV: RRR Resp: CTAB, no wheezes/rales/rhonchi Abdomen: soft, non-distended, non-tender throughout Uterus: firm, non-tender, below umbilicus Incision: c/d/i, Prevena in place and working well Ext: Trace bilateral LE edema, no bilateral calf tenderness, SCDs on and working  Labs:  Recent Labs    10/10/20 1017 10/12/20 0513  HGB 11.8* 10.4*    A/P: Patient is a 35 y.o. [id number] POD#2 s/p repeat LTCS and bilateral salpingectomy.  S/p LTCS+BS - Pain well controlled  - GU: UOP is adequate - GI: Tolerating regular diet - Activity: encouraged sitting up to chair and ambulation as tolerated - DVT Prophylaxis: Prophylactic Lovenox - Labs: stable as above  History of opioid use - Medication: Buprenorphine 8mg  BID - Provider: Dr.   Obesity (BMI 43) - Encourage early ambulation postpartum - Will likely place on prophylactic Lovenox postpartum while inpatient   History of Hepatitis C Antibody Positive - S/p evalution by GI, no treatment indicated   Anxiety/depression - Mood stable, no current  medications   History of postpartum preeclampsia - Last BP mild range, otherwise normotensive - Suspect mild range BP secondary to pain at the time  Disposition:  D/C home POD#3   Roselie Awkward, DO [phone] (office)

## 2020-10-13 NOTE — Progress Notes (Signed)
Pt complains of tenderness to quarter size area underneath right breast. Dr. Sherryll Burger notified. No new orders given. Dr Connye Burkitt advised pt could use heat packs for discomfort and she would assess pt  tomorrow. Pt informed. Will continue to monitor pt.

## 2020-10-14 MED ORDER — OXYCODONE HCL 5 MG PO TABS: 5.0000 mg | ORAL_TABLET | ORAL | 0 refills | Status: DC | PRN

## 2020-10-14 MED ORDER — IBUPROFEN 800 MG PO TABS: 800.0000 mg | ORAL_TABLET | Freq: Three times a day (TID) | ORAL | 3 refills | Status: AC | PRN

## 2020-10-14 NOTE — Lactation Note (Signed)
This note was copied from a baby's chart. Lactation Consultation Note  Patient Name: Girl Leonda Cristo IOEVO'J Date: 10/14/2020 Reason for consult: Follow-up assessment;Term;Infant weight loss;Other (Comment) (7 % weight loss , per mom milk is in and last pumping was 50 ml off each/ and has had engorgement x 1 and the LC assisted. LC reviewed engorgement prevention and tx. mom denies soreness.) Age:33 hours   Maternal Data Has patient been taught Hand Expression?: Yes  Feeding Mother's Current Feeding Choice: Breast Milk Nipple Type: Slow - flow  LATCH Score                    Lactation Tools Discussed/Used Breast pump type: Double-Electric Breast Pump;Manual Pump Education: Setup, frequency, and cleaning;Milk Storage  Interventions Interventions: Breast feeding basics reviewed;DEBP;Hand pump;Education  Discharge Discharge Education: Engorgement and breast care Pump: Personal;DEBP  Consult Status Consult Status: Follow-up Date: 10/14/20 Follow-up type: In-patient    Matilde Sprang Anthonyjames Bargar 10/14/2020, 12:04 PM

## 2020-10-14 NOTE — Discharge Summary (Signed)
Postpartum Discharge Summary  Date of Service updated: 10/14/20      Patient Name: Abigail Fox DOB: 10/20/1985 MRN: [id number]  Date of admission: 10/11/2020 Delivery date:10/11/2020  Delivering provider: Drema Dallas  Date of discharge: 10/14/2020  Admitting diagnosis: History of cesarean delivery [Z98.891] Intrauterine pregnancy: [redacted]w[redacted]d    Secondary diagnosis:  Active Problems:   History of cesarean delivery  Additional problems:  Previous concern for early onset IUGR Large placental lake History of CS x 1 (desires repeat) Obesity (BMI 467 History of opioid use (on Buprenorphine 852mBID) History of Hepatitis C Antibody Positive Anxiety/depression History of postpartum preeclampsia Abnormal Hemoglobniopathy screen (reports FOB negative for SCT) 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 yo [id number] 3960w1ds admitted to the hospital 10/11/2020 for scheduled cesarean section with the following indication:Elective Repeat and bilateral salpingectomy. Delivery details are as follows:  Membrane Rupture Time/Date: 1:03 PM ,10/11/2020   Delivery Method:C-Section, Low Transverse  Details of operation can be found in separate operative note.  Patient had an uncomplicated postpartum course.  She is ambulating, tolerating a regular diet, passing flatus, and urinating well. She had one isolated elevated blood pressure postpartum thought to be pain-related. Otherwise, has been normotensive. Patient is discharged home in stable condition on  10/14/20        Newborn Data: Birth date:10/11/2020  Birth time:1:03 PM  Gender:Female  Living status:Living  Apgars:9 ,9  Weight:3325 g     Magnesium Sulfate received: No BMZ received: No Rhophylac:No MMR:N/A T-DaP:Given  prenatally Flu: Received prenatally Transfusion:No  Physical exam  Vitals:   10/13/20 0530 10/13/20 1456 10/13/20 2025 10/14/20 0600  BP: (!) 143/77 112/70 123/77 125/81  Pulse: 63 67 64 72  Resp: '18 17 16 17  ' Temp: 98 F (36.7 C) (!) 97.3 F (36.3 C) 98.2 F (36.8 C) 98.3 F (36.8 C)  TempSrc: Oral Oral Oral Oral  SpO2:   99% 100%  Weight:      Height:       General: alert, cooperative, and no distress Cardio: RRR Lungs:  CTAB, no wheezes, rales/rhonchi Lochia: appropriate Uterine Fundus: firm Incision: Prevena in place with adequate suction, no surrounding erythema DVT Evaluation: No evidence of DVT seen on physical exam. No cords or calf tenderness. Labs: Lab Results  Component Value Date   WBC 13.6 (H) 10/12/2020   HGB 10.4 (L) 10/12/2020   HCT 30.6 (L) 10/12/2020   MCV 91.3 10/12/2020   PLT 148 (L) 10/12/2020   CMP Latest Ref Rng & Units 10/10/2020  Glucose 70 - 99 mg/dL -  BUN 6 - 20 mg/dL -  Creatinine 0.44 - 1.00 mg/dL 0.70  Sodium 135 - 145 mmol/L -  Potassium 3.5 - 5.1 mmol/L -  Chloride 98 - 111 mmol/L -  CO2 22 - 32 mmol/L -  Calcium 8.9 - 10.3 mg/dL -  Total Protein 6.5 - 8.1 g/dL -  Total Bilirubin 0.3 - 1.2 mg/dL -  Alkaline Phos 38 - 126 U/L -  AST 15 - 41 U/L -  ALT 0 - 44 U/L -  Edinburgh Score: Edinburgh Postnatal Depression Scale Screening Tool 10/13/2020  I have been able to laugh and see the funny side of things. 0  I have looked forward with enjoyment to things. 0  I have blamed myself unnecessarily when things went wrong. 0  I have been anxious or worried for no good reason. 0  I have felt scared or panicky for no good reason. 0  Things have been getting on top of me. 0  I have been so unhappy that I have had difficulty sleeping. 0  I have felt sad or miserable. 0  I have been so unhappy that I have been crying. 0  The thought of harming myself has occurred to me. 0  Edinburgh Postnatal Depression Scale Total 0      After visit  meds:  Allergies as of 10/14/2020   No Known Allergies      Medication List     STOP taking these medications    ondansetron 8 MG disintegrating tablet Commonly known as: Zofran ODT   polyethylene glycol 17 g packet Commonly known as: MiraLax       TAKE these medications    buprenorphine 8 MG Subl SL tablet Commonly known as: SUBUTEX Place 8 mg under the tongue 2 (two) times daily.   Doxylamine-Pyridoxine 10-10 MG Tbec Take 1-2 tablets by mouth See admin instructions. 2 tabs in the morning, 2 tabs in the afternnoon, and 1 tablet at bedtime   famotidine 20 MG tablet Commonly known as: PEPCID Take 1 tablet (20 mg total) by mouth 2 (two) times daily.   ferrous sulfate 325 (65 FE) MG tablet Take 325 mg by mouth 2 (two) times daily with a meal.   ibuprofen 800 MG tablet Commonly known as: ADVIL Take 1 tablet (800 mg total) by mouth every 8 (eight) hours as needed.   oxyCODONE 5 MG immediate release tablet Commonly known as: Oxy IR/ROXICODONE Take 1-2 tablets (5-10 mg total) by mouth every 4 (four) hours as needed for moderate pain.   prenatal multivitamin Tabs tablet Take 1 tablet by mouth daily at 12 noon.   valACYclovir 500 MG tablet Commonly known as: VALTREX Take 500 mg by mouth 2 (two) times daily.         Discharge home in stable condition Infant Feeding: Breast Infant Disposition:home with mother Discharge instruction: per After Visit Summary and Postpartum booklet. Activity: Advance as tolerated. Pelvic rest for 6 weeks.  Diet: routine diet Anticipated Birth Control:  S/p bilateral salpingectomy at time of cesarean section Postpartum Appointment:6 weeks Additional Postpartum F/U: Incision check 1 week and BP check 1 week Future Appointments:No future appointments. Follow up Visit:  Follow-up Information     Drema Dallas, DO Follow up in 1 week(s).   Specialty: Obstetrics and Gynecology Why: Our office will contact you with a 1 week incision  check/Prevana removal and 6 week postpartum visit. Contact information: [address] [place] [address] [phone]                     10/14/2020 Drema Dallas, DO

## 2020-10-14 NOTE — Lactation Note (Signed)
This note was copied from a baby's chart. Lactation Consultation Note  Patient Name: Abigail Fox [id number] Date: 10/14/2020 Reason for consult: Follow-up assessment;Difficult latch Age:33 hours  RN requested LC for latch assistance.  Mom has attempted to BF infant 3 times.  Mom is pumping and collected 51ml.  Pumping is comfortable but she desires to latch. Mom is using a size 20NS. Dad helps mom hand express.  Mom has large breasts and states she has a hard time mangaging and coordinating latching.    Upon oral assessment, infant clamped down on LC's finger.  Infant has a high palate.  She would begin sucking then her jaws would tighten and infant would clamp down again.  Infant has visible lingual frenulum towards the posterior area.     LC suggested football hold.  Several blankets used for support and a rolled blanket was used under breast for added support.  Infant was unable to latch without shield.  She initially would obtain latch but then lose suction once beginning to feed.  Teacup hold used and infant would not open widely enough to sustain latch.    NS used and prefilled with mom's EBM.  Infant latched to the right side and multiple swallows were heard.  Mom used massage to keep infant sucking.  Swallows easily heard. Infant fed 15 minutes then attempted to latch to the other side for 5.  The left side infant would not open as wide and rhythmic suck was only noted for a couple of minutes.  Arms were drawn up tight.  LC suggested bottle feeding some of mom's EBM to infant due to infant cueing after BF.   LC highly encouraged mom to schedule OP LC appt. With [facility] LC, Noralee Stain, RN IBCLC.    Phone number for phone line, OP LC office, and support group info given.  Epic message sent   Maternal Data Has patient been taught Hand Expression?: Yes  Feeding Mother's Current Feeding Choice: Breast Milk  LATCH Score                    Lactation Tools  Discussed/Used Tools: Nipple Shields Nipple shield size: 20 Flange Size: 24 Breast pump type: Double-Electric Breast Pump Pump Education: Setup, frequency, and cleaning Pumped volume: 60 mL  Interventions Interventions: Breast feeding basics reviewed;Assisted with latch;Skin to skin;Breast compression;Adjust position;Support pillows;Breast massage;Hand express;Expressed milk;Education  Discharge Discharge Education: Warning signs for feeding baby;Outpatient recommendation;Outpatient Epic message sent Pump: DEBP;Personal WIC Program: No  Consult Status Consult Status: Follow-up Date: 10/15/20 Follow-up type: In-patient    Abigail Fox [facility] 10/14/2020, 3:02 PM

## 2020-10-15 ENCOUNTER — Ambulatory Visit: Payer: Self-pay

## 2020-10-15 NOTE — Lactation Note (Signed)
This note was copied from a baby's chart. Lactation Consultation Note  Patient Name: Abigail Fox DGLOV'F Date: 10/15/2020 Reason for consult: Follow-up assessment;Term;Difficult latch;Infant weight loss;Other (Comment) (baby has gained since yesterday , moms milk has been in / continues to be consistent with pumping) Age:33 days Per mom since the [facility] consult yesterday/per mom has latched the baby x2 with the NS. Continuing to pump around the clock. Per mom her DEBP should be delivered today. Mom was asking what other resources - LC recommended checking with the gift shop of rental. Mom is not active with WIC. Mom requesting to latch the baby with LC . Mom mentioned the previous LC mentioned baby had a tongue - tie.  LC assessed and noted a midline indentation and a short skin notch under the mid section of the tongue. Upper lip stretches well and the skin notch under the upper lip is above the gum line.  Baby awake and hungry.  LC resized for the #20 NS and #24 NS . Both boarder line fit and baby accommodates the #20 NS better than the #24 NS.  Latched on the left breast / football / for 10 mins/ swallows noted and milk in the NS after the baby released.  LC reviewed  the recommended LC plan.  Breast shells 10 mins before the feeding or between feedings except when sleeping.  ( Goal is to stretch the nipple / areola complex so the NS fits better)  Prior to latch breast massage, hand express, pre pump with the Hand pump and then apply the NS , instill EBM into the top and latch the baby with firm support.  Have dad check the lip line for flanged lips.  After feeding supplement at least 30 ml of EBM or greater ( meet the baby's hunger cues)  Post pump both breast for 15 mins / save milk.   Mom receptive to coming back for [facility] O/P and LC placed a request in epic.  Mom aware she will receive a call tomorrow.    Maternal Data    Feeding    LATCH Score Latch: Grasps breast easily, tongue  down, lips flanged, rhythmical sucking.  Audible Swallowing: Spontaneous and intermittent  Type of Nipple: Everted at rest and after stimulation  Comfort (Breast/Nipple): Filling, red/small blisters or bruises, mild/mod discomfort  Hold (Positioning): Assistance needed to correctly position infant at breast and maintain latch.  LATCH Score: 8   Lactation Tools Discussed/Used Tools: Shells;Pump;Flanges Nipple shield size: 20;24;Other (comment) (both #20 NS and #24 NS borderline fit/ baby accommodated the #20 NS better and milk in the NS after baby released) Flange Size: 24 Breast pump type: Manual;Double-Electric Breast Pump Pumping frequency: mom has been consistent with pumping and volumes are consistent -  Interventions Interventions: Breast feeding basics reviewed;Assisted with latch;Skin to skin;Breast massage;Hand express;Breast compression;Adjust position;Position options;Support pillows;Education;Shells;Hand pump;DEBP  Discharge Discharge Education: Engorgement and breast care;Warning signs for feeding baby Pump: DEBP;Manual;Personal  Consult Status Consult Status: Complete Date: 10/15/20    [name] 10/15/2020, 9:28 AM

## 2020-10-23 DIAGNOSIS — F1120 Opioid dependence, uncomplicated: Secondary | ICD-10-CM | POA: Diagnosis not present

## 2020-10-24 ENCOUNTER — Telehealth (HOSPITAL_COMMUNITY): Payer: Self-pay | Admitting: *Deleted

## 2020-10-24 NOTE — Telephone Encounter (Signed)
Left message to return nurse call.  Duffy Rhody, RN 10-24-2020 at 2:00pm

## 2020-10-25 ENCOUNTER — Encounter (HOSPITAL_COMMUNITY): Payer: Self-pay | Admitting: Obstetrics and Gynecology

## 2020-10-25 ENCOUNTER — Inpatient Hospital Stay (HOSPITAL_COMMUNITY): Payer: Medicaid Other

## 2020-10-25 ENCOUNTER — Inpatient Hospital Stay (HOSPITAL_COMMUNITY)
Admission: AD | Admit: 2020-10-25 | Discharge: 2020-10-28 | DRG: 776 | Disposition: A | Discharge status: Home / Self Care | Payer: Medicaid Other | Attending: Obstetrics and Gynecology | Admitting: Obstetrics and Gynecology

## 2020-10-25 DIAGNOSIS — E876 Hypokalemia: Secondary | ICD-10-CM | POA: Diagnosis present

## 2020-10-25 DIAGNOSIS — O9089 Other complications of the puerperium, not elsewhere classified: Secondary | ICD-10-CM

## 2020-10-25 DIAGNOSIS — O903 Peripartum cardiomyopathy: Principal | ICD-10-CM | POA: Diagnosis present

## 2020-10-25 DIAGNOSIS — O99285 Endocrine, nutritional and metabolic diseases complicating the puerperium: Secondary | ICD-10-CM | POA: Diagnosis present

## 2020-10-25 DIAGNOSIS — R0989 Other specified symptoms and signs involving the circulatory and respiratory systems: Secondary | ICD-10-CM | POA: Diagnosis not present

## 2020-10-25 DIAGNOSIS — Z8614 Personal history of Methicillin resistant Staphylococcus aureus infection: Secondary | ICD-10-CM

## 2020-10-25 DIAGNOSIS — J810 Acute pulmonary edema: Secondary | ICD-10-CM

## 2020-10-25 DIAGNOSIS — R0602 Shortness of breath: Secondary | ICD-10-CM | POA: Diagnosis not present

## 2020-10-25 DIAGNOSIS — Z20822 Contact with and (suspected) exposure to covid-19: Secondary | ICD-10-CM | POA: Diagnosis present

## 2020-10-25 DIAGNOSIS — O1495 Unspecified pre-eclampsia, complicating the puerperium: Secondary | ICD-10-CM | POA: Diagnosis present

## 2020-10-25 DIAGNOSIS — O99325 Drug use complicating the puerperium: Secondary | ICD-10-CM | POA: Diagnosis present

## 2020-10-25 DIAGNOSIS — F1920 Other psychoactive substance dependence, uncomplicated: Secondary | ICD-10-CM | POA: Diagnosis present

## 2020-10-25 LAB — URINALYSIS, ROUTINE W REFLEX MICROSCOPIC
Bacteria, UA: NONE SEEN
Bilirubin Urine: NEGATIVE
Glucose, UA: NEGATIVE mg/dL
Ketones, ur: 5 mg/dL — AB
Nitrite: NEGATIVE
Protein, ur: NEGATIVE mg/dL
Specific Gravity, Urine: 1.013 (ref 1.005–1.030)
pH: 5 (ref 5.0–8.0)

## 2020-10-25 LAB — CBC
HCT: 25.3 % — ABNORMAL LOW (ref 36.0–46.0)
Hemoglobin: 8.1 g/dL — ABNORMAL LOW (ref 12.0–15.0)
MCH: 30 pg (ref 26.0–34.0)
MCHC: 32 g/dL (ref 30.0–36.0)
MCV: 93.7 fL (ref 80.0–100.0)
Platelets: 234 10*3/uL (ref 150–400)
RBC: 2.7 MIL/uL — ABNORMAL LOW (ref 3.87–5.11)
RDW: 14.6 % (ref 11.5–15.5)
WBC: 12.1 10*3/uL — ABNORMAL HIGH (ref 4.0–10.5)
nRBC: 0.3 % — ABNORMAL HIGH (ref 0.0–0.2)

## 2020-10-25 MED ORDER — FUROSEMIDE 10 MG/ML IJ SOLN
40.0000 mg | Freq: Once | INTRAMUSCULAR | Status: AC
  Administered 2020-10-25: 40 mg via INTRAVENOUS
  Filled 2020-10-25: qty 4

## 2020-10-25 NOTE — MAU Provider Note (Signed)
Chief Complaint: Dizziness, Leg Swelling, Headache (/), and Shortness of Breath   Event Date/Time   First Provider Initiated Contact with Patient 10/25/20 2248      SUBJECTIVE HPI: Abigail Fox is a 35 y.o. 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  She reports this feels like when she had preeclampsia with her last baby.  She describes her headache as throbbing, frontal, and worsening since onset 2 days ago.  Her swelling is worsening and her legs and abdomen are swollen and painful.  She feels like she cannot catch her breath and feels better sitting up to breath. There are no other signs/symptoms.   HPI  Past Medical History:  Diagnosis Date   Anxiety    Drug addiction (HCC)    subutex   Hepatitis    hepatitis C   Kidney stones    MRSA infection 2010,2013   Ovarian cyst    Past Surgical History:  Procedure Laterality Date   CESAREAN SECTION N/A 06/08/2017   Procedure: CESAREAN SECTION;  Surgeon: Geryl Rankins, MD;  Location: [facility] BIRTHING SUITES;  Service: Obstetrics;  Laterality: N/A;   CESAREAN SECTION Bilateral 10/11/2020   Procedure: REPEAT CESAREAN SECTION WITH BILATERAL SALPINGECTOMY.;  Surgeon: Steva Ready, DO;  Location: [facility] LD ORS;  Service: Obstetrics;  Laterality: Bilateral;   CHOLECYSTECTOMY N/A 01/15/2018   Procedure: LAPAROSCOPIC CHOLECYSTECTOMY WITH INTRAOPERATIVE CHOLANGIOGRAM ERAS PATHWAY;  Surgeon: Gaynelle Adu, MD;  Location: [facility] ORS;  Service: General;  Laterality: N/A;   DILATION AND EVACUATION N/A 05/14/2016   Procedure: DILATATION AND EVACUATION (D&E) 2ND TRIMESTER WITH ULTRA SOUND GUIDANCE;  Surgeon: Tereso Newcomer, MD;  Location: [facility] ORS;  Service: Gynecology;  Laterality: N/A;   miscarriage     TOOTH EXTRACTION     Social History    Socioeconomic History   Marital status: Married    Spouse name: Not on file   Number of children: 1   Years of education: Not on file   Highest education level: Not on file  Occupational History   Occupation: customer srevice  Tobacco Use   Smoking status: Never   Smokeless tobacco: Never  Vaping Use   Vaping Use: Never used  Substance and Sexual Activity   Alcohol use: No   Drug use: Not Currently    Types: Marijuana    Comment: February 15, 2020   Sexual activity: Yes    Birth control/protection: None  Other Topics Concern   Not on file  Social History Narrative   Not on file   Social Determinants of Health   Financial Resource Strain: Not on file  Food Insecurity: Not on file  Transportation Needs: Not on file  Physical Activity: Not on file  Stress: Not on file  Social Connections: Not on file  Intimate Partner Violence: Not on file   No current facility-administered medications on file prior to encounter.   Current Outpatient Medications on File Prior to Encounter  Medication Sig Dispense Refill   acetaminophen (TYLENOL) 500 MG tablet Take 500 mg by mouth every 6 (six) hours as needed.     buprenorphine (SUBUTEX) 8 MG SUBL SL tablet Place 8 mg under the tongue 2 (two) times daily.  0   ibuprofen (ADVIL) 800 MG tablet Take 1 tablet (800 mg total) by mouth every 8 (eight) hours  as needed. 60 tablet 3   Prenatal Vit-Fe Fumarate-FA (PRENATAL MULTIVITAMIN) TABS tablet Take 1 tablet by mouth daily at 12 noon.     valACYclovir (VALTREX) 500 MG tablet Take 500 mg by mouth 2 (two) times daily.  1   Doxylamine-Pyridoxine 10-10 MG TBEC Take 1-2 tablets by mouth See admin instructions. 2 tabs in the morning, 2 tabs in the afternnoon, and 1 tablet at bedtime     famotidine (PEPCID) 20 MG tablet Take 1 tablet (20 mg total) by mouth 2 (two) times daily. 60 tablet 1   ferrous sulfate 325 (65 FE) MG tablet Take 325 mg by mouth 2 (two) times daily with a meal.     oxyCODONE (OXY  IR/ROXICODONE) 5 MG immediate release tablet Take 1-2 tablets (5-10 mg total) by mouth every 4 (four) hours as needed for moderate pain. 30 tablet 0   No Known Allergies  ROS:  Review of Systems  Constitutional:  Negative for chills, fatigue and fever.  Respiratory:  Positive for cough, chest tightness and shortness of breath.   Cardiovascular:  Positive for leg swelling. Negative for chest pain.  Gastrointestinal:  Positive for abdominal distention.  Genitourinary:  Negative for difficulty urinating, dysuria, flank pain, pelvic pain, vaginal bleeding, vaginal discharge and vaginal pain.  Neurological:  Positive for dizziness and headaches.  Psychiatric/Behavioral: Negative.      I have reviewed patient's Past Medical Hx, Surgical Hx, Family Hx, Social Hx, medications and allergies.   Physical Exam  Patient Vitals for the past 24 hrs:  BP Temp Pulse Resp SpO2 Height Weight  10/25/20 2352 -- -- -- -- 96 % -- --  10/25/20 2335 -- -- -- -- 94 % -- --  10/25/20 2326 -- -- -- -- 93 % -- --  10/25/20 2316 -- -- -- -- 93 % -- --  10/25/20 2311 -- -- -- -- 93 % -- --  10/25/20 2306 -- -- -- -- 92 % -- --  10/25/20 2301 -- -- -- -- 91 % -- --  10/25/20 2246 -- -- -- -- 92 % -- --  10/25/20 2241 -- -- -- -- (!) 89 % -- --  10/25/20 2236 -- -- -- -- 92 % -- --  10/25/20 2231 -- -- -- -- (!) 86 % -- --  10/25/20 2230 -- -- -- -- (!) 86 % -- --  10/25/20 2226 -- -- -- -- (!) 86 % -- --  10/25/20 2221 -- -- -- -- (!) 84 % -- --  10/25/20 2217 (!) 136/53 -- 71 -- -- -- --  10/25/20 2216 -- -- -- -- (!) 84 % -- --  10/25/20 2211 -- -- -- -- (!) 81 % -- --  10/25/20 2158 139/82 -- -- -- -- -- --  10/25/20 2155 -- 99 F (37.2 C) 72 20 (!) 87 % 5\' 4"  (1.626 m) 121.1 kg   Constitutional: Well-developed, well-nourished female in moderate distress.  HEART: normal rate, heart sounds, regular rhythm RESP: some increased effort with respirations, lung sounds clear and equal bilaterally but  diminished bilaterally in lower lobes GI: Abd soft, non-tender. Pos BS x 4 MS: Extremities nontender, no edema, normal ROM Neurologic: Alert and oriented x 4.  GU: Neg CVAT.   LAB RESULTS Results for orders placed or performed during the hospital encounter of 10/25/20 (from the past 24 hour(s))  CBC     Status: Abnormal   Collection Time: 10/25/20 10:44 PM  Result Value Ref Range   WBC  12.1 (H) 4.0 - 10.5 K/uL   RBC 2.70 (L) 3.87 - 5.11 MIL/uL   Hemoglobin 8.1 (L) 12.0 - 15.0 g/dL   HCT 14.7 (L) 82.9 - 56.2 %   MCV 93.7 80.0 - 100.0 fL   MCH 30.0 26.0 - 34.0 pg   MCHC 32.0 30.0 - 36.0 g/dL   RDW 13.0 86.5 - 78.4 %   Platelets 234 150 - 400 K/uL   nRBC 0.3 (H) 0.0 - 0.2 %  Protein / creatinine ratio, urine     Status: None   Collection Time: 10/25/20 10:44 PM  Result Value Ref Range   Creatinine, Urine 113.44 mg/dL   Total Protein, Urine 11 mg/dL   Protein Creatinine Ratio 0.10 0.00 - 0.15 mg/mg[Cre]  Urinalysis, Routine w reflex microscopic     Status: Abnormal   Collection Time: 10/25/20 10:44 PM  Result Value Ref Range   Color, Urine YELLOW YELLOW   APPearance CLEAR CLEAR   Specific Gravity, Urine 1.013 1.005 - 1.030   pH 5.0 5.0 - 8.0   Glucose, UA NEGATIVE NEGATIVE mg/dL   Hgb urine dipstick MODERATE (A) NEGATIVE   Bilirubin Urine NEGATIVE NEGATIVE   Ketones, ur 5 (A) NEGATIVE mg/dL   Protein, ur NEGATIVE NEGATIVE mg/dL   Nitrite NEGATIVE NEGATIVE   Leukocytes,Ua TRACE (A) NEGATIVE   RBC / HPF 6-10 0 - 5 RBC/hpf   WBC, UA 6-10 0 - 5 WBC/hpf   Bacteria, UA NONE SEEN NONE SEEN   Squamous Epithelial / LPF 0-5 0 - 5   Mucus PRESENT     --/--/O POS (07/05 1021)  IMAGING  MAU Management/MDM: Orders Placed This Encounter  Procedures   DG Chest Portable 1 View   CBC   Comprehensive metabolic panel   Protein / creatinine ratio, urine   Urinalysis, Routine w reflex microscopic   Brain natriuretic peptide   Oxygen therapy Mode or (Route): Nasal cannula; Liters  Per Minute: 4; Keep 02 saturation: 90   ED EKG   Saline lock IV    Meds ordered this encounter  Medications   furosemide (LASIX) injection 40 mg    CXR confirms pulmonary edema.  Pt SOB improved after Lasix given IV and oxygen decreased from 4 to 2 L via [place].  PEC labs pending.  Consult Dr Richardson Dopp who admits patient to [facility] for further management.    ASSESSMENT 1. Acute pulmonary edema (HCC)   2. Postpartum complication     PLAN Admit to [facility] Unit   Sharen Counter Certified Nurse-Midwife 10/26/2020  12:07 AM

## 2020-10-25 NOTE — MAU Note (Signed)
Had repeat C/S 10/11/20 with BTL. For last few days has had headache, swelling, dizziness, and shortness of breath. Has a cough. Feels like when I had preeclampsia with last baby

## 2020-10-26 ENCOUNTER — Other Ambulatory Visit: Payer: Self-pay

## 2020-10-26 ENCOUNTER — Inpatient Hospital Stay (HOSPITAL_COMMUNITY): Payer: Medicaid Other

## 2020-10-26 ENCOUNTER — Encounter (HOSPITAL_COMMUNITY): Payer: Self-pay | Admitting: Obstetrics and Gynecology

## 2020-10-26 DIAGNOSIS — R0602 Shortness of breath: Secondary | ICD-10-CM | POA: Diagnosis not present

## 2020-10-26 DIAGNOSIS — R0989 Other specified symptoms and signs involving the circulatory and respiratory systems: Secondary | ICD-10-CM | POA: Diagnosis not present

## 2020-10-26 DIAGNOSIS — Z8614 Personal history of Methicillin resistant Staphylococcus aureus infection: Secondary | ICD-10-CM | POA: Diagnosis not present

## 2020-10-26 DIAGNOSIS — O1495 Unspecified pre-eclampsia, complicating the puerperium: Secondary | ICD-10-CM | POA: Diagnosis present

## 2020-10-26 DIAGNOSIS — F1920 Other psychoactive substance dependence, uncomplicated: Secondary | ICD-10-CM | POA: Diagnosis present

## 2020-10-26 DIAGNOSIS — I5031 Acute diastolic (congestive) heart failure: Secondary | ICD-10-CM

## 2020-10-26 DIAGNOSIS — R110 Nausea: Secondary | ICD-10-CM | POA: Diagnosis not present

## 2020-10-26 DIAGNOSIS — O99325 Drug use complicating the puerperium: Secondary | ICD-10-CM | POA: Diagnosis present

## 2020-10-26 DIAGNOSIS — Z20822 Contact with and (suspected) exposure to covid-19: Secondary | ICD-10-CM | POA: Diagnosis present

## 2020-10-26 DIAGNOSIS — R519 Headache, unspecified: Secondary | ICD-10-CM | POA: Diagnosis not present

## 2020-10-26 DIAGNOSIS — I2699 Other pulmonary embolism without acute cor pulmonale: Secondary | ICD-10-CM

## 2020-10-26 DIAGNOSIS — E876 Hypokalemia: Secondary | ICD-10-CM | POA: Diagnosis present

## 2020-10-26 DIAGNOSIS — O903 Peripartum cardiomyopathy: Secondary | ICD-10-CM | POA: Diagnosis present

## 2020-10-26 DIAGNOSIS — Z98891 History of uterine scar from previous surgery: Secondary | ICD-10-CM | POA: Diagnosis not present

## 2020-10-26 DIAGNOSIS — O99285 Endocrine, nutritional and metabolic diseases complicating the puerperium: Secondary | ICD-10-CM | POA: Diagnosis present

## 2020-10-26 LAB — COMPREHENSIVE METABOLIC PANEL
ALT: 27 U/L (ref 0–44)
AST: 26 U/L (ref 15–41)
Albumin: 3.4 g/dL — ABNORMAL LOW (ref 3.5–5.0)
Alkaline Phosphatase: 97 U/L (ref 38–126)
Anion gap: 7 (ref 5–15)
BUN: 9 mg/dL (ref 6–20)
CO2: 22 mmol/L (ref 22–32)
Calcium: 8.1 mg/dL — ABNORMAL LOW (ref 8.9–10.3)
Chloride: 105 mmol/L (ref 98–111)
Creatinine, Ser: 0.85 mg/dL (ref 0.44–1.00)
GFR, Estimated: >60 mL/min (ref >60)
Glucose, Bld: 90 mg/dL (ref 70–99)
Potassium: 3.2 mmol/L — ABNORMAL LOW (ref 3.5–5.1)
Sodium: 134 mmol/L — ABNORMAL LOW (ref 135–145)
Total Bilirubin: 2.1 mg/dL — ABNORMAL HIGH (ref 0.3–1.2)
Total Protein: 6.5 g/dL (ref 6.5–8.1)

## 2020-10-26 LAB — RESP PANEL BY RT-PCR (FLU A&B, COVID) ARPGX2
Influenza A by PCR: NEGATIVE
Influenza B by PCR: NEGATIVE
SARS Coronavirus 2 by RT PCR: NEGATIVE

## 2020-10-26 LAB — ECHOCARDIOGRAM COMPLETE
Area-P 1/2: 4.33 cm2
Height: 64 in
S' Lateral: 3.5 cm
Weight: 4272 oz

## 2020-10-26 LAB — BRAIN NATRIURETIC PEPTIDE: B Natriuretic Peptide: 181.5 pg/mL — ABNORMAL HIGH (ref 0.0–100.0)

## 2020-10-26 LAB — PROTEIN / CREATININE RATIO, URINE
Creatinine, Urine: 113.44 mg/dL
Protein Creatinine Ratio: 0.1 mg/mg{Cre} (ref 0.00–0.15)
Total Protein, Urine: 11 mg/dL

## 2020-10-26 MED ORDER — BUPRENORPHINE HCL 8 MG SL SUBL: 16.0000 mg | SUBLINGUAL_TABLET | Freq: Every day | SUBLINGUAL | Status: DC

## 2020-10-26 MED ORDER — GUAIFENESIN 100 MG/5ML PO SOLN
10.0000 mL | ORAL | Status: DC | PRN
  Administered 2020-10-26 – 2020-10-28 (×3): 200 mg via ORAL
  Filled 2020-10-26 (×3): qty 15

## 2020-10-26 MED ORDER — FUROSEMIDE 10 MG/ML IJ SOLN
20.0000 mg | Freq: Once | INTRAMUSCULAR | Status: AC
  Administered 2020-10-26: 20 mg via INTRAVENOUS
  Filled 2020-10-26: qty 2

## 2020-10-26 MED ORDER — MAGNESIUM SULFATE 40 GM/1000ML IV SOLN
2.0000 g/h | INTRAVENOUS | Status: DC
  Administered 2020-10-26 (×2): 2 g/h via INTRAVENOUS
  Filled 2020-10-26 (×2): qty 1000

## 2020-10-26 MED ORDER — VALACYCLOVIR HCL 500 MG PO TABS: 500.0000 mg | ORAL_TABLET | Freq: Every day | ORAL | Status: DC

## 2020-10-26 MED ORDER — MAGNESIUM SULFATE BOLUS VIA INFUSION
4.0000 g | Freq: Once | INTRAVENOUS | Status: AC
  Administered 2020-10-26: 4 g via INTRAVENOUS
  Filled 2020-10-26: qty 1000

## 2020-10-26 MED ORDER — LACTATED RINGERS IV SOLN: INTRAVENOUS | Status: DC

## 2020-10-26 MED ORDER — FUROSEMIDE 10 MG/ML IJ SOLN
40.0000 mg | Freq: Two times a day (BID) | INTRAMUSCULAR | Status: DC
  Administered 2020-10-26 – 2020-10-28 (×4): 40 mg via INTRAVENOUS
  Filled 2020-10-26 (×4): qty 4

## 2020-10-26 MED ORDER — BUTALBITAL-APAP-CAFFEINE 50-325-40 MG PO TABS
2.0000 | ORAL_TABLET | Freq: Four times a day (QID) | ORAL | Status: DC | PRN
  Administered 2020-10-26 – 2020-10-28 (×7): 2 via ORAL
  Filled 2020-10-26 (×7): qty 2

## 2020-10-26 MED ORDER — POTASSIUM CHLORIDE CRYS ER 20 MEQ PO TBCR
20.0000 meq | EXTENDED_RELEASE_TABLET | Freq: Two times a day (BID) | ORAL | Status: DC
  Administered 2020-10-26 – 2020-10-28 (×5): 20 meq via ORAL
  Filled 2020-10-26 (×5): qty 1

## 2020-10-26 MED ORDER — PRENATAL MULTIVITAMIN CH: 1.0000 | ORAL_TABLET | Freq: Every morning | ORAL | Status: DC

## 2020-10-26 MED ORDER — HYDRALAZINE HCL 20 MG/ML IJ SOLN: 5.0000 mg | INTRAMUSCULAR | Status: DC | PRN

## 2020-10-26 MED ORDER — LABETALOL HCL 5 MG/ML IV SOLN: 40.0000 mg | INTRAVENOUS | Status: DC | PRN

## 2020-10-26 MED ORDER — BUPRENORPHINE HCL 8 MG SL SUBL
8.0000 mg | SUBLINGUAL_TABLET | Freq: Two times a day (BID) | SUBLINGUAL | Status: DC
  Administered 2020-10-26 – 2020-10-28 (×5): 8 mg via SUBLINGUAL
  Filled 2020-10-26 (×5): qty 1

## 2020-10-26 MED ORDER — LABETALOL HCL 5 MG/ML IV SOLN: 20.0000 mg | INTRAVENOUS | Status: DC | PRN

## 2020-10-26 MED ORDER — PRENATAL MULTIVITAMIN CH
1.0000 | ORAL_TABLET | Freq: Every morning | ORAL | Status: DC
  Administered 2020-10-26 – 2020-10-28 (×3): 1 via ORAL
  Filled 2020-10-26 (×3): qty 1

## 2020-10-26 MED ORDER — ORAL CARE MOUTH RINSE
15.0000 mL | Freq: Two times a day (BID) | OROMUCOSAL | Status: DC
  Administered 2020-10-26 – 2020-10-28 (×5): 15 mL via OROMUCOSAL

## 2020-10-26 MED ORDER — VALACYCLOVIR HCL 500 MG PO TABS
500.0000 mg | ORAL_TABLET | Freq: Every day | ORAL | Status: DC
  Administered 2020-10-26 – 2020-10-28 (×3): 500 mg via ORAL
  Filled 2020-10-26 (×3): qty 1

## 2020-10-26 MED ORDER — HYDRALAZINE HCL 20 MG/ML IJ SOLN: 10.0000 mg | INTRAMUSCULAR | Status: DC | PRN

## 2020-10-26 MED ORDER — ENALAPRIL MALEATE 2.5 MG PO TABS
2.5000 mg | ORAL_TABLET | Freq: Two times a day (BID) | ORAL | Status: AC
  Administered 2020-10-26 – 2020-10-27 (×4): 2.5 mg via ORAL
  Filled 2020-10-26 (×5): qty 1

## 2020-10-26 MED ORDER — ONDANSETRON 4 MG PO TBDP
8.0000 mg | ORAL_TABLET | Freq: Three times a day (TID) | ORAL | Status: DC | PRN
  Administered 2020-10-26: 8 mg via ORAL
  Filled 2020-10-26: qty 2

## 2020-10-26 NOTE — H&P (Signed)
Abigail Fox is an 35 y.o. female. [id number] s/p repeat cesarean section with bilateral salpingectomy  on -10/11/2020 presents complaining of SOB that started 4 days ago got worse today. She has a headache and nausea. The sob is worse when lying flat. She has sleep in a chair the last few nights due to sob when lying down. She has a h/o preeclampsia with previous pregnancy. States this feels similar to how she felt last time however she did not have the SOB. In MAU she received 40 mg of lasix due to decreased O2 sates in the 80's and pulmonary edema on CXRAY.  Pt states SOB has improved since receiving the lasix. Prenatal care provided by Dr. Steva Ready with [facility].      Past Medical History:  Diagnosis Date   Anxiety    Drug addiction (HCC)    subutex   Hepatitis    hepatitis C   Kidney stones    MRSA infection 2010,2013   Ovarian cyst     Past Surgical History:  Procedure Laterality Date   CESAREAN SECTION N/A 06/08/2017   Procedure: CESAREAN SECTION;  Surgeon: Geryl Rankins, MD;  Location: [facility] BIRTHING SUITES;  Service: Obstetrics;  Laterality: N/A;   CESAREAN SECTION Bilateral 10/11/2020   Procedure: REPEAT CESAREAN SECTION WITH BILATERAL SALPINGECTOMY.;  Surgeon: Steva Ready, DO;  Location: [facility] LD ORS;  Service: Obstetrics;  Laterality: Bilateral;   CHOLECYSTECTOMY N/A 01/15/2018   Procedure: LAPAROSCOPIC CHOLECYSTECTOMY WITH INTRAOPERATIVE CHOLANGIOGRAM ERAS PATHWAY;  Surgeon: Gaynelle Adu, MD;  Location: [facility] ORS;  Service: General;  Laterality: N/A;   DILATION AND EVACUATION N/A 05/14/2016   Procedure: DILATATION AND EVACUATION (D&E) 2ND TRIMESTER WITH ULTRA SOUND GUIDANCE;  Surgeon: Tereso Newcomer, MD;  Location: [facility] ORS;  Service: Gynecology;  Laterality: N/A;   miscarriage     TOOTH EXTRACTION      Family History  Problem Relation Age of Onset   Diabetes Mother    Hypertension Mother    Heart disease Maternal Grandfather    Kidney cancer Maternal Grandmother     Lung cancer Paternal Grandmother     Social History:  reports that she has never smoked. She has never used smokeless tobacco. She reports previous drug use. Drug: Marijuana. She reports that she does not drink alcohol.  Allergies: No Known Allergies  Medications Prior to Admission  Medication Sig Dispense Refill Last Dose   acetaminophen (TYLENOL) 500 MG tablet Take 500 mg by mouth every 6 (six) hours as needed.   10/25/2020   buprenorphine (SUBUTEX) 8 MG SUBL SL tablet Place 8 mg under the tongue 2 (two) times daily.  0 10/25/2020   ibuprofen (ADVIL) 800 MG tablet Take 1 tablet (800 mg total) by mouth every 8 (eight) hours as needed. 60 tablet 3 10/25/2020   Prenatal Vit-Fe Fumarate-FA (PRENATAL MULTIVITAMIN) TABS tablet Take 1 tablet by mouth daily at 12 noon.   10/25/2020   valACYclovir (VALTREX) 500 MG tablet Take 500 mg by mouth 2 (two) times daily.  1 10/25/2020   Doxylamine-Pyridoxine 10-10 MG TBEC Take 1-2 tablets by mouth See admin instructions. 2 tabs in the morning, 2 tabs in the afternnoon, and 1 tablet at bedtime   Unknown   famotidine (PEPCID) 20 MG tablet Take 1 tablet (20 mg total) by mouth 2 (two) times daily. 60 tablet 1 Unknown   ferrous sulfate 325 (65 FE) MG tablet Take 325 mg by mouth 2 (two) times daily with a meal.   Unknown   oxyCODONE (  OXY IR/ROXICODONE) 5 MG immediate release tablet Take 1-2 tablets (5-10 mg total) by mouth every 4 (four) hours as needed for moderate pain. 30 tablet 0     Review of Systems  Constitutional:  Positive for fatigue.  HENT: Negative.    Eyes: Negative.   Respiratory:  Positive for shortness of breath.   Cardiovascular:  Positive for leg swelling.  Gastrointestinal:  Positive for nausea.  Endocrine: Negative.   Genitourinary: Negative.   Musculoskeletal: Negative.   Skin: Negative.   Allergic/Immunologic: Negative.   Neurological:  Positive for light-headedness and headaches.  Hematological: Negative.   Psychiatric/Behavioral:  Negative.     Blood pressure (!) 136/53, pulse 71, temperature 99 F (37.2 C), resp. rate 20, height 5\' 4"  (1.626 m), weight 121.1 kg, SpO2 96 %, currently breastfeeding. Physical Exam Vitals reviewed.  Constitutional:      Appearance: She is well-developed.  HENT:     Head: Normocephalic and atraumatic.  Cardiovascular:     Rate and Rhythm: Normal rate and regular rhythm.  Pulmonary:     Effort: Pulmonary effort is normal.     Breath sounds: Normal breath sounds. No wheezing or rhonchi.  Abdominal:     General: Bowel sounds are normal.     Palpations: Abdomen is soft.     Tenderness: There is no abdominal tenderness. There is no guarding.  Musculoskeletal:        General: Swelling present. Normal range of motion.     Cervical back: Normal range of motion.  Skin:    General: Skin is warm and dry.  Neurological:     Mental Status: She is alert and oriented to person, place, and time.     Deep Tendon Reflexes: Reflexes abnormal.  Psychiatric:        Mood and Affect: Mood normal.        Speech: Speech normal.        Behavior: Behavior normal.    Results for orders placed or performed during the hospital encounter of 10/25/20 (from the past 24 hour(s))  CBC     Status: Abnormal   Collection Time: 10/25/20 10:44 PM  Result Value Ref Range   WBC 12.1 (H) 4.0 - 10.5 K/uL   RBC 2.70 (L) 3.87 - 5.11 MIL/uL   Hemoglobin 8.1 (L) 12.0 - 15.0 g/dL   HCT 10/27/20 (L) 75.4 - 36.0 %   MCV 93.7 80.0 - 100.0 fL   MCH 30.0 26.0 - 34.0 pg   MCHC 32.0 30.0 - 36.0 g/dL   RDW 67.7 03.4 - 03.5 %   Platelets 234 150 - 400 K/uL   nRBC 0.3 (H) 0.0 - 0.2 %  Comprehensive metabolic panel     Status: Abnormal   Collection Time: 10/25/20 10:44 PM  Result Value Ref Range   Sodium 134 (L) 135 - 145 mmol/L   Potassium 3.2 (L) 3.5 - 5.1 mmol/L   Chloride 105 98 - 111 mmol/L   CO2 22 22 - 32 mmol/L   Glucose, Bld 90 70 - 99 mg/dL   BUN 9 6 - 20 mg/dL   Creatinine, Ser 10/27/20 0.44 - 1.00 mg/dL    Calcium 8.1 (L) 8.9 - 10.3 mg/dL   Total Protein 6.5 6.5 - 8.1 g/dL   Albumin 3.4 (L) 3.5 - 5.0 g/dL   AST 26 15 - 41 U/L   ALT 27 0 - 44 U/L   Alkaline Phosphatase 97 38 - 126 U/L   Total Bilirubin 2.1 (H) 0.3 -  1.2 mg/dL   GFR, Estimated >71 >24 mL/min   Anion gap 7 5 - 15  Protein / creatinine ratio, urine     Status: None   Collection Time: 10/25/20 10:44 PM  Result Value Ref Range   Creatinine, Urine 113.44 mg/dL   Total Protein, Urine 11 mg/dL   Protein Creatinine Ratio 0.10 0.00 - 0.15 mg/mg[Cre]  Urinalysis, Routine w reflex microscopic     Status: Abnormal   Collection Time: 10/25/20 10:44 PM  Result Value Ref Range   Color, Urine YELLOW YELLOW   APPearance CLEAR CLEAR   Specific Gravity, Urine 1.013 1.005 - 1.030   pH 5.0 5.0 - 8.0   Glucose, UA NEGATIVE NEGATIVE mg/dL   Hgb urine dipstick MODERATE (A) NEGATIVE   Bilirubin Urine NEGATIVE NEGATIVE   Ketones, ur 5 (A) NEGATIVE mg/dL   Protein, ur NEGATIVE NEGATIVE mg/dL   Nitrite NEGATIVE NEGATIVE   Leukocytes,Ua TRACE (A) NEGATIVE   RBC / HPF 6-10 0 - 5 RBC/hpf   WBC, UA 6-10 0 - 5 WBC/hpf   Bacteria, UA NONE SEEN NONE SEEN   Squamous Epithelial / LPF 0-5 0 - 5   Mucus PRESENT   Brain natriuretic peptide     Status: Abnormal   Collection Time: 10/25/20 10:45 PM  Result Value Ref Range   B Natriuretic Peptide 181.5 (H) 0.0 - 100.0 pg/mL    DG Chest Portable 1 View  Result Date: 10/25/2020 CLINICAL DATA:  Short of breath EXAM: PORTABLE CHEST 1 VIEW COMPARISON:  None. FINDINGS: Single frontal view of the chest demonstrates mild enlargement the cardiac silhouette. There is central vascular congestion with bilateral perihilar airspace disease most consistent with edema. No effusion or pneumothorax. IMPRESSION: 1. Bilateral perihilar airspace disease consistent with edema. Electronically Signed   By: Sharlet Salina M.D.   On: 10/25/2020 23:17    Assessment/Plan: 15 days s/p cesarean section with SOB and headache.   Will treat for preeclampsia however postpartum cardiomyopathy should be ruled out given the late and atypical presentation.  - magnesium for seizure prophylaxis  - serial blood pressure checks  - strict I&O - consider cardiology consult to evaluate for postpartum cardiomyopathy  H/O drug addiction - continue subutex   Gerald Leitz 10/26/2020, 12:58 AM

## 2020-10-26 NOTE — Progress Notes (Signed)
In to see patient for today. She feels much better than she did this morning. Diuresing and just voided. Her husband has been coming to take breast milk to her baby. We reviewed today's findings/diagnosis. Appreciate Cardiology recommendations.  Steva Ready, DO

## 2020-10-26 NOTE — Progress Notes (Signed)
Echocardiogram 2D Echocardiogram has been performed.  Warren Lacy Brittanya Winburn RDCS 10/26/2020, 3:14 PM

## 2020-10-26 NOTE — Consult Note (Signed)
Cardiology Consultation:   Patient ID: Abigail Fox; [id number]; 1985-08-30   Admit date: 10/25/2020 Date of Consult: 10/26/2020  Primary Care Provider: Wilfrid Lund, PA Primary Cardiologist: New to [facility]   Patient Profile:   Abigail Fox is a 35 y.o. 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 Richardson Dopp.  History of Present Illness:   Abigail Fox is a 34yo with a hx as stated above who presented back to [facility] 10/26/20 with symptoms of SOB that began 4 days prior to admission. She reports that her delivery and post partum hospitalization was relatively uneventful. She was discharged home and reports that she was never able to sleep in her bed due to what sounds like orthopnea symptoms. She states that tasks in her home which were previously easy for her like laundry or cleaning had become more difficult due to SOB and LE edema. She initially felt her symptoms were related to her surgery (c-section) and that they would improved with healing. Prior to admission she states that her symptoms became acutely worse. Given this, she presented to MAU and received IV Lasix with improvement. SpO2 noted to be in the 80's. CXR with imaging consistent with pulmonary edema. Echocardiogram has been scheduled however has not yet been performed. K+ found to be low at 3.2>>supplemented per primary team. BNP up at 181. Hb noted to be 8.1>>down from a baseline at 10-11. Creatinine stable at 0.85.   Past Medical History:  Diagnosis Date   Anxiety    Drug addiction (HCC)    subutex   Hepatitis    hepatitis C   Kidney stones    MRSA infection 2010,2013   Ovarian cyst     Past Surgical History:  Procedure Laterality Date   CESAREAN SECTION N/A 06/08/2017   Procedure: CESAREAN SECTION;  Surgeon: Geryl Rankins,  MD;  Location: [facility] BIRTHING SUITES;  Service: Obstetrics;  Laterality: N/A;   CESAREAN SECTION Bilateral 10/11/2020   Procedure: REPEAT CESAREAN SECTION WITH BILATERAL SALPINGECTOMY.;  Surgeon: Steva Ready, DO;  Location: [facility] LD ORS;  Service: Obstetrics;  Laterality: Bilateral;   CHOLECYSTECTOMY N/A 01/15/2018   Procedure: LAPAROSCOPIC CHOLECYSTECTOMY WITH INTRAOPERATIVE CHOLANGIOGRAM ERAS PATHWAY;  Surgeon: Gaynelle Adu, MD;  Location: [facility] ORS;  Service: General;  Laterality: N/A;   DILATION AND EVACUATION N/A 05/14/2016   Procedure: DILATATION AND EVACUATION (D&E) 2ND TRIMESTER WITH ULTRA SOUND GUIDANCE;  Surgeon: Tereso Newcomer, MD;  Location: [facility] ORS;  Service: Gynecology;  Laterality: N/A;   miscarriage     TOOTH EXTRACTION       Prior to Admission medications   Medication Sig Start Date End Date Taking? Authorizing Provider  acetaminophen (TYLENOL) 500 MG tablet Take 500 mg by mouth every 6 (six) hours as needed.   Yes [provider]  buprenorphine (SUBUTEX) 8 MG SUBL SL tablet Place 8 mg under the tongue 2 (two) times daily. 10/23/16  Yes [provider]  ibuprofen (ADVIL) 800 MG tablet Take 1 tablet (800 mg total) by mouth every 8 (eight) hours as needed. 10/14/20  Yes Steva Ready, DO  Prenatal Vit-Fe Fumarate-FA (PRENATAL MULTIVITAMIN) TABS tablet Take 1 tablet by mouth daily at 12 noon.   Yes [provider]  valACYclovir (VALTREX) 500 MG tablet Take 500 mg by mouth 2 (two) times daily. 12/15/17  Yes [provider]  Doxylamine-Pyridoxine 10-10 MG TBEC Take 1-2 tablets by mouth See admin instructions. 2 tabs in the morning, 2 tabs in the afternnoon, and 1 tablet at bedtime    [provider]  famotidine (PEPCID) 20 MG tablet Take 1 tablet (20 mg total) by mouth 2 (two) times daily. 08/08/20 08/08/21  Sheila Oats, MD  ferrous sulfate 325 (65 FE) MG tablet Take 325 mg by mouth 2 (two) times daily with a meal.    [provider]   oxyCODONE (OXY IR/ROXICODONE) 5 MG immediate release tablet Take 1-2 tablets (5-10 mg total) by mouth every 4 (four) hours as needed for moderate pain. 10/14/20   Steva Ready, DO    Inpatient Medications: Scheduled Meds:  buprenorphine  8 mg Sublingual BID   mouth rinse  15 mL Mouth Rinse BID   potassium chloride  20 mEq Oral BID   prenatal multivitamin  1 tablet Oral q morning   valACYclovir  500 mg Oral Daily   Continuous Infusions:  lactated ringers 50 mL/hr at 10/26/20 0136   magnesium sulfate 2 g/hr (10/26/20 0200)   PRN Meds: butalbital-acetaminophen-caffeine, guaiFENesin, hydrALAZINE **AND** hydrALAZINE **AND** labetalol **AND** labetalol **AND** Measure blood pressure, ondansetron  Allergies:   No Known Allergies  Social History:   Social History   Socioeconomic History   Marital status: Married    Spouse name: Not on file   Number of children: 1   Years of education: Not on file   Highest education level: Not on file  Occupational History   Occupation: customer srevice  Tobacco Use   Smoking status: Never   Smokeless tobacco: Never  Vaping Use   Vaping Use: Never used  Substance and Sexual Activity   Alcohol use: No   Drug use: Not Currently    Types: Marijuana    Comment: February 15, 2020   Sexual activity: Yes    Birth control/protection: None  Other Topics Concern   Not on file  Social History Narrative   Not on file   Social Determinants of Health   Financial Resource Strain: Not on file  Food Insecurity: Not on file  Transportation Needs: Not on file  Physical Activity: Not on file  Stress: Not on file  Social Connections: Not on file  Intimate Partner Violence: Not on file    Family History:   Family History  Problem Relation Age of Onset   Diabetes Mother    Hypertension Mother    Heart disease Maternal Grandfather    Kidney cancer Maternal Grandmother    Lung cancer Paternal Grandmother    Family Status:  Family Status   Relation Name Status   Mother  Alive   Father  Alive   MGF  (Not Specified)   MGM  (Not Specified)   PGM  Deceased    ROS:  Please see the history of present illness.  All other ROS reviewed and negative.     Physical Exam/Data:   Vitals:   10/26/20 0753 10/26/20 0800 10/26/20 1000 10/26/20 1100  BP:  129/77 (!) 141/74   Pulse:  82 75   Resp:  20 18 18   Temp:      TempSrc:  Oral    SpO2: 92% 92% 95% 99%  Weight:      Height:        Intake/Output Summary (Last 24 hours) at 10/26/2020 1137 Last data filed at 10/26/2020 1100 Gross per 24 hour  Intake 1699.13 ml  Output  3150 ml  Net -1450.87 ml   Filed Weights   10/25/20 2155  Weight: 121.1 kg   Body mass index is 45.83 kg/m.   General: Well developed, well nourished, NAD Neck: Negative for carotid bruits. No JVD Lungs: Diminished in bilateral lobes. No wheezes, rales, or rhonchi. Breathing is unlabored. Cardiovascular: RRR with S1 S2. No murmurs Abdomen: Soft, non-tender, non-distended. No obvious abdominal masses. Extremities: 2+ BLE  Neuro: Alert and oriented.  Psych: Responds to questions appropriately with normal affect.    Telemetry:  Telemetry was personally reviewed and demonstrates: Not on telemetry   Relevant CV Studies:  ECHO: Pending   Laboratory Data:  Chemistry Recent Labs  Lab 10/25/20 2244  NA 134*  K 3.2*  CL 105  CO2 22  GLUCOSE 90  BUN 9  CREATININE 0.85  CALCIUM 8.1*  GFRNONAA >60  ANIONGAP 7    Total Protein  Date Value Ref Range Status  10/25/2020 6.5 6.5 - 8.1 g/dL Final   Albumin  Date Value Ref Range Status  10/25/2020 3.4 (L) 3.5 - 5.0 g/dL Final   AST  Date Value Ref Range Status  10/25/2020 26 15 - 41 U/L Final   ALT  Date Value Ref Range Status  10/25/2020 27 0 - 44 U/L Final   Alkaline Phosphatase  Date Value Ref Range Status  10/25/2020 97 38 - 126 U/L Final   Total Bilirubin  Date Value Ref Range Status  10/25/2020 2.1 (H) 0.3 - 1.2 mg/dL Final    Hematology Recent Labs  Lab 10/25/20 2244  WBC 12.1*  RBC 2.70*  HGB 8.1*  HCT 25.3*  MCV 93.7  MCH 30.0  MCHC 32.0  RDW 14.6  PLT 234   Cardiac EnzymesNo results for input(s): TROPONINI in the last 168 hours. No results for input(s): TROPIPOC in the last 168 hours.  BNP Recent Labs  Lab 10/25/20 2245  BNP 181.5*    DDimer No results for input(s): DDIMER in the last 168 hours. TSH:  Lab Results  Component Value Date   TSH 0.816 05/14/2016   Lipids:No results found for: CHOL, HDL, LDLCALC, LDLDIRECT, TRIG, CHOLHDL HgbA1c: Lab Results  Component Value Date   HGBA1C 5.0 05/14/2016    Radiology/Studies:  DG Chest Portable 1 View  Result Date: 10/25/2020 CLINICAL DATA:  Short of breath EXAM: PORTABLE CHEST 1 VIEW COMPARISON:  None. FINDINGS: Single frontal view of the chest demonstrates mild enlargement the cardiac silhouette. There is central vascular congestion with bilateral perihilar airspace disease most consistent with edema. No effusion or pneumothorax. IMPRESSION: 1. Bilateral perihilar airspace disease consistent with edema. Electronically Signed   By: Sharlet SalinaMichael  Brown M.D.   On: 10/25/2020 23:17    Assessment and Plan:   1. Acute diastolic HF -[facility] with symptoms of SOB that began 4 days prior to admission. She reports that her delivery and post partum hospitalization was relatively uneventful>>she reports post discharge orthopnea and worsening LE edema and eventual progression to SOB/DOE. Tasks which were previously easy for her like laundry or cleaning had become more difficult due to SOB and LE edema.  -Presented to MAU and received IV Lasix with improvement.  -SpO2 noted to be in the 80's.  -CXR with imaging consistent with pulmonary edema.  -Echocardiogram EF 55-60%  -Creatinine stable at 0.85.  -Due to breastfeeding, we are somewhat limited with are GDMT options. Discussed case with pharmD who recommends enalapril or benazepril as  these are considered safe with breastfeeding.  -  BP 127/92>>141/74>>129/77 -Will increase Lasix to 40mg  BID until more fluid volume balance then can transition to PO Lasix at d/c  -Can add carvedilol once euvolemic  -Once patient no longer breast feeding, will certainly consider transition to [facility]   For questions or updates, please contact [facility] Please consult [email or web address] for contact info under Cardiology/STEMI.   [facility] NP-C [facility] Pager: [phone] 10/26/2020 11:37 AM  Patient seen and examined with the above-signed Advanced Practice Provider and/or Housestaff. I personally reviewed laboratory data, imaging studies and relevant notes. I independently examined the patient and formulated the important aspects of the plan. I have edited the note to reflect any of my changes or salient points. I have personally discussed the plan with the patient and/or family.  35 y/o female SW at 36 s/p C-section with 2nd child on 10/11/20 now admitted with acute HF.   Denies being hypertensive throughout pregnancy.   CXR with pulmonary edema. Echo with EF 55-60% normal diastolic parameters.   General:  Sitting up on side of bed No resp difficulty HEENT: normal Neck: supple. JVP elevated. Carotids 2+ bilat; no bruits. No lymphadenopathy or thryomegaly appreciated. Cor: PMI nondisplaced. Regular rate & rhythm. No rubs, gallops or murmurs. Lungs: faint crackles Abdomen: obese soft, nontender, nondistended. No hepatosplenomegaly. No bruits or masses. Good bowel sounds. Extremities: no cyanosis, clubbing, rash, 2+ edema Neuro: alert & orientedx3, cranial nerves grossly intact. moves all 4 extremities w/o difficulty. Affect pleasant  She has acute diastolic HF. Echo looks normal. Agree with lasix 40IV bid. Control BP with enalapril for now. Place TED hose. Hopefully she will feel better quickly.  We will see again in am.   12/12/20, MD  5:59 PM

## 2020-10-26 NOTE — Progress Notes (Signed)
Postpartum Progress Note  Subjective: Patient does not feel well overall. Reports headache that Tylenol did not help. Ambulating and voiding without issue. Still having some SOB. Denies fevers, chills, chest pain, or N/V.  Objective: BP 129/77 (BP Location: Right Arm)   Pulse 82   Temp 97.6 F (36.4 C) (Oral)   Resp 20   Ht 5\' 4"  (1.626 m)   Wt 121.1 kg   SpO2 92%   Breastfeeding Yes   BMI 45.83 kg/m  Gen:  NAD, pleasant and cooperative Cardio:  RRR Pulm:  CTAB, no wheezes/rales/rhonchi, lung sounds overall diminished R>L Abd:  Soft, non-distended, non-tender throughout, no rebound/guarding Ext:  2+ bilateral LE pitting edema, no bilateral calf tenderness Neuro: 3+ biceps DTRs  UOP: 400-700cc/hour  Results for orders placed or performed during the hospital encounter of 10/25/20  Resp Panel by RT-PCR (Flu A&B, Covid) Nasopharyngeal Swab   Specimen: Nasopharyngeal Swab; Nasopharyngeal(NP) swabs in vial transport medium  Result Value Ref Range   SARS Coronavirus 2 by RT PCR NEGATIVE NEGATIVE   Influenza A by PCR NEGATIVE NEGATIVE   Influenza B by PCR NEGATIVE NEGATIVE  CBC  Result Value Ref Range   WBC 12.1 (H) 4.0 - 10.5 K/uL   RBC 2.70 (L) 3.87 - 5.11 MIL/uL   Hemoglobin 8.1 (L) 12.0 - 15.0 g/dL   HCT 10/27/20 (L) 65.6 - 81.2 %   MCV 93.7 80.0 - 100.0 fL   MCH 30.0 26.0 - 34.0 pg   MCHC 32.0 30.0 - 36.0 g/dL   RDW 75.1 70.0 - 17.4 %   Platelets 234 150 - 400 K/uL   nRBC 0.3 (H) 0.0 - 0.2 %  Comprehensive metabolic panel  Result Value Ref Range   Sodium 134 (L) 135 - 145 mmol/L   Potassium 3.2 (L) 3.5 - 5.1 mmol/L   Chloride 105 98 - 111 mmol/L   CO2 22 22 - 32 mmol/L   Glucose, Bld 90 70 - 99 mg/dL   BUN 9 6 - 20 mg/dL   Creatinine, Ser 94.4 0.44 - 1.00 mg/dL   Calcium 8.1 (L) 8.9 - 10.3 mg/dL   Total Protein 6.5 6.5 - 8.1 g/dL   Albumin 3.4 (L) 3.5 - 5.0 g/dL   AST 26 15 - 41 U/L   ALT 27 0 - 44 U/L   Alkaline Phosphatase 97 38 - 126 U/L   Total Bilirubin 2.1  (H) 0.3 - 1.2 mg/dL   GFR, Estimated 9.67 >59 mL/min   Anion gap 7 5 - 15  Protein / creatinine ratio, urine  Result Value Ref Range   Creatinine, Urine 113.44 mg/dL   Total Protein, Urine 11 mg/dL   Protein Creatinine Ratio 0.10 0.00 - 0.15 mg/mg[Cre]  Urinalysis, Routine w reflex microscopic  Result Value Ref Range   Color, Urine YELLOW YELLOW   APPearance CLEAR CLEAR   Specific Gravity, Urine 1.013 1.005 - 1.030   pH 5.0 5.0 - 8.0   Glucose, UA NEGATIVE NEGATIVE mg/dL   Hgb urine dipstick MODERATE (A) NEGATIVE   Bilirubin Urine NEGATIVE NEGATIVE   Ketones, ur 5 (A) NEGATIVE mg/dL   Protein, ur NEGATIVE NEGATIVE mg/dL   Nitrite NEGATIVE NEGATIVE   Leukocytes,Ua TRACE (A) NEGATIVE   RBC / HPF 6-10 0 - 5 RBC/hpf   WBC, UA 6-10 0 - 5 WBC/hpf   Bacteria, UA NONE SEEN NONE SEEN   Squamous Epithelial / LPF 0-5 0 - 5   Mucus PRESENT   Brain natriuretic peptide  Result  Value Ref Range   B Natriuretic Peptide 181.5 (H) 0.0 - 100.0 pg/mL   A/P: G4P2 s/p LTCS and bilateral salpingectomy on 10/11/20 with history of postpartum preeclampsia who is readmitted for pulmonary edema.  - Suspect postpartum preeclampsia with SF (based on headache and mild range Bps) vs peripartum cardiomyopathy  - Preeclampsia labs normal, P/C ratio 0.10 - Magnesium sulfate ordered for seizure prophylaxis x 24h - Blood pressures normal to very low mild range - Continue vitals q2h and strict I&O - CXR on 7/20 reveals bilateral perihilar airspace disease consistent with edema, s/p Lasix 60mg  total with diruesis - O2 saturation son arrival were in the 80s, currently 92% on 6L [place]  - EKG borderline based on preliminary read but normal sinus rhythm - Echocardiogram ordered to evaluate cardiac function - Cariology consulted to evaluate for peripartum cardiomyopathy  07-26-1986, DO

## 2020-10-27 ENCOUNTER — Other Ambulatory Visit (HOSPITAL_COMMUNITY): Payer: Self-pay

## 2020-10-27 LAB — BASIC METABOLIC PANEL
Anion gap: 10 (ref 5–15)
BUN: 6 mg/dL (ref 6–20)
CO2: 32 mmol/L (ref 22–32)
Calcium: 7.5 mg/dL — ABNORMAL LOW (ref 8.9–10.3)
Chloride: 96 mmol/L — ABNORMAL LOW (ref 98–111)
Creatinine, Ser: 0.86 mg/dL (ref 0.44–1.00)
GFR, Estimated: >60 mL/min (ref >60)
Glucose, Bld: 105 mg/dL — ABNORMAL HIGH (ref 70–99)
Potassium: 3.1 mmol/L — ABNORMAL LOW (ref 3.5–5.1)
Sodium: 138 mmol/L (ref 135–145)

## 2020-10-27 MED ORDER — BENAZEPRIL HCL 5 MG PO TABS
5.0000 mg | ORAL_TABLET | Freq: Every day | ORAL | Status: DC
  Administered 2020-10-28: 5 mg via ORAL
  Filled 2020-10-27: qty 1

## 2020-10-27 NOTE — Progress Notes (Signed)
Abigail Fox is a 35 y.o. female  admitted with SOB diagnosed with acute diastolic heart failure.   Subjective: pt reports sob with ambulating to the bathroom and lying flat. She would like to be able to ambulate more. She denies headache, visual disturbances ruq pain.   Vitals:   10/27/20 1610 10/27/20 1700 10/27/20 1745 10/27/20 1844  BP: 110/74   127/78  Pulse: 69   64  Resp: 19   17  Temp: 98.1 F (36.7 C)   98.2 F (36.8 C)  TempSrc: Oral   Oral  SpO2:  100% 100% 100%  Weight:      Height:        General: Alert and oriented no acute distress. On 15 liters of O2 via nasal canula CV regular rate and rhythm  Lungs: clear to auscultation bilaterally no rhonchi. Good air movement  Abdomen soft nontender nondistended  Extremities Ted hose in place no sign of DVT  Results for orders placed or performed during the hospital encounter of 10/25/20 (from the past 24 hour(s))  Basic metabolic panel     Status: Abnormal   Collection Time: 10/27/20  7:08 AM  Result Value Ref Range   Sodium 138 135 - 145 mmol/L   Potassium 3.1 (L) 3.5 - 5.1 mmol/L   Chloride 96 (L) 98 - 111 mmol/L   CO2 32 22 - 32 mmol/L   Glucose, Bld 105 (H) 70 - 99 mg/dL   BUN 6 6 - 20 mg/dL   Creatinine, Ser 5.17 0.44 - 1.00 mg/dL   Calcium 7.5 (L) 8.9 - 10.3 mg/dL   GFR, Estimated >00 >17 mL/min   Anion gap 10 5 - 15    A/P HD #3  postpartum Acute diastolic heart failure  Managed by cardiology  - lasix 40 mg IV bid  - enalapril bid  - attempt to wean O2 to keep sats above 93%  Hypokalemia  - Kdur Po

## 2020-10-27 NOTE — TOC Initial Note (Addendum)
Transition of Care [facility]) - Initial/Assessment Note    Patient Details  Name: Abigail Fox MRN: [id number] Date of Birth: 04-25-1985  Transition of [facility]) CM/SW Contact:    Elliot Cousin, RN Phone Number: [phone] 10/27/2020, 3:54 PM  Clinical Narrative:                  TOC CM spoke to pt and states she lives at home with husband. She has applied for Medicaid but does not feel she will quality due to husband's salary. He will seek to have her added to her plan since birth of child pt may qualify to be added. Her insurance should be active until 11/05/2020, BC[employer]. Sent message to HF Pharmacy team to run test prescriptions.   Her meds are under $10 for all three home meds.     Expected Discharge Plan: Home/Self Care Barriers to Discharge: Continued Medical Work up   Patient Goals and CMS Choice Patient states their goals for this hospitalization and ongoing recovery are:: to get well      Expected Discharge Plan and Services Expected Discharge Plan: Home/Self Care In-house Referral: Clinical Social Work Discharge Planning Services: CM Consult, Medication Assistance   Living arrangements for the past 2 months: Single Family Home                                      Prior Living Arrangements/Services Living arrangements for the past 2 months: Single Family Home Lives with:: Spouse Patient language and need for interpreter reviewed:: No        Need for Family Participation in Patient Care: Yes (Comment) Care giver support system in place?: Yes (comment)   Criminal Activity/Legal Involvement Pertinent to Current Situation/Hospitalization: No - Comment as needed  Activities of Daily Living Home Assistive Devices/Equipment: None ADL Screening (condition at time of admission) Patient's cognitive ability adequate to safely complete daily activities?: Yes Is the patient deaf or have difficulty hearing?: No Does the patient have difficulty seeing, even  when wearing glasses/contacts?: No Does the patient have difficulty concentrating, remembering, or making decisions?: No Patient able to express need for assistance with ADLs?: Yes Does the patient have difficulty dressing or bathing?: No Independently performs ADLs?: Yes (appropriate for developmental age) Does the patient have difficulty walking or climbing stairs?: No Weakness of Legs: None Weakness of Arms/Hands: None  Permission Sought/Granted Permission sought to share information with : Case Manager, PCP Permission granted to share information with : Yes, Verbal Permission Granted  Share Information with NAME: Abigail Fox  Permission granted to share info w AGENCY: PCP  Permission granted to share info w Relationship: husband  Permission granted to share info w Contact Information: [phone]  Emotional Assessment       Orientation: : Oriented to Self, Oriented to Place, Oriented to  Time, Oriented to Situation   Psych Involvement: No (comment)  Admission diagnosis:  Preeclampsia in postpartum period [O14.95] Patient Active Problem List   Diagnosis Date Noted   History of cesarean delivery 10/11/2020   GBS (group b Streptococcus) UTI complicating pregnancy, third trimester 02/25/2020   Preeclampsia in postpartum period 06/27/2017   Pregnancy 06/07/2017   Pregnancy with 30 completed weeks gestation    Abdominal pain complicating pregnancy 01/20/2017   Pregnancy complicated by subutex maintenance, antepartum (HCC) 04/11/2016   Marijuana use 04/11/2016   PCP:  Wilfrid Lund, PA Pharmacy:  [id number] [place], [place] - [phone] AT [facility] [address] [place] [address] Phone: [phone] Fax: [phone]     Social Determinants of Health (SDOH) Interventions    Readmission Risk Interventions No flowsheet data found.

## 2020-10-27 NOTE — Progress Notes (Addendum)
Advanced Heart Failure Rounding Note  PCP-Cardiologist: None    Patient Profile   35 y/o female SW at [place] s/p C-section with 2nd child on 10/11/20 now admitted with acute HF. Denies being hypertensive throughout pregnancy. CXR with pulmonary edema. Echo with EF 55-60% normal diastolic parameters.     Subjective:    Responding well to IV Lasix, -6L in UOP yesterday. Daily wts not being recorded. Feels better but remains fluid overloaded.   SBPs 140-150s.    Objective:   Weight Range: 121.1 kg Body mass index is 45.83 kg/m.   Vital Signs:   Temp:  [97.7 F (36.5 C)-99 F (37.2 C)] 98 F (36.7 C) (07/22 0900) Pulse Rate:  [69-85] 83 (07/22 0900) Resp:  [18-22] 19 (07/22 0900) BP: (124-151)/(70-92) 141/85 (07/22 0900) SpO2:  [75 %-99 %] 96 % (07/22 0900)    Weight change: Filed Weights   10/25/20 2155  Weight: 121.1 kg    Intake/Output:   Intake/Output Summary (Last 24 hours) at 10/27/2020 1052 Last data filed at 10/27/2020 1000 Gross per 24 hour  Intake 4138.3 ml  Output 6775 ml  Net -2636.7 ml      Physical Exam    General:  Well appearing. No resp difficulty HEENT: Normal Neck: Supple. JVP elevated to jaw . Carotids 2+ bilat; no bruits. No lymphadenopathy or thyromegaly appreciated. Cor: PMI nondisplaced. Regular rate & rhythm. No rubs, gallops or murmurs. Lungs: Clear Abdomen: Soft, nontender, nondistended. No hepatosplenomegaly. No bruits or masses. Good bowel sounds. Extremities: No cyanosis, clubbing, rash, 2+ bilateral LEE edema Neuro: Alert & orientedx3, cranial nerves grossly intact. moves all 4 extremities w/o difficulty. Affect pleasant   Telemetry   N/A   EKG    No new EKG to review   Labs    CBC Recent Labs    10/25/20 2244  WBC 12.1*  HGB 8.1*  HCT 25.3*  MCV 93.7  PLT 234   Basic Metabolic Panel Recent Labs    05/69/79 2244 10/27/20 0708  NA 134* 138  K 3.2* 3.1*  CL 105 96*  CO2 22 32  GLUCOSE 90 105*  BUN  9 6  CREATININE 0.85 0.86  CALCIUM 8.1* 7.5*   Liver Function Tests Recent Labs    10/25/20 2244  AST 26  ALT 27  ALKPHOS 97  BILITOT 2.1*  PROT 6.5  ALBUMIN 3.4*   No results for input(s): LIPASE, AMYLASE in the last 72 hours. Cardiac Enzymes No results for input(s): CKTOTAL, CKMB, CKMBINDEX, TROPONINI in the last 72 hours.  BNP: BNP (last 3 results) Recent Labs    10/25/20 2245  BNP 181.5*    ProBNP (last 3 results) No results for input(s): PROBNP in the last 8760 hours.   D-Dimer No results for input(s): DDIMER in the last 72 hours. Hemoglobin A1C No results for input(s): HGBA1C in the last 72 hours. Fasting Lipid Panel No results for input(s): CHOL, HDL, LDLCALC, TRIG, CHOLHDL, LDLDIRECT in the last 72 hours. Thyroid Function Tests No results for input(s): TSH, T4TOTAL, T3FREE, THYROIDAB in the last 72 hours.  Invalid input(s): FREET3  Other results:   Imaging    ECHOCARDIOGRAM COMPLETE  Result Date: 10/26/2020    ECHOCARDIOGRAM REPORT   Patient Name:   Abigail Fox Date of Exam: 10/26/2020 Medical Rec #:  [id number]      Height:       64.0 in Accession #:    [id number]     Weight:  267.0 lb Date of Birth:  1985-11-25      BSA:          2.212 m Patient Age:    34 years       BP:           139/81 mmHg Patient Gender: F              HR:           76 bpm. Exam Location:  Inpatient Procedure: 2D Echo, Color Doppler and Cardiac Doppler Indications:    Pulmonary Edema  History:        Patient has no prior history of Echocardiogram examinations.  Sonographer:    Irving Burton Senior RDCS Referring Phys: [id number] MELISSA DAVIES  Sonographer Comments: 15 days post-partum IMPRESSIONS  1. Left ventricular ejection fraction, by estimation, is 55 to 60%. The left ventricle has normal function. The left ventricle has no regional wall motion abnormalities. Left ventricular diastolic parameters were normal.  2. Right ventricular systolic function is normal. The right ventricular  size is normal. Tricuspid regurgitation signal is inadequate for assessing PA pressure.  3. The mitral valve is grossly normal. No evidence of mitral valve regurgitation. No evidence of mitral stenosis.  4. The aortic valve was not well visualized. Aortic valve regurgitation is not visualized. No aortic stenosis is present.  5. The inferior vena cava is normal in size with greater than 50% respiratory variability, suggesting right atrial pressure of 3 mmHg. FINDINGS  Left Ventricle: Left ventricular ejection fraction, by estimation, is 55 to 60%. The left ventricle has normal function. The left ventricle has no regional wall motion abnormalities. The left ventricular internal cavity size was normal in size. There is  no left ventricular hypertrophy. Left ventricular diastolic parameters were normal. Right Ventricle: The right ventricular size is normal. No increase in right ventricular wall thickness. Right ventricular systolic function is normal. Tricuspid regurgitation signal is inadequate for assessing PA pressure. Left Atrium: Left atrial size was normal in size. Right Atrium: Right atrial size was normal in size. Pericardium: There is no evidence of pericardial effusion. Mitral Valve: The mitral valve is grossly normal. Mild mitral annular calcification. No evidence of mitral valve regurgitation. No evidence of mitral valve stenosis. Tricuspid Valve: The tricuspid valve is normal in structure. Tricuspid valve regurgitation is not demonstrated. No evidence of tricuspid stenosis. Aortic Valve: The aortic valve was not well visualized. Aortic valve regurgitation is not visualized. No aortic stenosis is present. Pulmonic Valve: The pulmonic valve was not well visualized. Pulmonic valve regurgitation is not visualized. No evidence of pulmonic stenosis. Aorta: The aortic root and ascending aorta are structurally normal, with no evidence of dilitation. Venous: The inferior vena cava is normal in size with greater than  50% respiratory variability, suggesting right atrial pressure of 3 mmHg. IAS/Shunts: The atrial septum is grossly normal.  LEFT VENTRICLE PLAX 2D LVIDd:         5.30 cm  Diastology LVIDs:         3.50 cm  LV e' medial:    9.57 cm/s LV PW:         1.10 cm  LV E/e' medial:  14.8 LV IVS:        0.90 cm  LV e' lateral:   17.70 cm/s LVOT diam:     1.90 cm  LV E/e' lateral: 8.0 LV SV:         53 LV SV Index:   24 LVOT Area:  2.84 cm  RIGHT VENTRICLE RV S prime:     14.00 cm/s TAPSE (M-mode): 2.1 cm LEFT ATRIUM             Index       RIGHT ATRIUM           Index LA diam:        4.90 cm 2.22 cm/m  RA Area:     16.00 cm LA Vol (A2C):   55.8 ml 25.23 ml/m RA Volume:   40.60 ml  18.35 ml/m LA Vol (A4C):   60.8 ml 27.49 ml/m LA Biplane Vol: 58.5 ml 26.45 ml/m  AORTIC VALVE LVOT Vmax:   100.00 cm/s LVOT Vmean:  71.300 cm/s LVOT VTI:    0.186 m  AORTA Ao Root diam: 2.60 cm Ao Asc diam:  3.40 cm MITRAL VALVE MV Area (PHT): 4.33 cm     SHUNTS MV Decel Time: 175 msec     Systemic VTI:  0.19 m MV E velocity: 142.00 cm/s  Systemic Diam: 1.90 cm MV A velocity: 81.30 cm/s MV E/A ratio:  1.75 Riley Lam MD Electronically signed by Riley Lam MD Signature Date/Time: 10/26/2020/4:36:03 PM    Final      Medications:     Scheduled Medications:  buprenorphine  8 mg Sublingual BID   enalapril  2.5 mg Oral BID   furosemide  40 mg Intravenous BID   mouth rinse  15 mL Mouth Rinse BID   potassium chloride  20 mEq Oral BID   prenatal multivitamin  1 tablet Oral q morning   valACYclovir  500 mg Oral Daily    Infusions:  lactated ringers Stopped (10/27/20 0145)    PRN Medications: butalbital-acetaminophen-caffeine, guaiFENesin, hydrALAZINE **AND** hydrALAZINE **AND** labetalol **AND** labetalol **AND** Measure blood pressure, ondansetron    Patient Profile   35 y/o female SW at [place] s/p C-section with 2nd child on 10/11/20 now admitted with acute HF. Denies being hypertensive throughout  pregnancy. CXR with pulmonary edema. Echo with EF 55-60% normal diastolic parameters.     Assessment/Plan   Acute Diastolic Heart Failure - post partum, s/p C-section on 10/11/20 - denies HTN during pregnancy. SBPs this admit 140s-150s - CXR w/ pulmonary edema  - Echo EF 55-60% w/ normal diastolic parameters  - Good response to IV Lasix w/ 6L in UOP yesterday, Renal fx stable   - Continue IV Lasix today, 40 mg bid and supp K  - C/w enalapril 2.5 bid (plan to switch to benazepril tomorrow for once daily dosing) - once fully diuresed w/ IV Lasix, will add HCTZ   2. Hypokalemia - K 3.1 - Supp w/ diuresis   Will see again tomorrow  Length of Stay: 1  Robbie Lis, PA-C  10/27/2020, 10:52 AM  Advanced Heart Failure Team Pager [phone] (M-F; 7a - 5p)  Please contact [facility] for night-coverage after hours (5p -7a ) and weekends on [email or web address]  Patient seen and examined with the above-signed Advanced Practice Provider and/or Housestaff. I personally reviewed laboratory data, imaging studies and relevant notes. I independently examined the patient and formulated the important aspects of the plan. I have edited the note to reflect any of my changes or salient points. I have personally discussed the plan with the patient and/or family.  Volume status improving but still overloaded. Echo normal. Denies dyspnea, orthopnea or PND. + edema  General:  Well appearing. No resp difficulty HEENT: normal Neck: supple. JVP 7. Carotids 2+ bilat; no bruits. No lymphadenopathy or thryomegaly  appreciated. Cor: PMI nondisplaced. Regular rate & rhythm. No rubs, gallops or murmurs. Lungs: clear Abdomen: soft, nontender, nondistended. No hepatosplenomegaly. No bruits or masses. Good bowel sounds. Extremities: no cyanosis, clubbing, rash, 2+ edema Neuro: alert & orientedx3, cranial nerves grossly intact. moves all 4 extremities w/o difficulty. Affect pleasant  Volume status improved but needs  another day of IV diuresis. Once full diuresed hopefully we can control BP and fluid with benazepril/HCTX (lotensin). Can also cover with prn lasix. I will see again in the am.   Arvilla Meresaniel Cranston Koors, MD  2:09 PM

## 2020-10-28 LAB — BASIC METABOLIC PANEL
Anion gap: 8 (ref 5–15)
BUN: 9 mg/dL (ref 6–20)
CO2: 30 mmol/L (ref 22–32)
Calcium: 8.3 mg/dL — ABNORMAL LOW (ref 8.9–10.3)
Chloride: 101 mmol/L (ref 98–111)
Creatinine, Ser: 0.89 mg/dL (ref 0.44–1.00)
GFR, Estimated: >60 mL/min (ref >60)
Glucose, Bld: 90 mg/dL (ref 70–99)
Potassium: 3.4 mmol/L — ABNORMAL LOW (ref 3.5–5.1)
Sodium: 139 mmol/L (ref 135–145)

## 2020-10-28 MED ORDER — FUROSEMIDE 40 MG PO TABS: 40.0000 mg | ORAL_TABLET | Freq: Every day | ORAL | 11 refills | Status: DC

## 2020-10-28 MED ORDER — BENAZEPRIL HCL 10 MG PO TABS: 5.0000 mg | ORAL_TABLET | Freq: Every day | ORAL | 11 refills | Status: DC

## 2020-10-28 MED ORDER — POTASSIUM CHLORIDE CRYS ER 20 MEQ PO TBCR
60.0000 meq | EXTENDED_RELEASE_TABLET | Freq: Once | ORAL | Status: AC
  Administered 2020-10-28: 60 meq via ORAL
  Filled 2020-10-28: qty 3

## 2020-10-28 MED ORDER — IBUPROFEN 800 MG PO TABS
800.0000 mg | ORAL_TABLET | Freq: Three times a day (TID) | ORAL | Status: DC
  Administered 2020-10-28: 800 mg via ORAL
  Filled 2020-10-28: qty 1

## 2020-10-28 MED ORDER — FERROUS SULFATE 325 (65 FE) MG PO TABS
325.0000 mg | ORAL_TABLET | Freq: Every day | ORAL | Status: DC
  Administered 2020-10-28: 325 mg via ORAL
  Filled 2020-10-28: qty 1

## 2020-10-28 MED ORDER — POTASSIUM CHLORIDE CRYS ER 20 MEQ PO TBCR: 20.0000 meq | EXTENDED_RELEASE_TABLET | Freq: Every day | ORAL | 1 refills | Status: AC

## 2020-10-28 MED ORDER — HYDROCHLOROTHIAZIDE 12.5 MG PO CAPS: 12.5000 mg | ORAL_CAPSULE | Freq: Every day | ORAL | 11 refills | Status: AC

## 2020-10-28 MED ORDER — POTASSIUM CHLORIDE CRYS ER 20 MEQ PO TBCR: 20.0000 meq | EXTENDED_RELEASE_TABLET | Freq: Two times a day (BID) | ORAL | 1 refills | Status: DC

## 2020-10-28 NOTE — Progress Notes (Signed)
2 oxygen tanks and nasal cannula received, educated and given to patient to take home post discharge.

## 2020-10-28 NOTE — Plan of Care (Signed)
  Problem: Education: °Goal: Knowledge of General Education information will improve °Description: Including pain rating scale, medication(s)/side effects and non-pharmacologic comfort measures °Outcome: Adequate for Discharge °  °Problem: Health Behavior/Discharge Planning: °Goal: Ability to manage health-related needs will improve °Outcome: Adequate for Discharge °  °Problem: Clinical Measurements: °Goal: Ability to maintain clinical measurements within normal limits will improve °Outcome: Adequate for Discharge °Goal: Will remain free from infection °Outcome: Adequate for Discharge °Goal: Diagnostic test results will improve °Outcome: Adequate for Discharge °Goal: Respiratory complications will improve °Outcome: Adequate for Discharge °Goal: Cardiovascular complication will be avoided °Outcome: Adequate for Discharge °  °Problem: Activity: °Goal: Risk for activity intolerance will decrease °Outcome: Adequate for Discharge °  °Problem: Nutrition: °Goal: Adequate nutrition will be maintained °Outcome: Adequate for Discharge °  °Problem: Coping: °Goal: Level of anxiety will decrease °Outcome: Adequate for Discharge °  °Problem: Elimination: °Goal: Will not experience complications related to bowel motility °Outcome: Adequate for Discharge °Goal: Will not experience complications related to urinary retention °Outcome: Adequate for Discharge °  °Problem: Pain Managment: °Goal: General experience of comfort will improve °Outcome: Adequate for Discharge °  °Problem: Safety: °Goal: Ability to remain free from injury will improve °Outcome: Adequate for Discharge °  °Problem: Skin Integrity: °Goal: Risk for impaired skin integrity will decrease °Outcome: Adequate for Discharge °  °Problem: Education: °Goal: Knowledge of disease or condition will improve °Outcome: Adequate for Discharge °Goal: Knowledge of the prescribed therapeutic regimen will improve °Outcome: Adequate for Discharge °  °Problem: Fluid Volume: °Goal:  Peripheral tissue perfusion will improve °Outcome: Adequate for Discharge °  °Problem: Clinical Measurements: °Goal: Complications related to disease process, condition or treatment will be avoided or minimized °Outcome: Adequate for Discharge °  °

## 2020-10-28 NOTE — Progress Notes (Addendum)
Reviewed discharge instructions with patient regarding medications, when to call MD/ go to MAU, signs and symptoms of pre e and fluid overload, daily weights, heart healthy diet, recommendations for BP monitor and pulse ox, reviewed how to use DME oxygen with 2 tanks and nasal cannula, and to follow up as scheduled with cardiologists first week of August and with OB in 1 week. Patient verbalize understanding of discharge instructions, demonstrated teachback with O2 equipment and asked appropriate questions.   Pt left ambulatory with belongings and O2 tanks with husband at 1535.

## 2020-10-28 NOTE — Discharge Summary (Signed)
OB Discharge Summary  Patient Name: Abigail Fox DOB: [birth year] MRN: [id number]  Date of admission: 10/25/2020 Intrauterine pregnancy: [redacted]w[redacted]d  Admitting diagnosis: Preeclampsia in postpartum period [O14.95] Secondary diagnosis: Acute diastolic heart failure  Date of discharge: 10/28/2020    Discharge diagnosis:  Acute Diastolic Heart Failure, Hypokalemia, HTN      Prenatal history: [id number]  EDC : 10/17/2020, by Ultrasound  [facility] provider : Dr DDelora FuelPrenatal course complicated by Postpartum preeclampsia  Prenatal Labs: ABO, Rh: --/--/O POS (07/05 1021) / Rhophylac  Antibody: NEG (07/05 1021) Rubella:    / MMR booster  RPR: NON REACTIVE (07/05 1017)                            Delivering PROVIDER: DAVIES, MELISSA                                                            Complications: acute diastolic heart failure  Newborn Data: Live born female  Birth Weight: 7 lb 5.3 oz (3325 g) APGAR: 9, 9  Newborn Delivery   Birth date/time: 10/11/2020 13:03:00 Delivery type: C-Section, Low Transverse Trial of labor: No C-section categorization: Repeat     Labs: Lab Results  Component Value Date   WBC 12.1 (H) 10/25/2020   HGB 8.1 (L) 10/25/2020   HCT 25.3 (L) 10/25/2020   MCV 93.7 10/25/2020   PLT 234 10/25/2020   CMP Latest Ref Rng & Units 10/28/2020  Glucose 70 - 99 mg/dL 90  BUN 6 - 20 mg/dL 9  Creatinine 0.44 - 1.00 mg/dL 0.89  Sodium 135 - 145 mmol/L 139  Potassium 3.5 - 5.1 mmol/L 3.4(L)  Chloride 98 - 111 mmol/L 101  CO2 22 - 32 mmol/L 30  Calcium 8.9 - 10.3 mg/dL 8.3(L)  Total Protein 6.5 - 8.1 g/dL -  Total Bilirubin 0.3 - 1.2 mg/dL -  Alkaline Phos 38 - 126 U/L -  AST 15 - 41 U/L -  ALT 0 - 44 U/L -    Physical Exam @ time of discharge:  Vitals:   10/28/20 1135 10/28/20 1140 10/28/20 1150 10/28/20 1156  BP:  113/76    Pulse:  66    Resp:  18    Temp:      TempSrc:      SpO2: 100% 100% 99% 99%  Weight:      Height:        General:  Well appearing. No resp difficulty HEENT: normal Neck: supple. no JVD. Carotids 2+ bilat; no bruits. No lymphadenopathy or thryomegaly appreciated. Cor: PMI nondisplaced. Regular rate & rhythm. No rubs, gallops or murmurs. Lungs: clear Abdomen: soft, nontender, nondistended. No hepatosplenomegaly. No bruits or masses. Good bowel sounds. Extremities: no cyanosis, clubbing, rash, no edema + TEDs Neuro: alert & orientedx3, cranial nerves grossly intact. moves all 4 extremities w/o difficulty. Affect pleasant    Acute Diastolic Heart Failure - post partum, s/p C-section on 10/11/20 - denies HTN during pregnancy. SBPs this admit 140s-150s - CXR w/ pulmonary edema - Echo EF 55-60% w/ normal diastolic parameters - Good response to IV Lasix - Volume status much improved - Stable for d/c from our standpoint - Would d/c on benazepril  5 daily and HCTZ 12.5 and KCL 20 daily - Would also give her lasix 40mg tablets to take prn for recurrent volume overload. Take with Kcl 20    2. Hypokalemia - K 3.4 - will supp   3. HTN - bp improved   4. Nocturnal desats - suspect longstanding OSA - can go home on nocturnal O2 as needed - we will arrange sleep study as outpatient   OK for d/c from cardiology standpoint. Dr Pinn Aware of pt status and POC Follow up with Cardiology and Dr Davies in 1 week  Signed:  B  MSN, CNM 10/28/2020, 12:42 PM  

## 2020-10-28 NOTE — Progress Notes (Signed)
SATURATION QUALIFICATIONS: (This note is used to comply with regulatory documentation for home oxygen)  Patient Saturations on Room Air at Rest = 97%  Patient Saturations on Room Air while Ambulating = 84%  Patient Saturations on 2 Liters of oxygen while Ambulating = 96%  Please briefly explain why patient needs home oxygen: Patient comfortable at room air. When ambulating in room patient saturations dropped to 84% and patient had increased work of breathing.

## 2020-10-28 NOTE — TOC Transition Note (Signed)
Transition of Care [facility]) - CM/SW Discharge Note   Patient Details  Name: Abigail Fox MRN: [id number] Date of Birth: 1985/06/20  [facility] CM/SW Contact:  Lawerance Sabal, RN Phone Number: 10/28/2020, 1:45 PM   Clinical Narrative:    Charity oxygen ordered through [employer]. Tanks will be delivered to the room, and they will arrange concentrator at home for delivery today. Patient states that her pharmacy told her that her meds were $70. MATCH letter provided.     Final next level of care: Home/Self Care Barriers to Discharge: No Barriers Identified   Patient Goals and CMS Choice Patient states their goals for this hospitalization and ongoing recovery are:: to get well      Discharge Placement                       Discharge Plan and Services In-house Referral: Clinical Social Work Discharge Planning Services: CM Consult, Medication Assistance            DME Arranged: Oxygen DME Agency: [employer] Date DME Agency Contacted: 10/28/20 Time DME Agency Contacted: 1345 Representative spoke with at DME Agency: [facility] Arranged: NA          Social Determinants of Health (SDOH) Interventions     Readmission Risk Interventions No flowsheet data found.

## 2020-10-28 NOTE — Progress Notes (Signed)
Advanced Heart Failure Rounding Note  PCP-Cardiologist: None    Patient Profile   35 y/o female SW at [place] s/p C-section with 2nd child on 10/11/20 now admitted with acute HF. Denies being hypertensive throughout pregnancy. CXR with pulmonary edema. Echo with EF 55-60% normal diastolic parameters.     Subjective:    Responding well to IV Lasix. Out another 3L. Denies SOB. Edema resolved.  SBPs 110-120   Had some nocturnal desats   Objective:   Weight Range: 121.1 kg Body mass index is 45.83 kg/m.   Vital Signs:   Temp:  [98 F (36.7 C)-99.4 F (37.4 C)] 98 F (36.7 C) (07/23 0153) Pulse Rate:  [64-76] 73 (07/23 0750) Resp:  [17-20] 20 (07/23 0750) BP: (106-135)/(58-84) 106/68 (07/23 0750) SpO2:  [83 %-100 %] 98 % (07/23 0840)    Weight change: Filed Weights   10/25/20 2155  Weight: 121.1 kg    Intake/Output:   Intake/Output Summary (Last 24 hours) at 10/28/2020 0915 Last data filed at 10/28/2020 0744 Gross per 24 hour  Intake 1560 ml  Output 2475 ml  Net -915 ml       Physical Exam    General:  Well appearing. No resp difficulty HEENT: normal Neck: supple. no JVD. Carotids 2+ bilat; no bruits. No lymphadenopathy or thryomegaly appreciated. Cor: PMI nondisplaced. Regular rate & rhythm. No rubs, gallops or murmurs. Lungs: clear Abdomen: soft, nontender, nondistended. No hepatosplenomegaly. No bruits or masses. Good bowel sounds. Extremities: no cyanosis, clubbing, rash, no edema + TEDs Neuro: alert & orientedx3, cranial nerves grossly intact. moves all 4 extremities w/o difficulty. Affect pleasant   Telemetry   N/A    Labs    CBC Recent Labs    10/25/20 2244  WBC 12.1*  HGB 8.1*  HCT 25.3*  MCV 93.7  PLT 234    Basic Metabolic Panel Recent Labs    09/98/33 0708 10/28/20 0509  NA 138 139  K 3.1* 3.4*  CL 96* 101  CO2 32 30  GLUCOSE 105* 90  BUN 6 9  CREATININE 0.86 0.89  CALCIUM 7.5* 8.3*    Liver Function  Tests Recent Labs    10/25/20 2244  AST 26  ALT 27  ALKPHOS 97  BILITOT 2.1*  PROT 6.5  ALBUMIN 3.4*    No results for input(s): LIPASE, AMYLASE in the last 72 hours. Cardiac Enzymes No results for input(s): CKTOTAL, CKMB, CKMBINDEX, TROPONINI in the last 72 hours.  BNP: BNP (last 3 results) Recent Labs    10/25/20 2245  BNP 181.5*     ProBNP (last 3 results) No results for input(s): PROBNP in the last 8760 hours.   D-Dimer No results for input(s): DDIMER in the last 72 hours. Hemoglobin A1C No results for input(s): HGBA1C in the last 72 hours. Fasting Lipid Panel No results for input(s): CHOL, HDL, LDLCALC, TRIG, CHOLHDL, LDLDIRECT in the last 72 hours. Thyroid Function Tests No results for input(s): TSH, T4TOTAL, T3FREE, THYROIDAB in the last 72 hours.  Invalid input(s): FREET3  Other results:   Imaging    No results found.   Medications:     Scheduled Medications:  benazepril  5 mg Oral Daily   buprenorphine  8 mg Sublingual BID   ferrous sulfate  325 mg Oral Q breakfast   furosemide  40 mg Intravenous BID   mouth rinse  15 mL Mouth Rinse BID   potassium chloride  20 mEq Oral BID   prenatal multivitamin  1  tablet Oral q morning   valACYclovir  500 mg Oral Daily    Infusions:  lactated ringers Stopped (10/27/20 0145)    PRN Medications: butalbital-acetaminophen-caffeine, guaiFENesin, hydrALAZINE **AND** hydrALAZINE **AND** labetalol **AND** labetalol **AND** Measure blood pressure, ondansetron    Patient Profile   35 y/o female SW at [place] s/p C-section with 2nd child on 10/11/20 now admitted with acute HF. Denies being hypertensive throughout pregnancy. CXR with pulmonary edema. Echo with EF 55-60% normal diastolic parameters.     Assessment/Plan   Acute Diastolic Heart Failure - post partum, s/p C-section on 10/11/20 - denies HTN during pregnancy. SBPs this admit 140s-150s - CXR w/ pulmonary edema  - Echo EF 55-60% w/ normal  diastolic parameters  - Good response to IV Lasix - Volume status much improved - Stable for d/c from our standpoint - Would d/c on benazepril 5 daily and HCTZ 12.5 and KCL 20 daily - Would also give her lasix 40mg  tablets to take prn for recurrent volume overload. Take with Kcl 20   2. Hypokalemia - K 3.4 - will supp   3. HTN - bp improved  4. Nocturnal desats - suspect longstanding OSA - can go home on nocturnal O2 as needed - we will arrange sleep study as outpatient  OK for d/c from our standpoint.   Length of Stay: 2  , MD  10/28/2020, 9:15 AM  Advanced Heart Failure Team Pager [phone] (M-F; 7a - 5p)  Please contact [facility] for night-coverage after hours (5p -7a ) and weekends on [email or web address]

## 2020-11-04 NOTE — Progress Notes (Signed)
[facility] Note    PCP: Wilfrid Lund, PA PCP-Cardiologist: None  HF Cardiologist: Dr. Gala Romney  HPI: Abigail Fox is a 35 y.o. 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  She was discharged home from an uneventful delivery via C-section 10/11/20, bilateral tubal removal, and post partum hospitalization. She presented back to [facility] 7/20 with SOB and LE edema. CXR consistent with pulmonary edema. Admitted for acute diastolic HF, symptoms improved with IV lasix.. HF consulted to assist with management. Echo with EF 55-60% w/ normal diastolic parameters. GDMT limited due to patient breastfeeding. She was discharged on hctz 12.5, benazepril 5, with prn lasix. Weight 267 lbs.  Today she returns for post hospitalization HF follow up. Overall feeling fine. Denies increasing SOB, CP, dizziness, edema, or PND/Orthopnea. Appetite ok. No fever or chills. Does not weigh at home. Taking all medications and has been taking lasix daily. Works as a Child psychotherapist. Occasional THC use, working on losing weight. Still using oxygen at night.  Review of Systems: [y] = yes, [ ]  = no   General: Weight gain [ ] ; Weight loss [y]; Anorexia [ ] ; Fatigue [ ] ; Fever [ ] ; Chills [ ] ; Weakness [ ]   Cardiac: Chest pain/pressure [ ] ; Resting SOB [ ] ; Exertional SOB [ ] ; Orthopnea [ ] ; Pedal Edema [ ] ; Palpitations [ ] ; Syncope [ ] ; Presyncope [ ] ; Paroxysmal nocturnal dyspnea[ ]   Pulmonary: Cough [ ] ; Wheezing[ ] ; Hemoptysis[ ] ; Sputum [ ] ; Snoring [ ]   GI: Vomiting[ ] ; Dysphagia[ ] ; Melena[ ] ; Hematochezia [ ] ; Heartburn[ ] ; Abdominal pain [ ] ; Constipation [ ] ; Diarrhea [ ] ; BRBPR [ ]   GU: Hematuria[ ] ; Dysuria [ ] ; Nocturia[ ]   Vascular: Pain in legs with walking [ ] ; Pain in feet with lying flat [ ] ; Non-healing sores [ ] ; Stroke [ ] ; TIA [ ] ; Slurred speech [ ] ;  Neuro: Headaches[ ] ; Vertigo[  ]; Seizures[ ] ; Paresthesias[ ] ;Blurred vision [ ] ; Diplopia [ ] ; Vision changes [ ]   Ortho/Skin: Arthritis [ ] ; Joint pain [ ] ; Muscle pain [ ] ; Joint swelling [ ] ; Back Pain [ ] ; Rash [ ]   Psych: Depression[ ] ; Anxiety[ ]   Heme: Bleeding problems [ ] ; Clotting disorders [ ] ; Anemia [ ]   Endocrine: Diabetes [ ] ; Thyroid dysfunction[ ]   Past Medical History:  Diagnosis Date   Anxiety    Drug addiction (HCC)    subutex   Hepatitis    hepatitis C   Kidney stones    MRSA infection 2010,2013   Ovarian cyst    Current Outpatient Medications  Medication Sig Dispense Refill   acetaminophen (TYLENOL) 500 MG tablet Take 500 mg by mouth every 6 (six) hours as needed.     benazepril (LOTENSIN) 10 MG tablet Take 10 mg by mouth daily.     buprenorphine (SUBUTEX) 8 MG SUBL SL tablet Place 8 mg under the tongue 2 (two) times daily.  0   ferrous sulfate 325 (65 FE) MG tablet Take 325 mg by mouth daily with breakfast.     furosemide (LASIX) 40 MG tablet Take 1 tablet (40 mg total) by mouth daily. 30 tablet 11   hydrochlorothiazide (MICROZIDE) 12.5 MG capsule Take 1 capsule (12.5 mg total) by mouth daily. 30 capsule 11   ibuprofen (ADVIL) 800 MG tablet Take 1 tablet (800 mg total) by mouth every 8 (eight) hours as needed. 60  tablet 3   potassium chloride SA (KLOR-CON) 20 MEQ tablet Take 1 tablet (20 mEq total) by mouth daily. 30 tablet 1   Prenatal Vit-Fe Fumarate-FA (PRENATAL MULTIVITAMIN) TABS tablet Take 1 tablet by mouth daily at 12 noon.     No current facility-administered medications for this encounter.   No Known Allergies  Social History   Socioeconomic History   Marital status: Married    Spouse name: Not on file   Number of children: 1   Years of education: Not on file   Highest education level: Not on file  Occupational History   Occupation: customer srevice  Tobacco Use   Smoking status: Never   Smokeless tobacco: Never  Vaping Use   Vaping Use: Never used  Substance and  Sexual Activity   Alcohol use: No   Drug use: Not Currently    Types: Marijuana    Comment: February 15, 2020   Sexual activity: Yes    Birth control/protection: None  Other Topics Concern   Not on file  Social History Narrative   Not on file   Social Determinants of Health   Financial Resource Strain: Not on file  Food Insecurity: Not on file  Transportation Needs: Not on file  Physical Activity: Not on file  Stress: Not on file  Social Connections: Not on file  Intimate Partner Violence: Not on file   Family History  Problem Relation Age of Onset   Diabetes Mother    Hypertension Mother    Heart disease Maternal Grandfather    Kidney cancer Maternal Grandmother    Lung cancer Paternal Grandmother    BP 129/80   Pulse 82   Wt 106.6 kg (235 lb)   SpO2 96%   BMI 40.34 kg/m   Wt Readings from Last 3 Encounters:  11/06/20 106.6 kg (235 lb)  10/25/20 121.1 kg (267 lb)  10/11/20 121.1 kg (267 lb)   PHYSICAL EXAM: General:  NAD. No resp difficulty HEENT: Normal Neck: Supple. No JVD. Carotids 2+ bilat; no bruits. No lymphadenopathy or thryomegaly appreciated. Cor: PMI nondisplaced. Regular rate & rhythm. No rubs, gallops or murmurs. Lungs: Clear Abdomen: Obese, nontender, nondistended. No hepatosplenomegaly. No bruits or masses. Good bowel sounds. Extremities: No cyanosis, clubbing, rash, edema Neuro: Alert & oriented x 3, cranial nerves grossly intact. Moves all 4 extremities w/o difficulty. Affect pleasant.  ECG: SR 70 bpm (personally reviewed).  ASSESSMENT & PLAN: Chronic Diastolic Heart Failure - Post partum, s/p C-section on 10/11/20.  - Echo (7/22): EF 55-60% w/ normal diastolic parameters. - NYHA I-II, volume good today. - Continue benazepril 5 mg daily. - Continue HCTZ 12.5 mg daily + KCl 20 mEq daily. - Change lasix to 40 mg (PRN for weight gain 3 lbs/24 hrs or 5 lbs/week )+ 20 KCl. - BMET today.   2. HTN - Much improved. - Continue current regimen.    3. Suspect OSA - Continue oxygen at night. - Arrange for sleep study.  4. Obesity Body mass index is 40.34 kg/m. - Dedicated to losing weight, encouraged to continue exercise and dietary changes.  Personally discussed with Dr. Gala Romney, will repeat echo in 3 months and follow up  [facility], FNP-BC 11/06/20

## 2020-11-06 ENCOUNTER — Encounter (HOSPITAL_COMMUNITY): Payer: Self-pay

## 2020-11-06 ENCOUNTER — Ambulatory Visit (HOSPITAL_COMMUNITY)
Admission: RE | Admit: 2020-11-06 | Discharge: 2020-11-06 | Disposition: A | Discharge status: Home / Self Care | Payer: 59 | Source: Ambulatory Visit | Attending: Family Medicine | Admitting: Family Medicine

## 2020-11-06 ENCOUNTER — Other Ambulatory Visit: Payer: Self-pay

## 2020-11-06 VITALS — BP 129/80 | HR 82 | Wt 235.0 lb

## 2020-11-06 DIAGNOSIS — Z6841 Body Mass Index (BMI) 40.0 and over, adult: Secondary | ICD-10-CM | POA: Insufficient documentation

## 2020-11-06 DIAGNOSIS — I110 Hypertensive heart disease with heart failure: Secondary | ICD-10-CM | POA: Insufficient documentation

## 2020-11-06 DIAGNOSIS — B1920 Unspecified viral hepatitis C without hepatic coma: Secondary | ICD-10-CM | POA: Diagnosis not present

## 2020-11-06 DIAGNOSIS — E669 Obesity, unspecified: Secondary | ICD-10-CM | POA: Insufficient documentation

## 2020-11-06 DIAGNOSIS — I5032 Chronic diastolic (congestive) heart failure: Secondary | ICD-10-CM | POA: Insufficient documentation

## 2020-11-06 DIAGNOSIS — I10 Essential (primary) hypertension: Secondary | ICD-10-CM

## 2020-11-06 DIAGNOSIS — O99325 Drug use complicating the puerperium: Secondary | ICD-10-CM | POA: Diagnosis not present

## 2020-11-06 DIAGNOSIS — Z79899 Other long term (current) drug therapy: Secondary | ICD-10-CM | POA: Insufficient documentation

## 2020-11-06 DIAGNOSIS — G4734 Idiopathic sleep related nonobstructive alveolar hypoventilation: Secondary | ICD-10-CM

## 2020-11-06 DIAGNOSIS — O9943 Diseases of the circulatory system complicating the puerperium: Secondary | ICD-10-CM | POA: Insufficient documentation

## 2020-11-06 LAB — BASIC METABOLIC PANEL
Anion gap: 8 (ref 5–15)
BUN: 13 mg/dL (ref 6–20)
CO2: 28 mmol/L (ref 22–32)
Calcium: 9.6 mg/dL (ref 8.9–10.3)
Chloride: 99 mmol/L (ref 98–111)
Creatinine, Ser: 0.95 mg/dL (ref 0.44–1.00)
GFR, Estimated: >60 mL/min (ref >60)
Glucose, Bld: 85 mg/dL (ref 70–99)
Potassium: 4 mmol/L (ref 3.5–5.1)
Sodium: 135 mmol/L (ref 135–145)

## 2020-11-06 MED ORDER — FUROSEMIDE 40 MG PO TABS: 40.0000 mg | ORAL_TABLET | ORAL | 11 refills | Status: AC | PRN

## 2020-11-06 NOTE — Patient Instructions (Signed)
CHANGE Lasix to 40 mg as needed for 3 lb weight gain over night or 5 lb weight gain in a week  Labs today We will only contact you if something comes back abnormal or we need to make some changes. Otherwise no news is good news!  Your physician recommends that you schedule a follow-up appointment in: 3 months with Dr Gala Romney and echo  Your physician has requested that you have an echocardiogram. Echocardiography is a painless test that uses sound waves to create images of your heart. It provides your doctor with information about the size and shape of your heart and how well your heart's chambers and valves are working. This procedure takes approximately one hour. There are no restrictions for this procedure.   milAt the [facility], you and your health needs are our priority. As part of our continuing mission to provide you with exceptional heart care, we have created designated Provider Care Teams. These Care Teams include your primary Cardiologist (physician) and Advanced Practice Providers (APPs- Physician Assistants and Nurse Practitioners) who all work together to provide you with the care you need, when you need it.   You may see any of the following providers on your designated Care Team at your next follow up: Dr Arvilla Meres Dr Marca Ancona Dr Brandon Melnick, NP Robbie Lis, [place] Mikki Santee Karle Plumber, PharmD   Please be sure to bring in all your medications bottles to every appointment.

## 2020-11-10 ENCOUNTER — Encounter (HOSPITAL_COMMUNITY): Payer: Medicaid Other

## 2021-02-06 ENCOUNTER — Other Ambulatory Visit: Payer: Self-pay

## 2021-02-06 ENCOUNTER — Ambulatory Visit (HOSPITAL_BASED_OUTPATIENT_CLINIC_OR_DEPARTMENT_OTHER)
Admission: RE | Admit: 2021-02-06 | Discharge: 2021-02-06 | Disposition: A | Discharge status: Home / Self Care | Payer: 59 | Source: Ambulatory Visit | Attending: Internal Medicine | Admitting: Internal Medicine

## 2021-02-06 ENCOUNTER — Encounter (HOSPITAL_COMMUNITY): Payer: Self-pay | Admitting: Internal Medicine

## 2021-02-06 ENCOUNTER — Ambulatory Visit (HOSPITAL_COMMUNITY)
Admission: RE | Admit: 2021-02-06 | Discharge: 2021-02-06 | Disposition: A | Discharge status: Home / Self Care | Payer: 59 | Source: Ambulatory Visit | Attending: Family Medicine | Admitting: Family Medicine

## 2021-02-06 VITALS — BP 120/70 | HR 57 | Wt 239.2 lb

## 2021-02-06 DIAGNOSIS — B1920 Unspecified viral hepatitis C without hepatic coma: Secondary | ICD-10-CM | POA: Diagnosis not present

## 2021-02-06 DIAGNOSIS — I5032 Chronic diastolic (congestive) heart failure: Secondary | ICD-10-CM | POA: Diagnosis present

## 2021-02-06 DIAGNOSIS — I429 Cardiomyopathy, unspecified: Secondary | ICD-10-CM | POA: Diagnosis not present

## 2021-02-06 DIAGNOSIS — E669 Obesity, unspecified: Secondary | ICD-10-CM | POA: Insufficient documentation

## 2021-02-06 DIAGNOSIS — Z79899 Other long term (current) drug therapy: Secondary | ICD-10-CM | POA: Insufficient documentation

## 2021-02-06 DIAGNOSIS — I110 Hypertensive heart disease with heart failure: Secondary | ICD-10-CM | POA: Insufficient documentation

## 2021-02-06 DIAGNOSIS — I10 Essential (primary) hypertension: Secondary | ICD-10-CM

## 2021-02-06 DIAGNOSIS — Z6841 Body Mass Index (BMI) 40.0 and over, adult: Secondary | ICD-10-CM | POA: Insufficient documentation

## 2021-02-06 DIAGNOSIS — Z8249 Family history of ischemic heart disease and other diseases of the circulatory system: Secondary | ICD-10-CM | POA: Insufficient documentation

## 2021-02-06 DIAGNOSIS — R0683 Snoring: Secondary | ICD-10-CM | POA: Diagnosis not present

## 2021-02-06 LAB — ECHOCARDIOGRAM COMPLETE
Area-P 1/2: 3.77 cm2
Calc EF: 60.5 %
S' Lateral: 3.7 cm
Single Plane A2C EF: 58.4 %
Single Plane A4C EF: 56.7 %

## 2021-02-06 NOTE — Progress Notes (Signed)
Advanced [facility] Failure Clinic Note    PCP: Wilfrid Lund, PA PCP-Cardiologist: None  HF Cardiologist: Dr. Gala Romney  HPI: Abigail Fox is a 35 y.o. 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  She was discharged home from an uneventful delivery via C-section 10/11/20, bilateral tubal removal, and post partum hospitalization. She presented back to [facility] 7/20 with SOB and LE edema. CXR consistent with pulmonary edema. Admitted for acute diastolic HF, symptoms improved with IV lasix.. HF consulted to assist with management. Echo with EF 55-60% w/ normal diastolic parameters. GDMT limited due to patient breastfeeding. She was discharged on hctz 12.5, benazepril 5, with prn lasix. Weight 267 lbs.  Here for routine f/u. Doing great. Working 2 jobs and taking care of her 49 month old daughter and 3 y/o. Not taking lasix. BP well controlled.No orthopnea or PND. Husband says she snores at night. Still waiting on authorization code for [employer].   Past Medical History:  Diagnosis Date   Anxiety    Drug addiction (HCC)    subutex   Hepatitis    hepatitis C   Kidney stones    MRSA infection 2010,2013   Ovarian cyst    Current Outpatient Medications  Medication Sig Dispense Refill   acetaminophen (TYLENOL) 500 MG tablet Take 500 mg by mouth every 6 (six) hours as needed.     benazepril (LOTENSIN) 10 MG tablet Take 10 mg by mouth daily.     buprenorphine (SUBUTEX) 8 MG SUBL SL tablet Place 8 mg under the tongue 2 (two) times daily.  0   ferrous sulfate 325 (65 FE) MG tablet Take 325 mg by mouth daily with breakfast.     furosemide (LASIX) 40 MG tablet Take 1 tablet (40 mg total) by mouth as needed for edema. 30 tablet 11   hydrochlorothiazide (MICROZIDE) 12.5 MG capsule Take 1 capsule (12.5 mg total) by mouth daily. 30 capsule 11   ibuprofen (ADVIL) 800 MG tablet Take 1 tablet (800 mg  total) by mouth every 8 (eight) hours as needed. 60 tablet 3   potassium chloride SA (KLOR-CON) 20 MEQ tablet Take 1 tablet (20 mEq total) by mouth daily. 30 tablet 1   Prenatal Vit-Fe Fumarate-FA (PRENATAL MULTIVITAMIN) TABS tablet Take 1 tablet by mouth daily at 12 noon.     No current facility-administered medications for this encounter.   No Known Allergies  Social History   Socioeconomic History   Marital status: Married    Spouse name: Not on file   Number of children: 1   Years of education: Not on file   Highest education level: Not on file  Occupational History   Occupation: customer srevice  Tobacco Use   Smoking status: Never   Smokeless tobacco: Never  Vaping Use   Vaping Use: Never used  Substance and Sexual Activity   Alcohol use: No   Drug use: Not Currently    Types: Marijuana    Comment: February 15, 2020   Sexual activity: Yes    Birth control/protection: None  Other Topics Concern   Not on file  Social History Narrative   Not on file   Social Determinants of Health   Financial Resource Strain: Not on file  Food Insecurity: Not on file  Transportation Needs: Not on file  Physical Activity: Not on file  Stress: Not on file  Social Connections: Not on file  Intimate Partner  Violence: Not on file   Family History  Problem Relation Age of Onset   Diabetes Mother    Hypertension Mother    Heart disease Maternal Grandfather    Kidney cancer Maternal Grandmother    Lung cancer Paternal Grandmother    BP 120/70   Pulse (!) 57   Wt 108.5 kg (239 lb 3.2 oz)   SpO2 97%   BMI 41.06 kg/m   Wt Readings from Last 3 Encounters:  02/06/21 108.5 kg (239 lb 3.2 oz)  11/06/20 106.6 kg (235 lb)  10/25/20 121.1 kg (267 lb)   PHYSICAL EXAM: General:  Well appearing. No resp difficulty HEENT: normal Neck: supple. no JVD. Carotids 2+ bilat; no bruits. No lymphadenopathy or thryomegaly appreciated. Cor: PMI nondisplaced. Regular rate & rhythm. No rubs,  gallops or murmurs. Lungs: clear Abdomen: obese soft, nontender, nondistended. No hepatosplenomegaly. No bruits or masses. Good bowel sounds. Extremities: no cyanosis, clubbing, rash, edema Neuro: alert & orientedx3, cranial nerves grossly intact. moves all 4 extremities w/o difficulty. Affect pleasant  ASSESSMENT & PLAN: Chronic Diastolic Heart Failure - Post partum, s/p C-section on 10/11/20.  - Echo (7/22): EF 55-60% w/ normal diastolic parameters. - Echo today 02/06/10 EF 55-60% normal diastolic - NYHA I.  Volume status ok   - Continue benazepril 5 mg daily. - Continue HCTZ 12.5 mg daily + KCl 20 mEq daily. - Change lasix to 40 mg (PRN for weight gain 3 lbs/24 hrs or 5 lbs/week )+ 20 KCl. - Labs today   2. HTN -Blood pressure well controlled. Continue current regimen.   3. Suspect OSA - Continue oxygen at night as needed - Has sleep study at home needs authorization code   4. Obesity Body mass index is 41.06 kg/m. - Continue weight loss efforts  Arvilla Meres, MD  2:39 PM

## 2021-02-06 NOTE — Patient Instructions (Signed)
No Labs done today.   No medication changes were made. Please continue all current medications as prescribed.  Your provider has recommended that you have a home sleep study.  We have provided you with the equipment in our office today. Please download the app and follow the instructions. YOUR PIN NUMBER IS: [id number]. Once you have completed the test you just dispose of the equipment, the information is automatically uploaded to [place] via blue-tooth technology. If your test is positive for sleep apnea and you need a home CPAP machine you will be contacted by Dr Norris Cross office [facility]) to set this up.  Your physician recommends that you schedule a follow-up appointment in: 1 year. Please contact our office in October 2023 to schedule a November 2023 appointment.   If you have any questions or concerns before your next appointment please send [place] a message through [facility] or call our office at [phone].    TO LEAVE A MESSAGE FOR THE NURSE SELECT OPTION 2, PLEASE LEAVE A MESSAGE INCLUDING: YOUR NAME DATE OF BIRTH CALL BACK NUMBER REASON FOR CALL**this is important as we prioritize the call backs  YOU WILL RECEIVE A CALL BACK THE SAME DAY AS LONG AS YOU CALL BEFORE 4:00 PM   Do the following things EVERYDAY: Weigh yourself in the morning before breakfast. Write it down and keep it in a log. Take your medicines as prescribed Eat low salt foods--Limit salt (sodium) to 2000 mg per day.  Stay as active as you can everyday Limit all fluids for the day to less than 2 liters   At the [facility], you and your health needs are our priority. As part of our continuing mission to provide you with exceptional heart care, we have created designated Provider Care Teams. These Care Teams include your primary Cardiologist (physician) and Advanced Practice Providers (APPs- Physician Assistants and Nurse Practitioners) who all work together to provide you with the care you need, when you  need it.   You may see any of the following providers on your designated Care Team at your next follow up: Dr Arvilla Meres Dr Carron Curie, NP Robbie Lis, [place] Karle Plumber, PharmD   Please be sure to bring in all your medications bottles to every appointment.

## 2021-02-06 NOTE — Addendum Note (Signed)
Encounter addended by: Chinita Pester, CMA on: 02/06/2021 2:48 PM  Actions taken: Clinical Note Signed

## 2021-02-06 NOTE — Progress Notes (Signed)
  Echocardiogram 2D Echocardiogram has been performed.  Abigail Fox 02/06/2021, 2:05 PM

## 2021-02-07 ENCOUNTER — Telehealth (HOSPITAL_COMMUNITY): Payer: Self-pay | Admitting: Surgery

## 2021-02-07 NOTE — Telephone Encounter (Signed)
Patient contacted in reference to ordered home sleep study.  No precert required per Ceasar Lund CMA for test through [facility].  I informed her that she is good to proceed with completing the study.  She denies any further questions at this time and will perform the study soon.

## 2021-02-12 ENCOUNTER — Telehealth (HOSPITAL_COMMUNITY): Payer: Self-pay | Admitting: Surgery

## 2021-02-12 NOTE — Telephone Encounter (Signed)
Patient called to remind to perform ordered home sleep study.  She says that she will complete it "tonite".

## 2021-04-05 ENCOUNTER — Telehealth (HOSPITAL_COMMUNITY): Payer: Self-pay | Admitting: Surgery

## 2021-04-05 NOTE — Telephone Encounter (Signed)
Information sent for patient to be charged for home sleep study device.  Patient has not completed the study after several phone calls and reminders °

## 2021-07-04 IMAGING — US US OB TRANSVAGINAL
1 series · 15 of 28 positions shown · non-contrast
Comparison: February 22, 2020

CLINICAL DATA: Viability assessment. Apparent bradycardia on prior
study.

EXAM:
TRANSVAGINAL OB ULTRASOUND
TECHNIQUE: Transvaginal ultrasound was performed for complete evaluation of the
gestation as well as the maternal uterus, adnexal regions, and
pelvic cul-de-sac.

[Series 1: us ob transvaginal · 51 acquisitions, 15 frames shown]
[im 1/51]
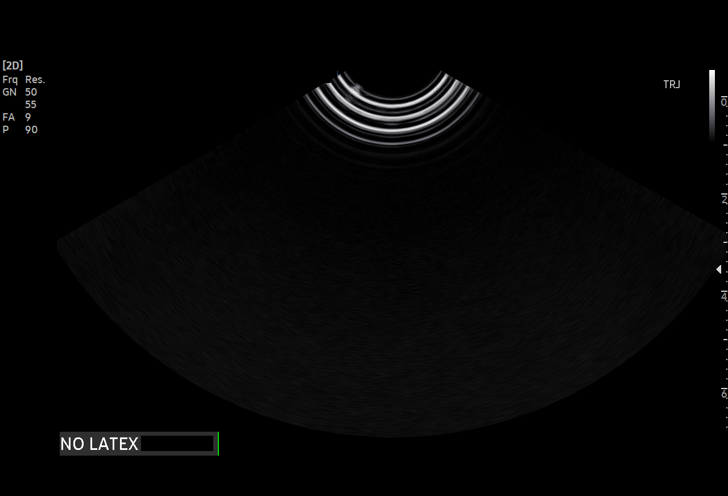
[im 4/51]
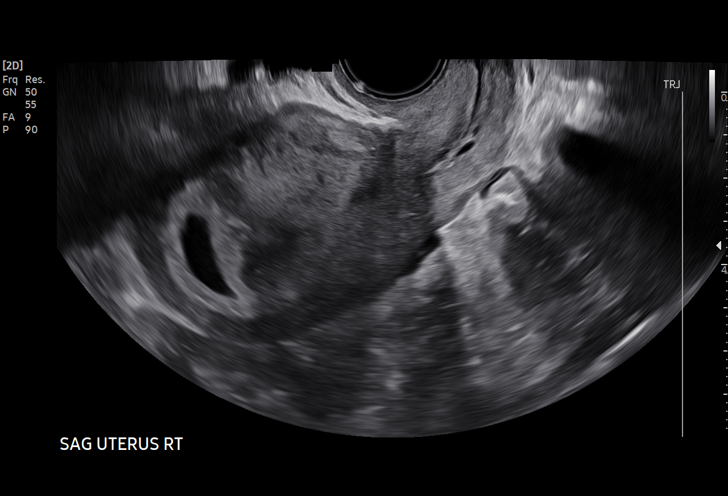
[im 8/51]
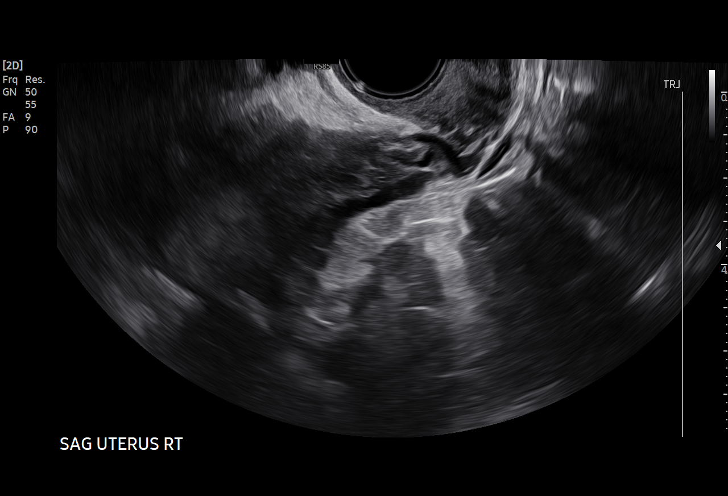
[im 12/51]
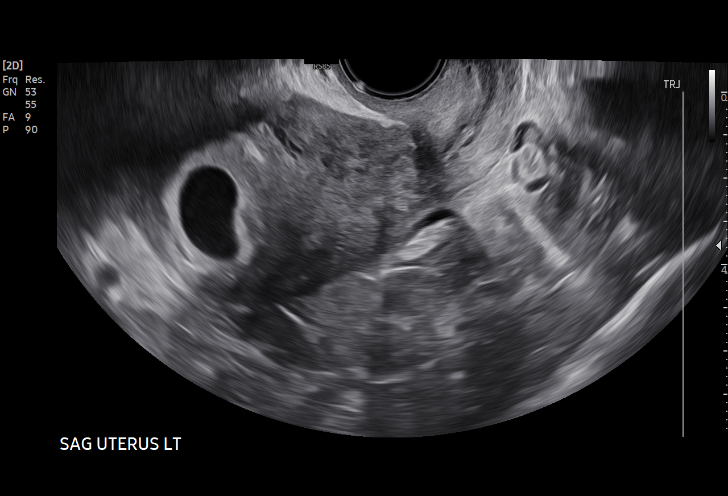
[im 15/51]
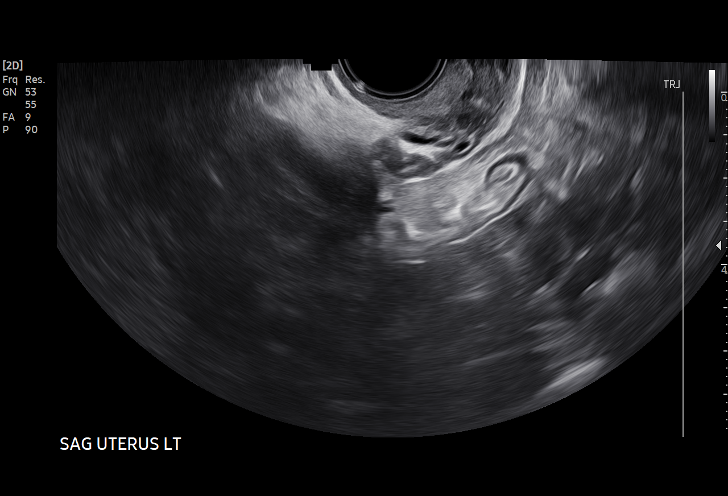
[im 19/51]
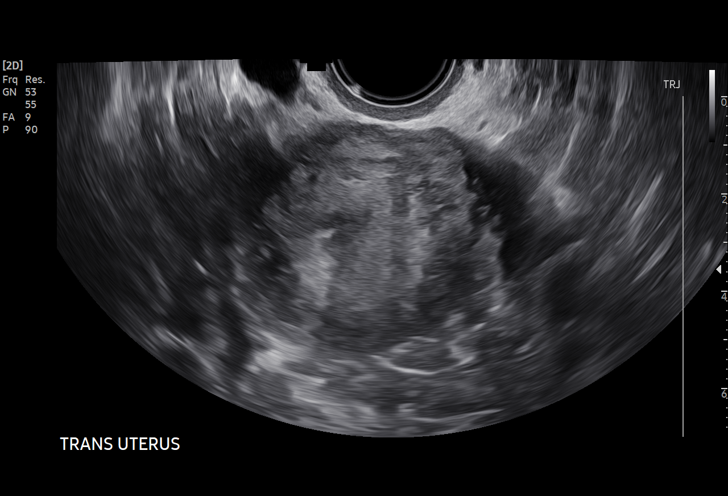
[im 23/51]
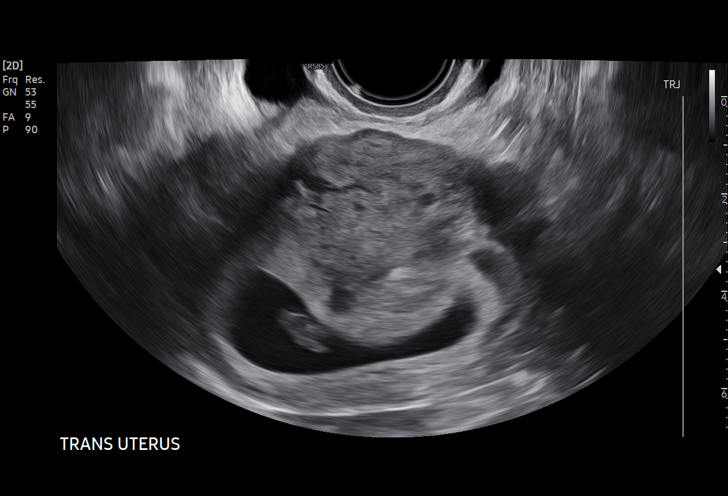
[im 26/51]
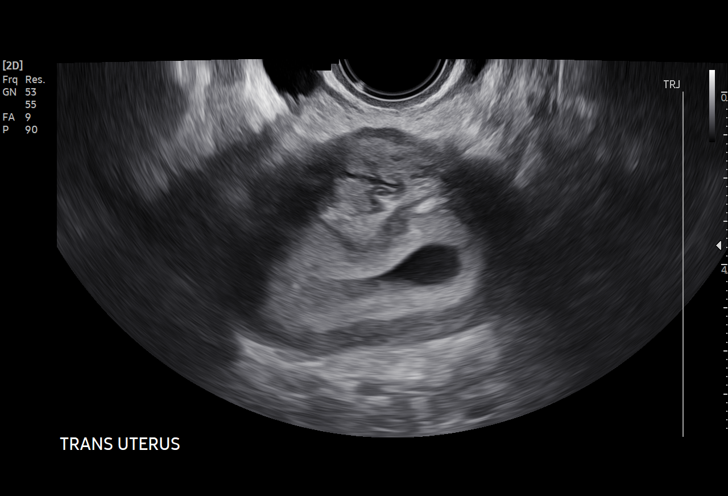
[im 28/51]
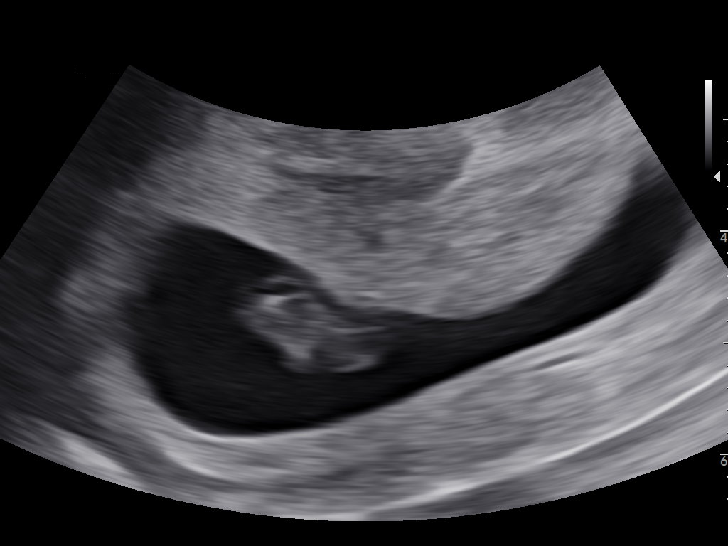
[im 32/51]
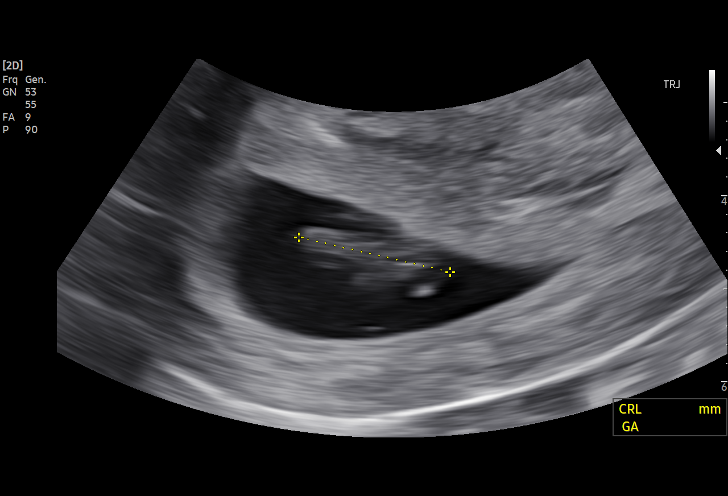
[im 36/51]
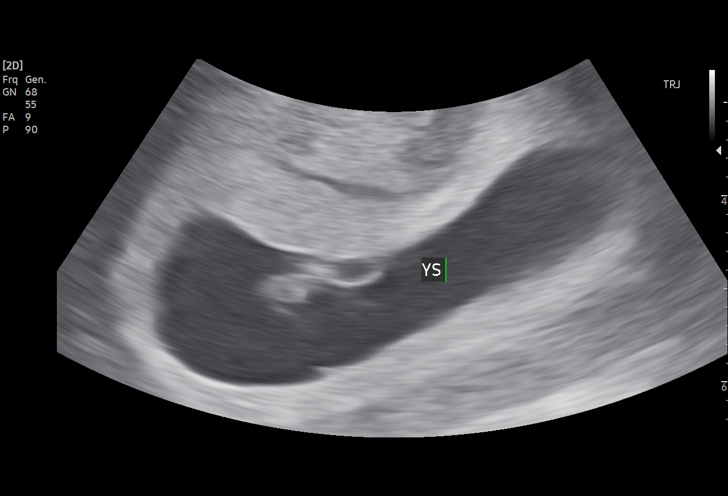
[im 39/51]
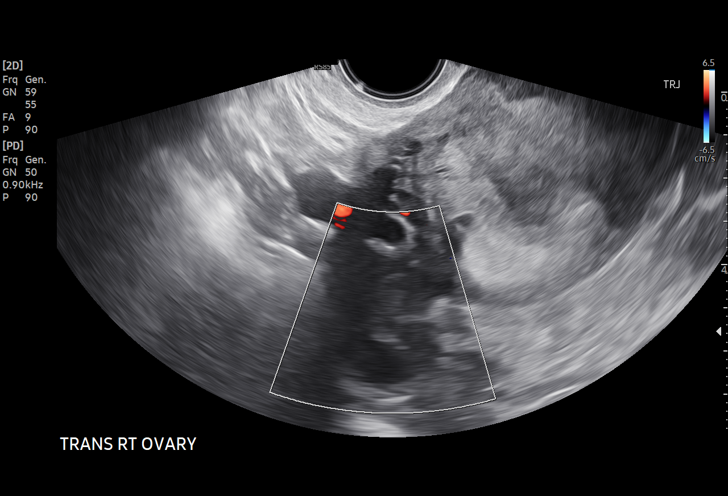
[im 43/51]
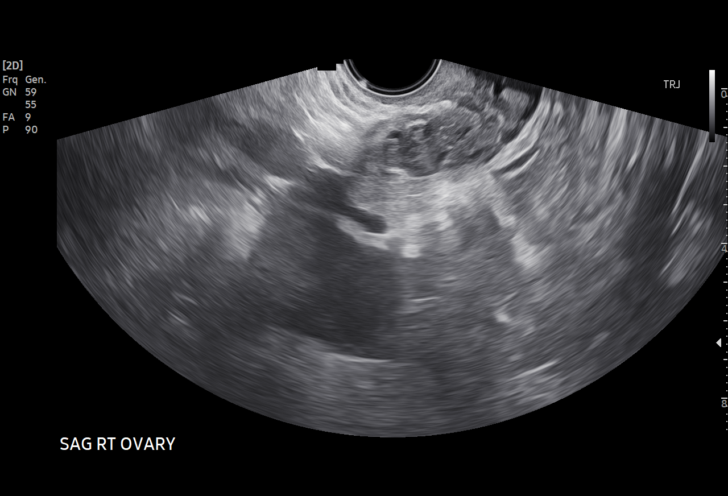
[im 47/51]
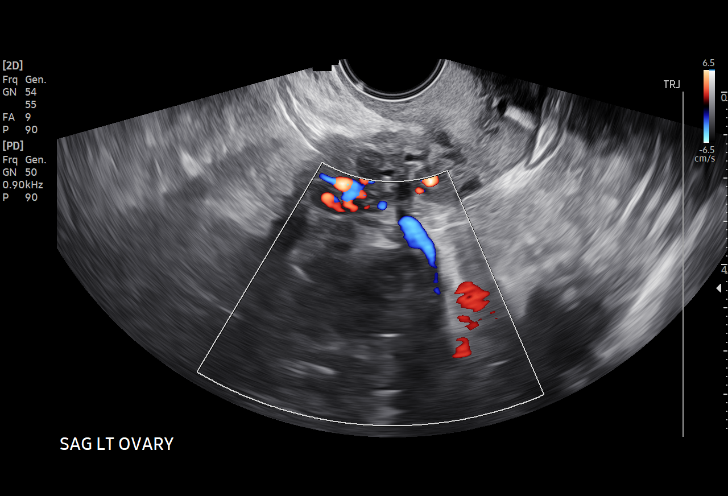
[im 51/51]
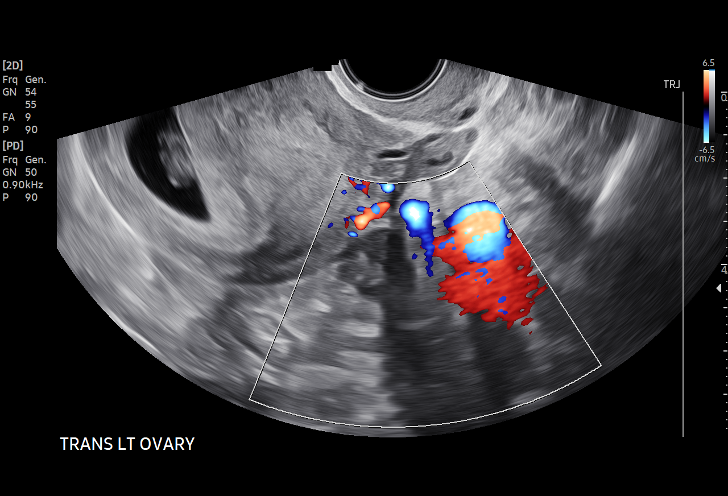

[15 of 28 positions shown; findings below may reference images not displayed]

FINDINGS: Intrauterine gestational sac: Visualized-single

Yolk sac:  Visualized

Embryo:  Visualized

Cardiac Activity: Visualized

Heart Rate: 150 bpm

CRL:   17 mm   8 w 1 d                  US EDC: October 17, 2020

Subchorionic hemorrhage:  None visualized.

Maternal uterus/adnexae: Cervical os closed. Left ovary measures
x 2.6 x 2.7 cm. Right ovary measures 4.7 x 3.4 x 2.1 cm. No
extrauterine pelvic mass or free fluid.
IMPRESSION: Single live intrauterine gestation with estimated gestational age of
approximately 8 weeks. Fetal cardiac activity noted with measured
heart rate within normal range at 150 beats per minute.

Study otherwise unremarkable.

## 2021-07-11 DIAGNOSIS — I5032 Chronic diastolic (congestive) heart failure: Secondary | ICD-10-CM

## 2021-12-24 ENCOUNTER — Other Ambulatory Visit (HOSPITAL_COMMUNITY): Payer: Self-pay

## 2021-12-24 IMAGING — US US MFM OB FOLLOW-UP
1 series · 14 of 28 positions shown · non-contrast
Comparison: none

[Series 1: us mfm ob follow-up · 46 acquisitions, 14 frames shown]
[im 2/46]
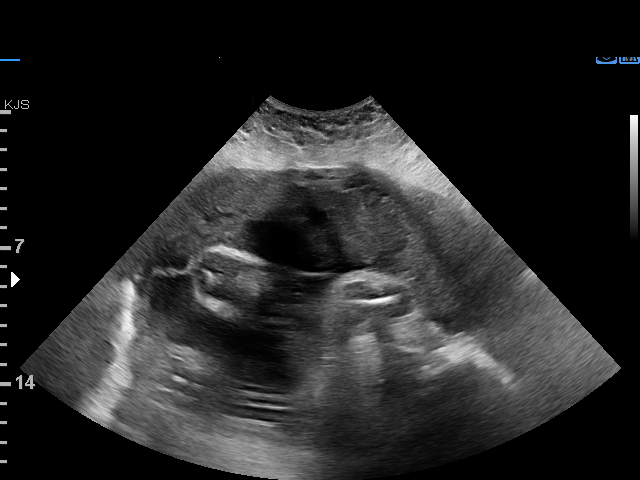
[im 6/46]
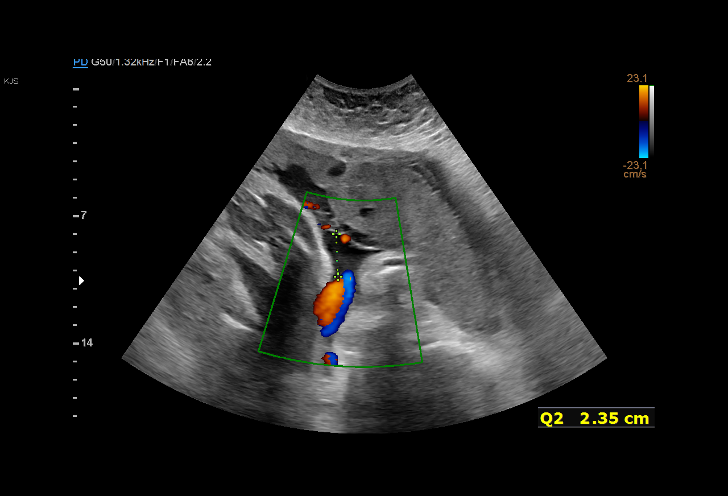
[im 9/46]
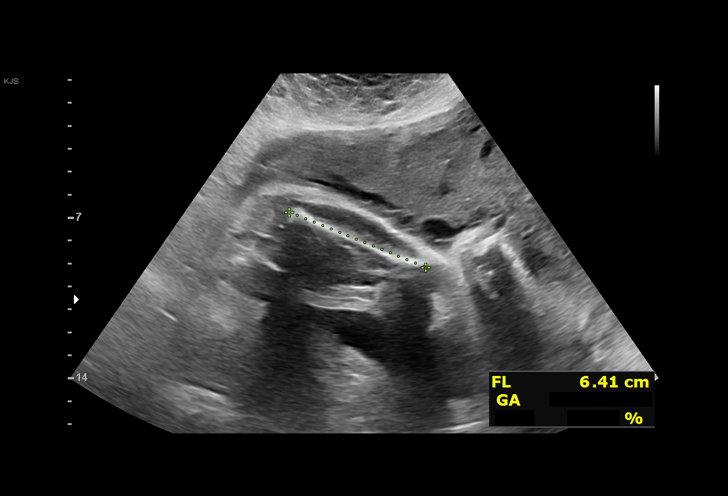
[im 12/46]
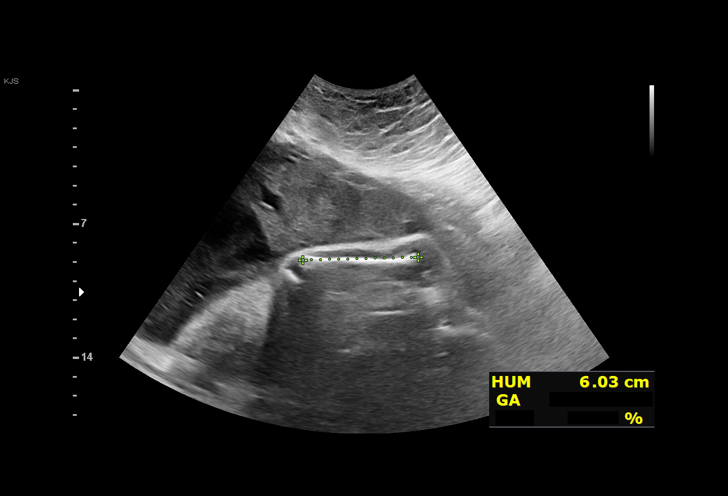
[im 16/46]
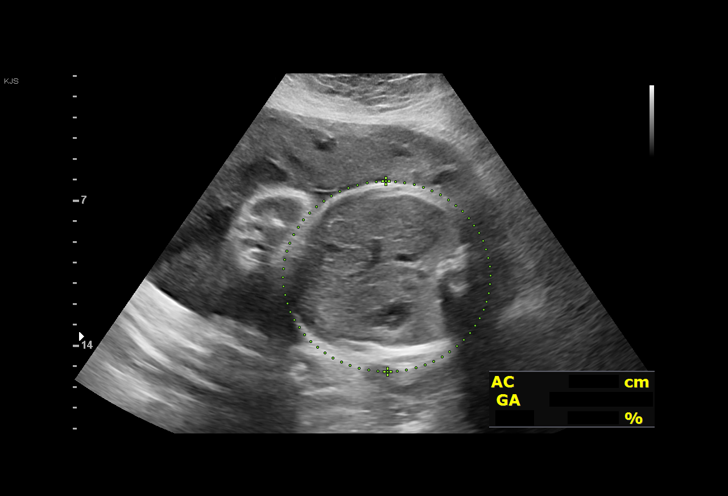
[im 19/46]
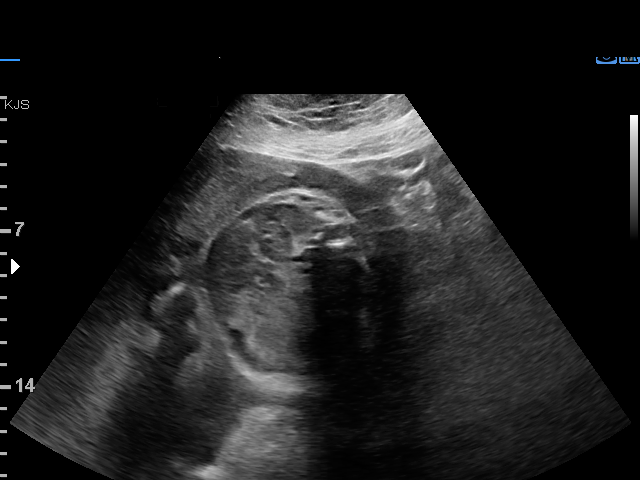
[im 22/46]
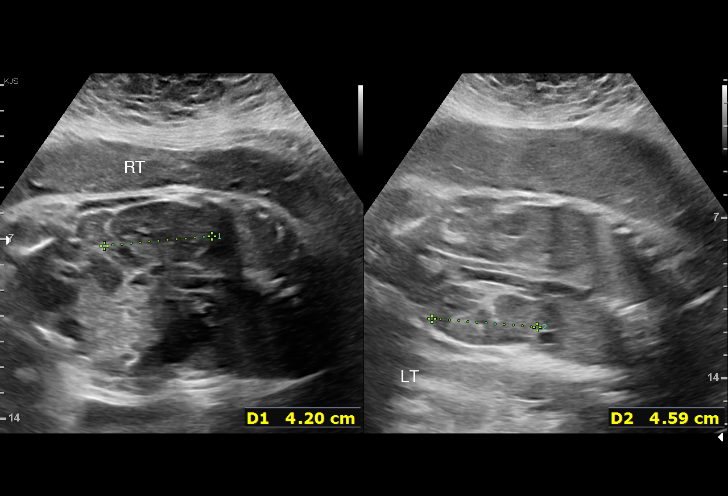
[im 26/46]
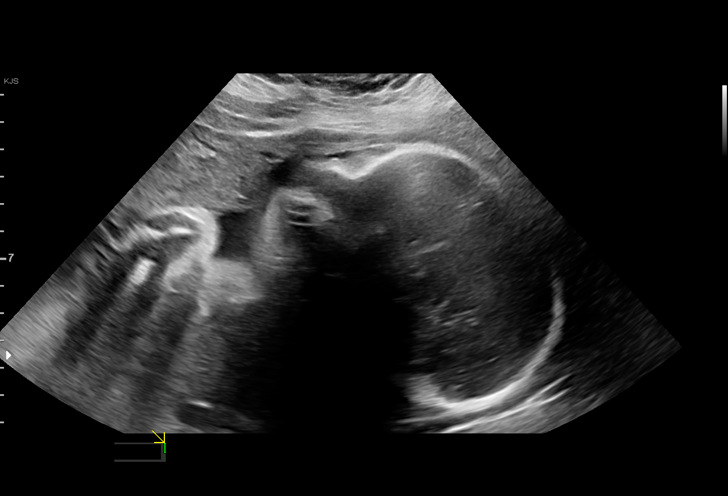
[im 29/46]
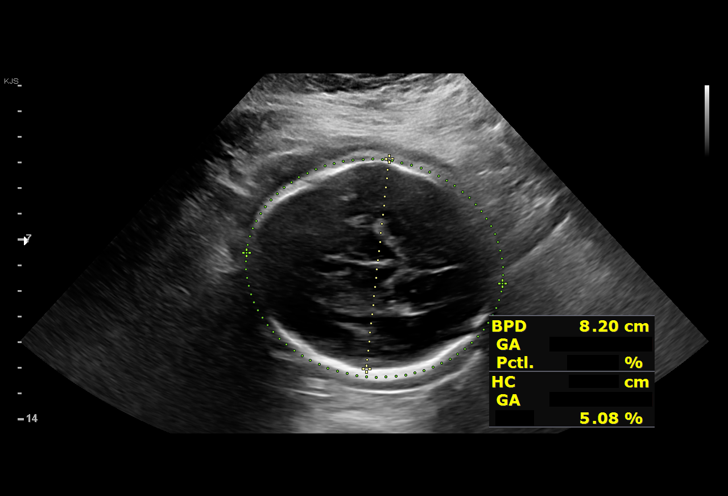
[im 32/46]
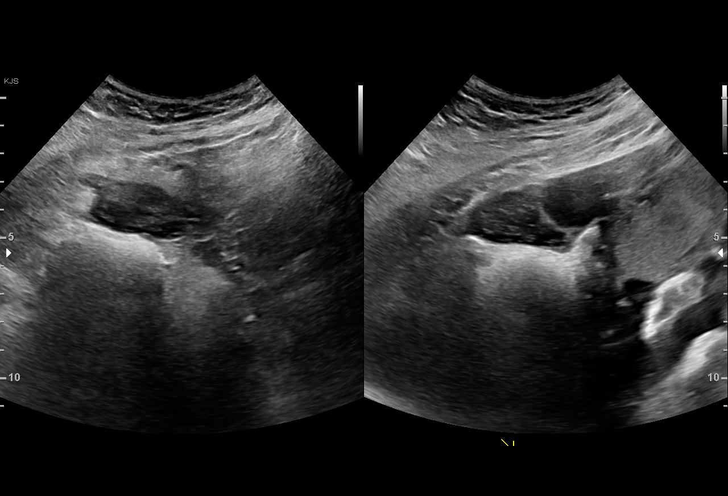
[im 36/46]
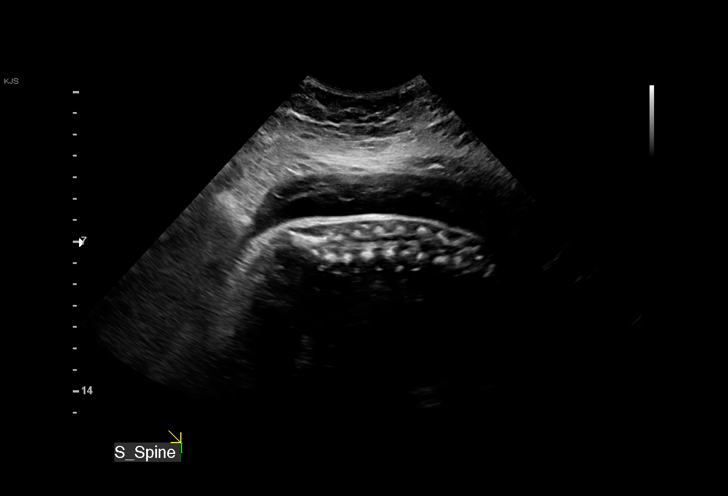
[im 39/46]
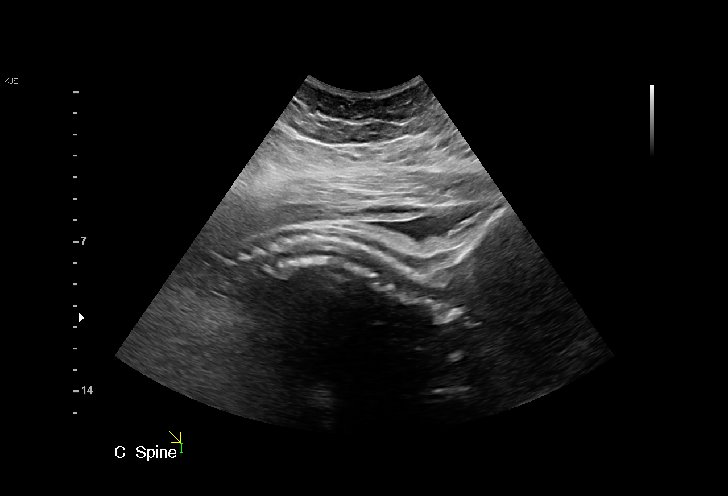
[im 42/46]
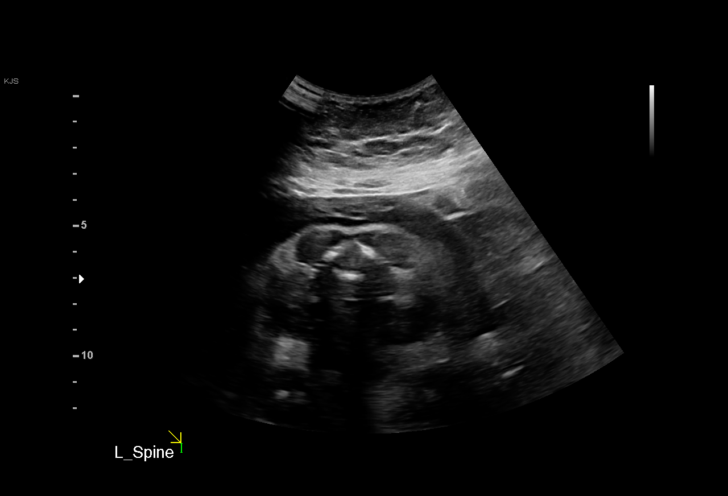
[im 46/46]
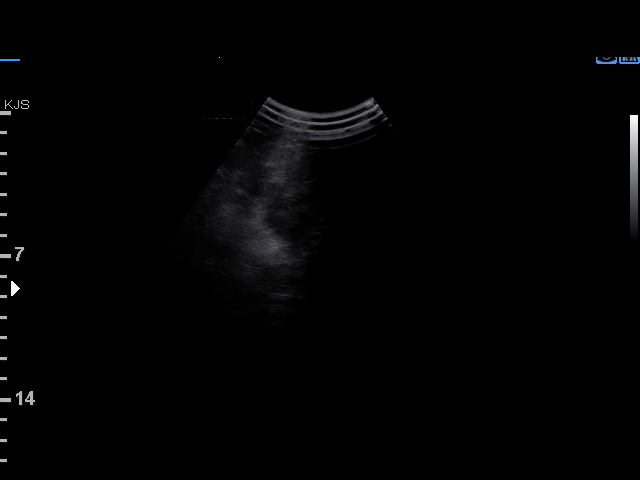

[14 of 28 positions shown; findings below may reference images not displayed]

[place]

Indications

 Substance abuse affecting pregnancy,
 antepartum (subutex)
 Encounter for other antenatal screening
 follow-up
 Obesity complicating pregnancy, third
 trimester (pregravid BMI 45)
 Medical complication of pregnancy (Elevated
 LFTs, Hep C)
 Previous cesarean delivery, antepartum
 Poor obstetrical history (Fetal demise at 22
 weeks)
 32 weeks gestation of pregnancy
Fetal Evaluation

 Num Of Fetuses:         1
 Cardiac Activity:       Observed
 Presentation:           Cephalic
 Placenta:               Anterior
 P. Cord Insertion:      Previously Visualized

 Amniotic Fluid
 AFI FV:      Within normal limits

 AFI Sum(cm)     %Tile       Largest Pocket(cm)
 9.88            17

 RUQ(cm)       RLQ(cm)       LUQ(cm)        LLQ(cm)

Biometry
 BPD:        82  mm     G. Age:  33w 0d         46  %    CI:        76.79   %    70 - 86
                                                         FL/HC:      21.6   %    19.9 -
 HC:      296.4  mm     G. Age:  32w 5d         14  %    HC/AC:      1.00        0.96 -
 AC:      296.9  mm     G. Age:  33w 5d         74  %    FL/BPD:     78.2   %    71 - 87
 FL:       64.1  mm     G. Age:  33w 1d         45  %    FL/AC:      21.6   %    20 - 24
 HUM:      57.9  mm     G. Age:  33w 4d         73  %
 LV:        3.6  mm

 Est. FW:    0967  gm    4 lb 13 oz      56  %
OB History

 Gravidity:    4         Term:   1        Prem:   0        SAB:   2
 TOP:          0       Ectopic:  0        Living: 1
Gestational Age

 U/S Today:     33w 1d                                        EDD:   10/15/20
 Best:          32w 6d     Det. By:  Early Ultrasound         EDD:   10/17/20
                                     (03/08/20)
Anatomy

 Cranium:               Appears normal         LVOT:                   Previously seen
 Cavum:                 Appears normal         Aortic Arch:            Previously seen
 Ventricles:            Appears normal         Ductal Arch:            Previously seen
 Choroid Plexus:        Previously seen        Diaphragm:              Appears normal
 Cerebellum:            Previously seen        Stomach:                Appears normal, left
                                                                       sided
 Posterior Fossa:       Previously seen        Abdomen:                Appears normal
 Nuchal Fold:           Not applicable (>20    Abdominal Wall:         Previously seen
                        wks GA)
 Face:                  Orbits and profile     Cord Vessels:           Previously seen
                        previously seen
 Lips:                  Previously seen        Kidneys:                Appear normal
 Palate:                Not well visualized    Bladder:                Appears normal
 Thoracic:              Appears normal         Spine:                  Appears normal
 Heart:                 Appears normal         Upper Extremities:      Previously seen
                        (4CH, axis, and
                        situs)
 RVOT:                  Previously seen        Lower Extremities:      Previously seen

 Other:  Female gender previously seen.Technically difficult due to maternal
         habitus and fetal position.
Cervix Uterus Adnexa

 Cervix
 Not visualized (advanced GA >14wks)

 Uterus
 No abnormality visualized.

 Right Ovary
 Within normal limits.
 Left Ovary
 Within normal limits.

 Cul De Sac
 No free fluid seen.

 Adnexa
 No abnormality visualized.
Comments

 This patient was seen for a follow up growth scan due to
 maternal obesity with a BMI of 45 and treatment with
 Subutex.  She denies any problems since her last exam.
 She was informed that the fetal growth and amniotic fluid
 level appears appropriate for her gestational age.
 As her BMI is 45, we will start weekly biophysical profiles at
 around 36 weeks.
 A biophysical profile was scheduled in 3 weeks.  Another
 growth scan was scheduled for her in 4 weeks.

## 2021-12-24 MED ORDER — LISDEXAMFETAMINE DIMESYLATE 40 MG PO CAPS
40.0000 mg | ORAL_CAPSULE | Freq: Every morning | ORAL | 0 refills | Status: AC
  Filled 2021-12-24: qty 30, 30d supply, fill #0

## 2022-01-07 ENCOUNTER — Other Ambulatory Visit (HOSPITAL_COMMUNITY): Payer: Self-pay

## 2022-01-15 ENCOUNTER — Other Ambulatory Visit (HOSPITAL_COMMUNITY): Payer: Self-pay

## 2022-01-19 ENCOUNTER — Other Ambulatory Visit (HOSPITAL_COMMUNITY): Payer: Self-pay

## 2022-05-14 ENCOUNTER — Other Ambulatory Visit (HOSPITAL_COMMUNITY): Payer: Self-pay

## 2022-05-14 MED ORDER — WEGOVY 0.25 MG/0.5ML ~~LOC~~ SOAJ
0.2500 mg | SUBCUTANEOUS | 0 refills | Status: DC
  Filled 2022-05-14: qty 2, 28d supply, fill #0

## 2022-05-22 ENCOUNTER — Other Ambulatory Visit (HOSPITAL_COMMUNITY): Payer: Self-pay

## 2022-06-12 ENCOUNTER — Other Ambulatory Visit (HOSPITAL_COMMUNITY): Payer: Self-pay

## 2022-06-12 MED ORDER — WEGOVY 1 MG/0.5ML ~~LOC~~ SOAJ
1.0000 mg | SUBCUTANEOUS | 0 refills | Status: AC
  Filled 2022-07-19: qty 2, 28d supply, fill #0

## 2022-06-12 MED ORDER — WEGOVY 0.5 MG/0.5ML ~~LOC~~ SOAJ
0.5000 mg | SUBCUTANEOUS | 0 refills | Status: AC
  Filled 2022-06-12 – 2022-08-14 (×5): qty 2, 28d supply, fill #0

## 2022-06-13 ENCOUNTER — Other Ambulatory Visit (HOSPITAL_COMMUNITY): Payer: Self-pay

## 2022-06-18 ENCOUNTER — Other Ambulatory Visit (HOSPITAL_COMMUNITY): Payer: Self-pay

## 2022-06-18 ENCOUNTER — Other Ambulatory Visit: Payer: Self-pay

## 2022-06-18 MED ORDER — WEGOVY 0.25 MG/0.5ML ~~LOC~~ SOAJ
0.2500 mg | SUBCUTANEOUS | 0 refills | Status: DC
  Filled 2022-06-18: qty 2, 28d supply, fill #0

## 2022-06-27 ENCOUNTER — Other Ambulatory Visit (HOSPITAL_COMMUNITY): Payer: Self-pay

## 2022-07-19 ENCOUNTER — Other Ambulatory Visit (HOSPITAL_COMMUNITY): Payer: Self-pay

## 2022-07-19 MED ORDER — METHYLPHENIDATE HCL ER (OSM) 54 MG PO TBCR
54.0000 mg | EXTENDED_RELEASE_TABLET | Freq: Every morning | ORAL | 0 refills | Status: AC
  Filled 2022-07-19: qty 30, 30d supply, fill #0

## 2022-07-19 MED ORDER — METHYLPHENIDATE HCL ER (OSM) 54 MG PO TBCR
54.0000 mg | EXTENDED_RELEASE_TABLET | Freq: Every morning | ORAL | 0 refills | Status: AC
  Filled 2022-08-17: qty 30, 30d supply, fill #0

## 2022-07-19 MED ORDER — METHYLPHENIDATE HCL ER (OSM) 54 MG PO TBCR
54.0000 mg | EXTENDED_RELEASE_TABLET | Freq: Every morning | ORAL | 0 refills | Status: AC
  Filled 2022-11-18 (×2): qty 30, 30d supply, fill #0

## 2022-07-21 ENCOUNTER — Emergency Department (HOSPITAL_COMMUNITY)
Admission: EM | Admit: 2022-07-21 | Discharge: 2022-07-21 | Disposition: A | Discharge status: Home / Self Care | Payer: 59 | Attending: Emergency Medicine | Admitting: Emergency Medicine

## 2022-07-21 ENCOUNTER — Emergency Department (HOSPITAL_COMMUNITY): Payer: 59

## 2022-07-21 ENCOUNTER — Other Ambulatory Visit (HOSPITAL_COMMUNITY): Payer: Self-pay

## 2022-07-21 ENCOUNTER — Encounter (HOSPITAL_COMMUNITY): Payer: Self-pay

## 2022-07-21 DIAGNOSIS — R197 Diarrhea, unspecified: Secondary | ICD-10-CM | POA: Insufficient documentation

## 2022-07-21 DIAGNOSIS — R1084 Generalized abdominal pain: Secondary | ICD-10-CM

## 2022-07-21 DIAGNOSIS — Z79899 Other long term (current) drug therapy: Secondary | ICD-10-CM | POA: Diagnosis not present

## 2022-07-21 DIAGNOSIS — R109 Unspecified abdominal pain: Secondary | ICD-10-CM | POA: Diagnosis not present

## 2022-07-21 DIAGNOSIS — R112 Nausea with vomiting, unspecified: Secondary | ICD-10-CM | POA: Insufficient documentation

## 2022-07-21 LAB — CBC WITH DIFFERENTIAL/PLATELET
Abs Immature Granulocytes: 0.05 10*3/uL (ref 0.00–0.07)
Basophils Absolute: 0 10*3/uL (ref 0.0–0.1)
Basophils Relative: 0 %
Eosinophils Absolute: 0 10*3/uL (ref 0.0–0.5)
Eosinophils Relative: 0 %
HCT: 39.6 % (ref 36.0–46.0)
Hemoglobin: 14.1 g/dL (ref 12.0–15.0)
Immature Granulocytes: 1 %
Lymphocytes Relative: 9 %
Lymphs Abs: 0.9 10*3/uL (ref 0.7–4.0)
MCH: 31.8 pg (ref 26.0–34.0)
MCHC: 35.6 g/dL (ref 30.0–36.0)
MCV: 89.4 fL (ref 80.0–100.0)
Monocytes Absolute: 0.3 10*3/uL (ref 0.1–1.0)
Monocytes Relative: 3 %
Neutro Abs: 8.9 10*3/uL — ABNORMAL HIGH (ref 1.7–7.7)
Neutrophils Relative %: 87 %
Platelets: 156 10*3/uL (ref 150–400)
RBC: 4.43 MIL/uL (ref 3.87–5.11)
RDW: 12.3 % (ref 11.5–15.5)
WBC: 10.2 10*3/uL (ref 4.0–10.5)
nRBC: 0 % (ref 0.0–0.2)

## 2022-07-21 LAB — LIPASE, BLOOD: Lipase: 30 U/L (ref 11–51)

## 2022-07-21 LAB — COMPREHENSIVE METABOLIC PANEL
ALT: 108 U/L — ABNORMAL HIGH (ref 0–44)
AST: 65 U/L — ABNORMAL HIGH (ref 15–41)
Albumin: 4.5 g/dL (ref 3.5–5.0)
Alkaline Phosphatase: 73 U/L (ref 38–126)
Anion gap: 8 (ref 5–15)
BUN: 12 mg/dL (ref 6–20)
CO2: 23 mmol/L (ref 22–32)
Calcium: 9.3 mg/dL (ref 8.9–10.3)
Chloride: 105 mmol/L (ref 98–111)
Creatinine, Ser: 0.84 mg/dL (ref 0.44–1.00)
GFR, Estimated: >60 mL/min (ref >60)
Glucose, Bld: 124 mg/dL — ABNORMAL HIGH (ref 70–99)
Potassium: 3.7 mmol/L (ref 3.5–5.1)
Sodium: 136 mmol/L (ref 135–145)
Total Bilirubin: 1.1 mg/dL (ref 0.3–1.2)
Total Protein: 8.3 g/dL — ABNORMAL HIGH (ref 6.5–8.1)

## 2022-07-21 LAB — URINALYSIS, ROUTINE W REFLEX MICROSCOPIC
Bilirubin Urine: NEGATIVE
Glucose, UA: NEGATIVE mg/dL
Hgb urine dipstick: NEGATIVE
Ketones, ur: 5 mg/dL — AB
Leukocytes,Ua: NEGATIVE
Nitrite: NEGATIVE
Protein, ur: NEGATIVE mg/dL
Specific Gravity, Urine: 1.014 (ref 1.005–1.030)
pH: 7 (ref 5.0–8.0)

## 2022-07-21 LAB — I-STAT BETA HCG BLOOD, ED (MC, WL, AP ONLY): I-stat hCG, quantitative: <5 m[IU]/mL (ref <5)

## 2022-07-21 MED ORDER — SODIUM CHLORIDE 0.9 % IV BOLUS
1000.0000 mL | Freq: Once | INTRAVENOUS | Status: AC
  Administered 2022-07-21: 1000 mL via INTRAVENOUS

## 2022-07-21 MED ORDER — ONDANSETRON HCL 4 MG PO TABS
4.0000 mg | ORAL_TABLET | Freq: Four times a day (QID) | ORAL | 0 refills | Status: AC
  Filled 2022-07-21: qty 12, 3d supply, fill #0

## 2022-07-21 MED ORDER — MORPHINE SULFATE (PF) 4 MG/ML IV SOLN
4.0000 mg | Freq: Once | INTRAVENOUS | Status: AC
  Administered 2022-07-21: 4 mg via INTRAVENOUS
  Filled 2022-07-21: qty 1

## 2022-07-21 MED ORDER — ONDANSETRON HCL 4 MG/2ML IJ SOLN
4.0000 mg | Freq: Once | INTRAMUSCULAR | Status: AC
  Administered 2022-07-21: 4 mg via INTRAVENOUS
  Filled 2022-07-21: qty 2

## 2022-07-21 MED ORDER — HYDROMORPHONE HCL 1 MG/ML IJ SOLN
1.0000 mg | Freq: Once | INTRAMUSCULAR | Status: AC
  Administered 2022-07-21: 1 mg via INTRAVENOUS
  Filled 2022-07-21: qty 1

## 2022-07-21 MED ORDER — DICYCLOMINE HCL 10 MG PO CAPS
10.0000 mg | ORAL_CAPSULE | Freq: Once | ORAL | Status: AC
  Administered 2022-07-21: 10 mg via ORAL
  Filled 2022-07-21: qty 1

## 2022-07-21 MED ORDER — DICYCLOMINE HCL 20 MG PO TABS
20.0000 mg | ORAL_TABLET | Freq: Two times a day (BID) | ORAL | 0 refills | Status: AC
  Filled 2022-07-21: qty 20, 10d supply, fill #0

## 2022-07-21 MED ORDER — IOHEXOL 300 MG/ML  SOLN
100.0000 mL | Freq: Once | INTRAMUSCULAR | Status: AC | PRN
  Administered 2022-07-21: 100 mL via INTRAVENOUS

## 2022-07-21 MED ORDER — SODIUM CHLORIDE (PF) 0.9 % IJ SOLN
INTRAMUSCULAR | Status: AC
  Filled 2022-07-21: qty 50

## 2022-07-21 NOTE — ED Provider Notes (Signed)
[place] EMERGENCY DEPARTMENT AT [facility] Provider Note   CSN: [id number] Arrival date & time: 07/21/22  1054     History  Chief Complaint  Patient presents with   Abdominal Pain    Abigail Fox is a 37 y.o. female.  Patient with noncontributory past medical history presents today with complaints of abdominal pain, nausea, vomiting, and diarrhea. She states that she increased her dose of Wegovy on Friday and since then has had generalized abdominal pain that has been becoming progressively worse. Pain is not localized and is throughout her abdomen. Endorses associated nausea, vomiting, and diarrhea. Denies urinary symptoms or vaginal discharge. Denies hematemesis, hematochezia, or melena. History of c section. Denies fevers or chills. No sick contacts. No history of similar symptoms previously.   The history is provided by the patient. No language interpreter was used.  Abdominal Pain Associated symptoms: diarrhea, nausea and vomiting        Home Medications Prior to Admission medications   Medication Sig Start Date End Date Taking? Authorizing Provider  acetaminophen (TYLENOL) 500 MG tablet Take 500 mg by mouth every 6 (six) hours as needed.    [provider]  benazepril (LOTENSIN) 10 MG tablet Take 10 mg by mouth daily.    [provider]  buprenorphine (SUBUTEX) 8 MG SUBL SL tablet Place 8 mg under the tongue 2 (two) times daily. 10/23/16   [provider]  ferrous sulfate 325 (65 FE) MG tablet Take 325 mg by mouth daily with breakfast.    [provider]  furosemide (LASIX) 40 MG tablet Take 1 tablet (40 mg total) by mouth as needed for edema. 11/06/20 11/06/21  Jacklynn Ganong, FNP  hydrochlorothiazide (MICROZIDE) 12.5 MG capsule Take 1 capsule (12.5 mg total) by mouth daily. 10/28/20 10/28/21  Holshouser, Gerhard Munch, CNM  ibuprofen (ADVIL) 800 MG tablet Take 1 tablet (800 mg total) by mouth every 8 (eight) hours as needed.  10/14/20   Steva Ready, DO  lisdexamfetamine (VYVANSE) 40 MG capsule TAKE 1 CAPSULE BY MOUTH ONCE EVERY MORNING. 12/24/21     methylphenidate (CONCERTA) 54 MG PO CR tablet Take 1 tablet (54 mg total) by mouth every morning (fill 09/15/22) 09/15/22     methylphenidate (CONCERTA) 54 MG PO CR tablet Take 1 tablet (54 mg total) by mouth every morning (fill 08/17/22) 08/17/22     methylphenidate (CONCERTA) 54 MG PO CR tablet Take 1 tablet (54 mg total) by mouth every morning. 07/19/22     potassium chloride SA (KLOR-CON) 20 MEQ tablet Take 1 tablet (20 mEq total) by mouth daily. 10/28/20   Gerald Leitz, MD  Prenatal Vit-Fe Fumarate-FA (PRENATAL MULTIVITAMIN) TABS tablet Take 1 tablet by mouth daily at 12 noon.    [provider]  Semaglutide-Weight Management (WEGOVY) 0.25 MG/0.5ML SOAJ Inject 0.25 mg into the skin once a week. 06/18/22     Semaglutide-Weight Management (WEGOVY) 0.5 MG/0.5ML SOAJ Inject 0.5 mg into the skin once a week. 06/12/22     Semaglutide-Weight Management (WEGOVY) 1 MG/0.5ML SOAJ Inject 1 mg into the skin once a week. 06/12/22         Allergies    Patient has no known allergies.    Review of Systems   Review of Systems  Gastrointestinal:  Positive for abdominal pain, diarrhea, nausea and vomiting.  All other systems reviewed and are negative.   Physical Exam Updated Vital Signs BP (!) 148/101   Pulse 98   Temp 98.4 F (36.9 C) (  Oral)   Resp 20   Ht 5\' 4"  (1.626 m)   Wt 104.3 kg   LMP 07/03/2022 Comment: negative beta HCG 07/21/22  SpO2 93%   BMI 39.48 kg/m  Physical Exam Vitals and nursing note reviewed.  Constitutional:      General: She is not in acute distress.    Appearance: Normal appearance. She is normal weight. She is not ill-appearing, toxic-appearing or diaphoretic.  HENT:     Head: Normocephalic and atraumatic.  Cardiovascular:     Rate and Rhythm: Normal rate.  Pulmonary:     Effort: Pulmonary effort is normal. No respiratory distress.  Abdominal:      General: Abdomen is flat.     Palpations: Abdomen is soft.     Tenderness: There is generalized abdominal tenderness.  Musculoskeletal:        General: Normal range of motion.     Cervical back: Normal range of motion.  Skin:    General: Skin is warm and dry.  Neurological:     General: No focal deficit present.     Mental Status: She is alert.  Psychiatric:        Mood and Affect: Mood normal.        Behavior: Behavior normal.     ED Results / Procedures / Treatments   Labs (all labs ordered are listed, but only abnormal results are displayed) Labs Reviewed  COMPREHENSIVE METABOLIC PANEL - Abnormal; Notable for the following components:      Result Value   Glucose, Bld 124 (*)    Total Protein 8.3 (*)    AST 65 (*)    ALT 108 (*)    All other components within normal limits  CBC WITH DIFFERENTIAL/PLATELET - Abnormal; Notable for the following components:   Neutro Abs 8.9 (*)    All other components within normal limits  URINALYSIS, ROUTINE W REFLEX MICROSCOPIC - Abnormal; Notable for the following components:   Ketones, ur 5 (*)    All other components within normal limits  LIPASE, BLOOD  I-STAT BETA HCG BLOOD, ED ([facility], [facility], [facility] ONLY)    EKG None  Radiology No results found.  Procedures Procedures    Medications Ordered in ED Medications  ondansetron (ZOFRAN) injection 4 mg (4 mg Intravenous Given 07/21/22 1158)  sodium chloride 0.9 % bolus 1,000 mL (0 mLs Intravenous Stopped 07/21/22 1507)  morphine (PF) 4 MG/ML injection 4 mg (4 mg Intravenous Given 07/21/22 1215)  HYDROmorphone (DILAUDID) injection 1 mg (1 mg Intravenous Given 07/21/22 1408)  iohexol (OMNIPAQUE) 300 MG/ML solution 100 mL (100 mLs Intravenous Contrast Given 07/21/22 1527)    ED Course/ Medical Decision Making/ A&P                             Medical Decision Making Amount and/or Complexity of Data Reviewed Labs: ordered. Radiology: ordered.  Risk Prescription drug  management.   This patient is a 37 y.o. female who presents to the ED for concern of abdominal pain, this involves an extensive number of treatment options, and is a complaint that carries with it a high risk of complications and morbidity. The emergent differential diagnosis prior to evaluation includes, but is not limited to,  gastroenteritis, appendicitis, Bowel obstruction, Bowel perforation. Gastroparesis, DKA, Hernia, Inflammatory bowel disease, mesenteric ischemia, pancreatitis, peritonitis SBP, volvulus.  This is not an exhaustive differential.   Past Medical History / Co-morbidities / Social History: Hx c section  Physical Exam: Physical exam performed. The pertinent findings include: generalized abdominal TTP  Lab Tests: I ordered, and personally interpreted labs.  The pertinent results include:  no acute laboratory findings   Imaging Studies: I ordered imaging studies including CT abdomen pelvis. This has resulted and reveals  No acute findings  I have personally reviewed and interpreted this imaging and agree with radiology interpretation.   Medications: I ordered medication including dilaudid, morphine, zofran, and fluids  for pain, nausea, vomiting, dehydration. Reevaluation of the patient after these medicines showed that the patient improved. I have reviewed the patients home medicines and have made adjustments as needed.   Disposition:  Patient presents today with complaints of generalized abdominal pain.  She is afebrile, nontoxic-appearing, and in no acute distress with reassuring vital signs. Labs and CT are reassuring, however upon reassessment she is still uncomfortable and feeling nauseous. Therefore zofran and bentyl ordered. Will need reassessment, if symptomatic control she will be stable for discharge with meds and outpatient follow-up.  Care handoff to [facility], PA-C at shift change. Please see their note for further evaluation and dispo   Final  Clinical Impression(s) / ED Diagnoses Final diagnoses:  Generalized abdominal pain  Nausea vomiting and diarrhea    Rx / DC Orders ED Discharge Orders     None         Vear Clock 07/21/22 1628    Mardene Sayer, MD 07/21/22 1746

## 2022-07-21 NOTE — ED Triage Notes (Signed)
Pt states she increased her Wegovy on Friday to 1.0. Pt states she has generalized abd pain, as well as nausea and vomiting. Pt has tried increasing fluid intake without relief.

## 2022-07-21 NOTE — ED Provider Notes (Signed)
37 year old presenting today due to abdominal pain and nausea.  Recent change in her Wegovy dose.  Seen by previous provider, please see her note for original workup and history thus far.  CT was negative.  Lab work was also within normal limits.  Patient continues to have severe pain despite morphine, Dilaudid and Zofran.  She will now get Bentyl and another round of Zofran.     PositivePhysical Exam  BP (!) 148/101   Pulse 98   Temp 98.4 F (36.9 C) (Oral)   Resp 20   Ht 5\' 4"  (1.626 m)   Wt 104.3 kg   LMP 07/03/2022 Comment: negative beta HCG 07/21/22  SpO2 93%   BMI 39.48 kg/m   Physical Exam Vitals and nursing note reviewed.  Constitutional:      Appearance: Normal appearance.  HENT:     Head: Normocephalic and atraumatic.  Eyes:     General: No scleral icterus.    Conjunctiva/sclera: Conjunctivae normal.  Pulmonary:     Effort: Pulmonary effort is normal. No respiratory distress.  Skin:    Findings: No rash.  Neurological:     Mental Status: She is alert.  Psychiatric:        Mood and Affect: Mood normal.     Procedures  Procedures  ED Course / MDM    Medical Decision Making Amount and/or Complexity of Data Reviewed Labs: ordered. Radiology: ordered.  Risk Prescription drug management.    Reassessed and reported feeling better.  Requested Bentyl for home.  Discharged with Bentyl and Zofran     Saddie Benders, PA-C 07/21/22 1819    Gloris Manchester, MD 07/24/22 0005

## 2022-07-21 NOTE — Discharge Instructions (Addendum)
Talk to your PCP about your [facility].  I sent Bentyl and Zofran to your pharmacy.  Do not hesitate to return with any worsening symptoms.

## 2022-07-22 ENCOUNTER — Other Ambulatory Visit (HOSPITAL_COMMUNITY): Payer: Self-pay

## 2022-08-12 ENCOUNTER — Other Ambulatory Visit (HOSPITAL_COMMUNITY): Payer: Self-pay

## 2022-08-13 ENCOUNTER — Other Ambulatory Visit (HOSPITAL_COMMUNITY): Payer: Self-pay

## 2022-08-13 ENCOUNTER — Other Ambulatory Visit: Payer: Self-pay

## 2022-08-13 MED ORDER — WEGOVY 0.25 MG/0.5ML ~~LOC~~ SOAJ
0.2500 mg | SUBCUTANEOUS | 0 refills | Status: AC
  Filled 2022-08-13: qty 2, 28d supply, fill #0

## 2022-08-14 ENCOUNTER — Other Ambulatory Visit (HOSPITAL_COMMUNITY): Payer: Self-pay

## 2022-08-17 ENCOUNTER — Other Ambulatory Visit (HOSPITAL_COMMUNITY): Payer: Self-pay

## 2022-09-13 ENCOUNTER — Other Ambulatory Visit (HOSPITAL_COMMUNITY): Payer: Self-pay

## 2022-11-18 ENCOUNTER — Other Ambulatory Visit (HOSPITAL_COMMUNITY): Payer: Self-pay

## 2022-11-19 ENCOUNTER — Other Ambulatory Visit (HOSPITAL_COMMUNITY): Payer: Self-pay

## 2022-11-19 MED ORDER — METHYLPHENIDATE HCL ER (OSM) 54 MG PO TBCR
54.0000 mg | EXTENDED_RELEASE_TABLET | Freq: Every morning | ORAL | 0 refills | Status: AC
  Filled 2022-11-19 – 2023-01-02 (×2): qty 30, 30d supply, fill #0

## 2023-01-02 ENCOUNTER — Other Ambulatory Visit (HOSPITAL_COMMUNITY): Payer: Self-pay

## 2023-02-06 ENCOUNTER — Other Ambulatory Visit (HOSPITAL_COMMUNITY): Payer: Self-pay

## 2023-02-06 MED ORDER — INFLUENZA VIRUS VACC SPLIT PF (FLUZONE) 0.5 ML IM SUSY
0.5000 mL | PREFILLED_SYRINGE | Freq: Once | INTRAMUSCULAR | 0 refills | Status: AC
  Filled 2023-02-06: qty 0.5, 1d supply, fill #0

## 2023-05-06 DIAGNOSIS — R41840 Attention and concentration deficit: Secondary | ICD-10-CM | POA: Diagnosis not present

## 2023-05-06 DIAGNOSIS — F909 Attention-deficit hyperactivity disorder, unspecified type: Secondary | ICD-10-CM | POA: Diagnosis not present

## 2023-05-06 DIAGNOSIS — F411 Generalized anxiety disorder: Secondary | ICD-10-CM | POA: Diagnosis not present
